# Patient Record
Sex: Female | Born: 1950 | Race: White | Hispanic: No | Smoking: Former smoker
Health system: Southern US, Community
[De-identification: ages and names within clinical notes are randomized; demographics above are authoritative.]

## PROBLEM LIST (undated history)

## (undated) DIAGNOSIS — Z973 Presence of spectacles and contact lenses: Secondary | ICD-10-CM

## (undated) DIAGNOSIS — M549 Dorsalgia, unspecified: Secondary | ICD-10-CM

## (undated) DIAGNOSIS — M2550 Pain in unspecified joint: Secondary | ICD-10-CM

## (undated) DIAGNOSIS — M5430 Sciatica, unspecified side: Secondary | ICD-10-CM

## (undated) DIAGNOSIS — Z86010 Personal history of colonic polyps: Secondary | ICD-10-CM

## (undated) DIAGNOSIS — E559 Vitamin D deficiency, unspecified: Secondary | ICD-10-CM

## (undated) DIAGNOSIS — Z923 Personal history of irradiation: Secondary | ICD-10-CM

## (undated) DIAGNOSIS — I471 Supraventricular tachycardia, unspecified: Secondary | ICD-10-CM

## (undated) DIAGNOSIS — Z860101 Personal history of adenomatous and serrated colon polyps: Secondary | ICD-10-CM

## (undated) DIAGNOSIS — H3530 Unspecified macular degeneration: Secondary | ICD-10-CM

## (undated) DIAGNOSIS — Z8619 Personal history of other infectious and parasitic diseases: Secondary | ICD-10-CM

## (undated) DIAGNOSIS — J189 Pneumonia, unspecified organism: Secondary | ICD-10-CM

## (undated) DIAGNOSIS — R002 Palpitations: Secondary | ICD-10-CM

## (undated) DIAGNOSIS — M1990 Unspecified osteoarthritis, unspecified site: Secondary | ICD-10-CM

## (undated) DIAGNOSIS — I499 Cardiac arrhythmia, unspecified: Secondary | ICD-10-CM

## (undated) HISTORY — DX: Palpitations: R00.2

## (undated) HISTORY — DX: Dorsalgia, unspecified: M54.9

## (undated) HISTORY — PX: OTHER SURGICAL HISTORY: SHX169

## (undated) HISTORY — DX: Pain in unspecified joint: M25.50

## (undated) HISTORY — DX: Sciatica, unspecified side: M54.30

## (undated) HISTORY — PX: APPENDECTOMY: SHX54

## (undated) HISTORY — DX: Personal history of other infectious and parasitic diseases: Z86.19

## (undated) HISTORY — PX: COLONOSCOPY: SHX174

## (undated) HISTORY — PX: AUGMENTATION MAMMAPLASTY: SUR837

## (undated) HISTORY — DX: Supraventricular tachycardia: I47.1

---

## 1969-01-13 HISTORY — PX: APPENDECTOMY: SHX54

## 1999-11-27 ENCOUNTER — Other Ambulatory Visit: Admission: RE | Admit: 1999-11-27 | Discharge: 1999-11-27 | Payer: Self-pay | Admitting: Obstetrics and Gynecology

## 1999-12-10 ENCOUNTER — Encounter: Payer: Self-pay | Admitting: Emergency Medicine

## 1999-12-10 ENCOUNTER — Emergency Department (HOSPITAL_COMMUNITY): Admission: EM | Admit: 1999-12-10 | Discharge: 1999-12-10 | Payer: Self-pay | Admitting: Emergency Medicine

## 2000-01-13 ENCOUNTER — Other Ambulatory Visit: Admission: RE | Admit: 2000-01-13 | Discharge: 2000-01-13 | Payer: Self-pay | Admitting: Plastic Surgery

## 2000-01-13 ENCOUNTER — Encounter: Admission: RE | Admit: 2000-01-13 | Discharge: 2000-01-13 | Payer: Self-pay | Admitting: Plastic Surgery

## 2000-01-13 ENCOUNTER — Encounter: Payer: Self-pay | Admitting: Plastic Surgery

## 2000-01-14 HISTORY — PX: BREAST ENHANCEMENT SURGERY: SHX7

## 2000-12-29 ENCOUNTER — Other Ambulatory Visit: Admission: RE | Admit: 2000-12-29 | Discharge: 2000-12-29 | Payer: Self-pay | Admitting: Obstetrics and Gynecology

## 2003-12-26 ENCOUNTER — Ambulatory Visit (HOSPITAL_COMMUNITY): Admission: RE | Admit: 2003-12-26 | Discharge: 2003-12-26 | Payer: Self-pay | Admitting: Surgery

## 2008-03-07 ENCOUNTER — Ambulatory Visit: Payer: Self-pay

## 2010-06-20 ENCOUNTER — Ambulatory Visit: Payer: Self-pay | Admitting: Orthopedic Surgery

## 2013-07-13 DIAGNOSIS — I471 Supraventricular tachycardia: Secondary | ICD-10-CM

## 2013-07-13 HISTORY — DX: Supraventricular tachycardia: I47.1

## 2013-07-18 ENCOUNTER — Emergency Department: Payer: Self-pay | Admitting: Emergency Medicine

## 2013-07-18 LAB — CBC
HCT: 41.3 % (ref 35.0–47.0)
HGB: 13.6 g/dL (ref 12.0–16.0)
MCH: 31 pg (ref 26.0–34.0)
MCHC: 32.8 g/dL (ref 32.0–36.0)
MCV: 94 fL (ref 80–100)
PLATELETS: 196 10*3/uL (ref 150–440)
RBC: 4.38 10*6/uL (ref 3.80–5.20)
RDW: 13.3 % (ref 11.5–14.5)
WBC: 7 10*3/uL (ref 3.6–11.0)

## 2013-07-18 LAB — COMPREHENSIVE METABOLIC PANEL
ALT: 34 U/L (ref 12–78)
ANION GAP: 6 — AB (ref 7–16)
AST: 35 U/L (ref 15–37)
Albumin: 3.7 g/dL (ref 3.4–5.0)
Alkaline Phosphatase: 79 U/L
BUN: 7 mg/dL (ref 7–18)
Bilirubin,Total: 0.7 mg/dL (ref 0.2–1.0)
CALCIUM: 9.3 mg/dL (ref 8.5–10.1)
CO2: 28 mmol/L (ref 21–32)
CREATININE: 0.94 mg/dL (ref 0.60–1.30)
Chloride: 101 mmol/L (ref 98–107)
EGFR (Non-African Amer.): >60
Glucose: 105 mg/dL — ABNORMAL HIGH (ref 65–99)
Osmolality: 268 (ref 275–301)
Potassium: 4.1 mmol/L (ref 3.5–5.1)
Sodium: 135 mmol/L — ABNORMAL LOW (ref 136–145)
Total Protein: 7.5 g/dL (ref 6.4–8.2)

## 2013-07-18 LAB — TROPONIN I: Troponin-I: <0.02 ng/mL

## 2013-07-18 LAB — D-DIMER(ARMC): D-Dimer: 312 ng/ml

## 2013-07-19 ENCOUNTER — Encounter: Payer: Self-pay | Admitting: Cardiovascular Disease

## 2013-07-19 ENCOUNTER — Ambulatory Visit (INDEPENDENT_AMBULATORY_CARE_PROVIDER_SITE_OTHER): Payer: 59 | Admitting: Cardiovascular Disease

## 2013-07-19 ENCOUNTER — Other Ambulatory Visit (INDEPENDENT_AMBULATORY_CARE_PROVIDER_SITE_OTHER): Payer: 59

## 2013-07-19 ENCOUNTER — Encounter: Payer: Self-pay | Admitting: *Deleted

## 2013-07-19 VITALS — BP 120/86 | HR 83 | Ht 62.0 in | Wt 161.8 lb

## 2013-07-19 DIAGNOSIS — R0602 Shortness of breath: Secondary | ICD-10-CM

## 2013-07-19 DIAGNOSIS — R079 Chest pain, unspecified: Secondary | ICD-10-CM

## 2013-07-19 DIAGNOSIS — I498 Other specified cardiac arrhythmias: Secondary | ICD-10-CM

## 2013-07-19 DIAGNOSIS — I471 Supraventricular tachycardia, unspecified: Secondary | ICD-10-CM

## 2013-07-19 DIAGNOSIS — R0609 Other forms of dyspnea: Secondary | ICD-10-CM

## 2013-07-19 DIAGNOSIS — R0989 Other specified symptoms and signs involving the circulatory and respiratory systems: Secondary | ICD-10-CM

## 2013-07-19 DIAGNOSIS — R0600 Dyspnea, unspecified: Secondary | ICD-10-CM

## 2013-07-19 NOTE — Progress Notes (Signed)
HPI  This is a pleasant 63 year old female who was referred from the emergency room at [facility] for evaluation of chest pain. 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 She reports remote history of paroxysmal supraventricular tachycardia previously treated with as needed metoprolol. She has no history of diabetes, hypertension or hyperlipidemia. She is a previous smoker and quit in 2000 and. She does have family history of coronary artery disease. Her father had myocardial infarction in his early 64s. Brother had CABG at the age of 55. She started having chest discomfort one week ago. It started as a substernal indigestion feeling and was associated with diaphoresis and shortness of breath as well as elevated heart rate. The chest pain was worse with breathing. Symptoms subsided without intervention. She had a similar episode on Sunday. She started taking Prilosec but the symptoms continued on Monday and thus she went to the emergency room at [facility] Cardiac enzymes were negative. ECG was unremarkable except for poor R-wave progression in the anterior leads. D-dimer was normal. The rest of her labs were unremarkable. Chest x-ray was normal  No Known Allergies   No current outpatient prescriptions on file prior to visit.   No current facility-administered medications on file prior to visit.     Past Medical History  Diagnosis Date  . SVT (supraventricular tachycardia)      Past Surgical History  Procedure Laterality Date  . Appendectomy       Family History  Problem Relation Age of Onset  . Hypertension Father   . Hyperlipidemia Father   . Heart attack Father   . Stroke Father      History   Social History  . Marital Status: Widowed    Spouse Name: N/A    Number of Children: N/A  . Years of Education: N/A   Occupational History  . Not on file.   Social History Main Topics  . Smoking status: Former Smoker -- 20 years    Types: Cigarettes  . Smokeless tobacco: Not on file  .  Alcohol Use: Yes     Comment: occasional  . Drug Use: No  . Sexual Activity: Not on file   Other Topics Concern  . Not on file   Social History Narrative  . No narrative on file     ROS A 10 point review of system was performed. It is negative other than that mentioned in the history of present illness.   PHYSICAL EXAM   BP 120/86  Pulse 83  Ht 5\' 2"  (1.575 m)  Wt 161 lb 12 oz (73.369 kg)  BMI 29.58 kg/m2 Constitutional: She is oriented to person, place, and time. She appears well-developed and well-nourished. No distress.  HENT: No nasal discharge.  Head: Normocephalic and atraumatic.  Eyes: Pupils are equal and round. No discharge.  Neck: Normal range of motion. Neck supple. No JVD present. No thyromegaly present.  Cardiovascular: Normal rate, regular rhythm, normal heart sounds. Exam reveals no gallop and no friction rub. No murmur heard.  Pulmonary/Chest: Effort normal and breath sounds normal. No stridor. No respiratory distress. She has no wheezes. She has no rales. She exhibits no tenderness.  Abdominal: Soft. Bowel sounds are normal. She exhibits no distension. There is no tenderness. There is no rebound and no guarding.  Musculoskeletal: Normal range of motion. She exhibits no edema and no tenderness.  Neurological: She is alert and oriented to person, place, and time. Coordination normal.  Skin: Skin is warm and dry. No rash noted.  She is not diaphoretic. No erythema. No pallor.  Psychiatric: She has a normal mood and affect. Her behavior is normal. Judgment and thought content normal.     EKG: Normal sinus rhythm with poor R-wave progression in the anterior leads   ASSESSMENT AND PLAN

## 2013-07-19 NOTE — Patient Instructions (Signed)
Your physician has requested that you have an exercise tolerance test. For further information please visit [email or web address]. Please also follow instruction sheet, as given.   

## 2013-07-19 NOTE — Patient Instructions (Addendum)
Your stress test is normal.   Your physician has requested that you have an echocardiogram. Echocardiography is a painless test that uses sound waves to create images of your heart. It provides your doctor with information about the size and shape of your heart and how well your heart's chambers and valves are working. This procedure takes approximately one hour. There are no restrictions for this procedure.   Follow up in 1 week.

## 2013-07-19 NOTE — Procedures (Signed)
   Treadmill Stress test  Indication: Chest pain  Baseline Data:  Resting EKG shows NSR with rate of 100 bpm, no significant ST or T wave changes. Poor R-wave progression in the anterior leads Resting blood pressure of 112/92 mm Hg Stand bruce protocal was used.  Exercise Data:  Patient exercised for 5 min 22 sec,  Peak heart rate of 150 bpm.  This was 96 % of the maximum predicted heart rate. No symptoms of chest pain or lightheadedness were reported at peak stress or in recovery.  Peak Blood pressure recorded was 166/81 Maximal work level: 7 METs.  Heart rate at 3 minutes in recovery was 105 bpm. BP response: Normal HR response: Normal  EKG with Exercise: Sinus tachycardia and 0.5. upsloping ST depression in the inferior leads which is not diagnostic for ischemia.  FINAL IMPRESSION: Normal exercise stress test. No significant EKG changes concerning for ischemia. Average exercise tolerance.  Recommendation: Proceed with an echocardiogram.

## 2013-07-19 NOTE — Assessment & Plan Note (Signed)
New onset of chest pain of unclear etiology at the present time. Some of the symptoms are worrisome for cardiac  cause. There is also a pleuritic component. D-dimer was normal and thus pulmonary embolism is unlikely. ECG without ST or T wave changes but she does have poor R-wave progression in the precordial leads.  I proceeded with a treadmill stress test which showed no evidence of ischemia. She did not have chest pain during the stress test except for discomfort with breathing.  Due to that, I recommend an echocardiogram. Continue treatment for possible GERD. I want her to followup closely with me in one week.

## 2013-07-25 ENCOUNTER — Encounter: Payer: Self-pay | Admitting: Cardiovascular Disease

## 2013-07-25 ENCOUNTER — Ambulatory Visit (INDEPENDENT_AMBULATORY_CARE_PROVIDER_SITE_OTHER): Payer: Medicare Other | Admitting: Cardiovascular Disease

## 2013-07-25 VITALS — BP 110/86 | HR 78 | Ht 62.0 in | Wt 158.5 lb

## 2013-07-25 DIAGNOSIS — K219 Gastro-esophageal reflux disease without esophagitis: Secondary | ICD-10-CM

## 2013-07-25 DIAGNOSIS — I471 Supraventricular tachycardia, unspecified: Secondary | ICD-10-CM | POA: Insufficient documentation

## 2013-07-25 NOTE — Progress Notes (Signed)
HPI  This is a pleasant 63 year old female who is here today for a follow up visit regarding chest pain. 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 She has a history of paroxysmal supraventricular tachycardia previously treated with as needed metoprolol. She has no history of diabetes, hypertension or hyperlipidemia. She is a previous smoker and quit in 2010. She does have family history of coronary artery disease. Her father had myocardial infarction in his early 87s. Brother had CABG at the age of 25. She was seen recently for substernal chest indigestion feeling with associated with diaphoresis and shortness of breath as well as elevated heart rate. The chest pain was worse with breathing. She underwent a treadmill stress test which was normal. Echocardiogram was unremarkable.  Symptoms resolved after she started taking omeprazole. She reports one episode of tachycardia recently addressed with a heart rate around 125 beats per minute. It resolved with vagal maneuver.    No Known Allergies   Current Outpatient Prescriptions on File Prior to Visit  Medication Sig Dispense Refill  . omeprazole (PRILOSEC) 20 MG capsule Take 20 mg by mouth daily.       No current facility-administered medications on file prior to visit.     Past Medical History  Diagnosis Date  . SVT (supraventricular tachycardia)      Past Surgical History  Procedure Laterality Date  . Appendectomy       Family History  Problem Relation Age of Onset  . Hypertension Father   . Hyperlipidemia Father   . Heart attack Father   . Stroke Father      History   Social History  . Marital Status: Widowed    Spouse Name: N/A    Number of Children: N/A  . Years of Education: N/A   Occupational History  . Not on file.   Social History Main Topics  . Smoking status: Former Smoker -- 20 years    Types: Cigarettes  . Smokeless tobacco: Not on file  . Alcohol Use: Yes     Comment: occasional  . Drug Use: No  .  Sexual Activity: Not on file   Other Topics Concern  . Not on file   Social History Narrative  . No narrative on file     ROS A 10 point review of system was performed. It is negative other than that mentioned in the history of present illness.   PHYSICAL EXAM   BP 110/86  Pulse 78  Ht 5\' 2"  (1.575 m)  Wt 158 lb 8 oz (71.895 kg)  BMI 28.98 kg/m2 Constitutional: She is oriented to person, place, and time. She appears well-developed and well-nourished. No distress.  HENT: No nasal discharge.  Head: Normocephalic and atraumatic.  Eyes: Pupils are equal and round. No discharge.  Neck: Normal range of motion. Neck supple. No JVD present. No thyromegaly present.  Cardiovascular: Normal rate, regular rhythm, normal heart sounds. Exam reveals no gallop and no friction rub. No murmur heard.  Pulmonary/Chest: Effort normal and breath sounds normal. No stridor. No respiratory distress. She has no wheezes. She has no rales. She exhibits no tenderness.  Abdominal: Soft. Bowel sounds are normal. She exhibits no distension. There is no tenderness. There is no rebound and no guarding.  Musculoskeletal: Normal range of motion. She exhibits no edema and no tenderness.  Neurological: She is alert and oriented to person, place, and time. Coordination normal.  Skin: Skin is warm and dry. No rash noted. She is not diaphoretic. No erythema.  No pallor.  Psychiatric: She has a normal mood and affect. Her behavior is normal. Judgment and thought content normal.       ASSESSMENT AND PLAN

## 2013-07-25 NOTE — Assessment & Plan Note (Signed)
Episodes are very brief and not frequent. They usually terminate with vagal maneuvers.  If the episodes become more frequent, I recommend a 30 day outpatient telemetry.

## 2013-07-25 NOTE — Patient Instructions (Signed)
Your physician wants you to follow-up in:  12 months.  You will receive a reminder letter in the mail two months in advance. If you don't receive a letter, please call our office to schedule the follow-up appointment.   

## 2013-07-25 NOTE — Assessment & Plan Note (Signed)
It seems that her chest pain was likely due to GERD. Symptoms resolved after she started taking omeprazole. Cardiac workup was unremarkable.

## 2013-11-29 ENCOUNTER — Ambulatory Visit (AMBULATORY_SURGERY_CENTER): Payer: Self-pay

## 2013-11-29 VITALS — Ht 62.0 in | Wt 157.6 lb

## 2013-11-29 DIAGNOSIS — Z1211 Encounter for screening for malignant neoplasm of colon: Secondary | ICD-10-CM

## 2013-11-29 MED ORDER — MOVIPREP 100 G PO SOLR: ORAL | Status: DC

## 2013-11-29 NOTE — Progress Notes (Signed)
Pt states she had a colonoscopy done over 15 years ago in [place] (normal) ,but does not remember the doctor's name.    Per pt, no allergies to soy or egg products.Pt not taking any weight loss meds or using  O2 at home.

## 2013-12-13 ENCOUNTER — Ambulatory Visit (AMBULATORY_SURGERY_CENTER): Payer: 59 | Admitting: Gastroenterology

## 2013-12-13 ENCOUNTER — Encounter: Payer: Self-pay | Admitting: Gastroenterology

## 2013-12-13 VITALS — BP 141/71 | HR 58 | Temp 97.7°F | Resp 18 | Ht 62.0 in | Wt 157.0 lb

## 2013-12-13 DIAGNOSIS — D122 Benign neoplasm of ascending colon: Secondary | ICD-10-CM

## 2013-12-13 DIAGNOSIS — Z1211 Encounter for screening for malignant neoplasm of colon: Secondary | ICD-10-CM

## 2013-12-13 MED ORDER — SODIUM CHLORIDE 0.9 % IV SOLN: 500.0000 mL | INTRAVENOUS | Status: DC

## 2013-12-13 NOTE — Op Note (Signed)
[place]  [address] [place], [address]   COLONOSCOPY PROCEDURE REPORT  PATIENT: Kimberly Klein, Kimberly Klein  MR#: [id number] BIRTHDATE: 05-31-1950 , 4  yrs. old GENDER: female ENDOSCOPIST: Milus Banister, MD REFERRED BY: Park Liter, MD PROCEDURE DATE:  12/13/2013 PROCEDURE:   Colonoscopy with snare polypectomy First Screening Colonoscopy - Avg.  risk and is 50 yrs.  old or older Yes.  Prior Negative Screening - Now for repeat screening. N/A  History of Adenoma - Now for follow-up colonoscopy & has been > or = to 3 yrs.  N/A  Polyps Removed Today? Yes. ASA CLASS:   Class II INDICATIONS:average risk for colon cancer. MEDICATIONS: Monitored anesthesia care and Propofol 370 mg IV  DESCRIPTION OF PROCEDURE:   After the risks benefits and alternatives of the procedure were thoroughly explained, informed consent was obtained.  The digital rectal exam revealed no abnormalities of the rectum.   The LB [id number]  endoscope was introduced through the anus and advanced to the cecum, which was identified by both the appendix and ileocecal valve. No adverse events experienced.   The quality of the prep was excellent.  The instrument was then slowly withdrawn as the colon was fully examined.   COLON FINDINGS: One polyp was found, removed and sent to pathology. This was sessile, 54mm across, located in ascending segment, removed with cold snare.  The colon was quite redundant, tortuous.  The examination was otherwise normal.  Retroflexed views revealed no abnormalities. The time to cecum=26 minutes 04 seconds.  Withdrawal time=6 minutes 22 seconds.  The scope was withdrawn and the procedure completed. COMPLICATIONS: There were no immediate complications.  ENDOSCOPIC IMPRESSION: One polyp was found, removed and sent to pathology.  This was sessile, 66mm across, located in ascending segment, removed with cold snare.  The colon was quite redundant, tortuous.   The examination was otherwise normal  RECOMMENDATIONS: If the polyp(s) removed today are proven to be adenomatous (pre-cancerous) polyps, you will need a repeat colonoscopy in 5 years.  Otherwise you should continue to follow colorectal cancer screening guidelines for "routine risk" patients with colonoscopy in 10 years.  You will receive a letter within 1-2 weeks with the results of your biopsy as well as final recommendations.  Please call my office if you have not received a letter after 3 weeks.  eSigned:  Milus Banister, MD 12/13/2013 10:46 AM

## 2013-12-13 NOTE — Progress Notes (Signed)
Report to PACU, RN, vss, BBS= Clear.  

## 2013-12-13 NOTE — Progress Notes (Signed)
Called to room to assist during endoscopic procedure.  Patient ID and intended procedure confirmed with present staff. Received instructions for my participation in the procedure from the performing physician.  

## 2013-12-13 NOTE — Patient Instructions (Addendum)
YOU HAD AN ENDOSCOPIC PROCEDURE TODAY AT THE [facility]: Refer to the procedure report that was given to you for any specific questions about what was found during the examination.  If the procedure report does not answer your questions, please call your gastroenterologist to clarify.  If you requested that your care partner not be given the details of your procedure findings, then the procedure report has been included in a sealed envelope for you to review at your convenience later.  YOU SHOULD EXPECT: Some feelings of bloating in the abdomen. Passage of more gas than usual.  Walking can help get rid of the air that was put into your GI tract during the procedure and reduce the bloating. If you had a lower endoscopy (such as a colonoscopy or flexible sigmoidoscopy) you may notice spotting of blood in your stool or on the toilet paper. If you underwent a bowel prep for your procedure, then you may not have a normal bowel movement for a few days.  DIET: Your first meal following the procedure should be a light meal and then it is ok to progress to your normal diet.  A half-sandwich or bowl of soup is an example of a good first meal.  Heavy or fried foods are harder to digest and may make you feel nauseous or bloated.  Likewise meals heavy in dairy and vegetables can cause extra gas to form and this can also increase the bloating.  Drink plenty of fluids but you should avoid alcoholic beverages for 24 hours.  ACTIVITY: Your care partner should take you home directly after the procedure.  You should plan to take it easy, moving slowly for the rest of the day.  You can resume normal activity the day after the procedure however you should NOT DRIVE or use heavy machinery for 24 hours (because of the sedation medicines used during the test).    SYMPTOMS TO REPORT IMMEDIATELY: A gastroenterologist can be reached at any hour.  During normal business hours, 8:30 AM to 5:00 PM Monday through Friday,  call [phone].  After hours and on weekends, please call the GI answering service at [phone] who will take a message and have the physician on call contact you.   Following lower endoscopy (colonoscopy or flexible sigmoidoscopy):  Excessive amounts of blood in the stool  Significant tenderness or worsening of abdominal pains  Swelling of the abdomen that is new, acute  Fever of 100F or higher  FOLLOW UP: If any biopsies were taken you will be contacted by phone or by letter within the next 1-3 weeks.  Call your gastroenterologist if you have not heard about the biopsies in 3 weeks.  Our staff will call the home number listed on your records the next business day following your procedure to check on you and address any questions or concerns that you may have at that time regarding the information given to you following your procedure. This is a courtesy call and so if there is no answer at the home number and we have not heard from you through the emergency physician on call, we will assume that you have returned to your regular daily activities without incident.  SIGNATURES/CONFIDENTIALITY: You and/or your care partner have signed paperwork which will be entered into your electronic medical record.  These signatures attest to the fact that that the information above on your After Visit Summary has been reviewed and is understood.  Full responsibility of the confidentiality of this   discharge information lies with you and/or your care-partner.    Resume medications. Information given on polyps with discharge instructions. 

## 2013-12-14 ENCOUNTER — Telehealth: Payer: Self-pay | Admitting: *Deleted

## 2013-12-14 NOTE — Telephone Encounter (Signed)
  Follow up Call-  Call back number 12/13/2013  Post procedure Call Back phone  # [phone]  Permission to leave phone message Yes     Patient questions:  Do you have a fever, pain , or abdominal swelling? No. Pain Score  0 *  Have you tolerated food without any problems? Yes.    Have you been able to return to your normal activities? Yes.    Do you have any questions about your discharge instructions: Diet   No. Medications  No. Follow up visit  No.  Do you have questions or concerns about your Care? No.  Actions: * If pain score is 4 or above: No action needed, pain <4.

## 2013-12-21 ENCOUNTER — Encounter: Payer: Self-pay | Admitting: Gastroenterology

## 2013-12-23 HISTORY — PX: COLONOSCOPY WITH PROPOFOL: SHX5780

## 2014-04-24 ENCOUNTER — Encounter: Payer: Self-pay | Admitting: *Deleted

## 2014-11-07 ENCOUNTER — Telehealth: Payer: Self-pay | Admitting: Cardiovascular Disease

## 2014-11-07 NOTE — Telephone Encounter (Signed)
3 attempts made to schedule OD fu from recall.  LMOV to call office for scheduling  ° ° °Deleting Recall.   °

## 2015-03-08 ENCOUNTER — Ambulatory Visit (INDEPENDENT_AMBULATORY_CARE_PROVIDER_SITE_OTHER): Payer: PPO | Admitting: Family Medicine

## 2015-03-08 ENCOUNTER — Encounter: Payer: Self-pay | Admitting: Family Medicine

## 2015-03-08 ENCOUNTER — Ambulatory Visit
Admission: RE | Admit: 2015-03-08 | Discharge: 2015-03-08 | Disposition: A | Discharge status: Home / Self Care | Payer: PPO | Source: Ambulatory Visit | Attending: Family Medicine | Admitting: Family Medicine

## 2015-03-08 VITALS — BP 162/91 | HR 72 | Temp 99.5°F | Ht 61.4 in | Wt 159.0 lb

## 2015-03-08 DIAGNOSIS — IMO0001 Reserved for inherently not codable concepts without codable children: Secondary | ICD-10-CM

## 2015-03-08 DIAGNOSIS — M546 Pain in thoracic spine: Secondary | ICD-10-CM

## 2015-03-08 DIAGNOSIS — M50122 Cervical disc disorder at C5-C6 level with radiculopathy: Secondary | ICD-10-CM | POA: Insufficient documentation

## 2015-03-08 DIAGNOSIS — M5134 Other intervertebral disc degeneration, thoracic region: Secondary | ICD-10-CM | POA: Diagnosis not present

## 2015-03-08 DIAGNOSIS — M50323 Other cervical disc degeneration at C6-C7 level: Secondary | ICD-10-CM | POA: Diagnosis not present

## 2015-03-08 DIAGNOSIS — R030 Elevated blood-pressure reading, without diagnosis of hypertension: Secondary | ICD-10-CM

## 2015-03-08 DIAGNOSIS — M47892 Other spondylosis, cervical region: Secondary | ICD-10-CM | POA: Diagnosis not present

## 2015-03-08 DIAGNOSIS — M50322 Other cervical disc degeneration at C5-C6 level: Secondary | ICD-10-CM | POA: Diagnosis not present

## 2015-03-08 MED ORDER — HYDROCODONE-ACETAMINOPHEN 5-325 MG PO TABS: 1.0000 | ORAL_TABLET | Freq: Four times a day (QID) | ORAL | Status: DC | PRN

## 2015-03-08 MED ORDER — CYCLOBENZAPRINE HCL 10 MG PO TABS: 10.0000 mg | ORAL_TABLET | Freq: Three times a day (TID) | ORAL | Status: DC | PRN

## 2015-03-08 NOTE — Progress Notes (Signed)
BP 162/91 mmHg  Pulse 72  Temp(Src) 99.5 F (37.5 C)  Ht 5' 1.4" (1.56 m)  Wt 159 lb (72.122 kg)  BMI 29.64 kg/m2  SpO2 99%   Subjective:    Patient ID: Manus Rudd, female    DOB: [birth year], 65 y.o.   MRN: [id number]  HPI: HSER BELANGER is a 65 y.o. female  Chief Complaint  Patient presents with  . Back Pain   ELEVATED BLOOD PRESSURE Duration of elevated BP: unknown BP monitoring frequency: rarely Previous BP meds: no Recent stressors: no Family history of hypertension: yes Recurrent headaches: no Visual changes: no Palpitations: no  Dyspnea: no Chest pain: no Lower extremity edema: no Dizzy/lightheaded: no Transient ischemic attacks: no  BACK PAIN- tripped over a gate on Sunday, fell on her knees, didn't hurt herself, played pickle ball the next day, Tuesday had a shooting pain down her R am with sever pain Duration: Tueday Mechanism of injury: trip and fall Location: R>L and upper back Onset: sudden Severity: 7/10 Quality: sharp, aching and burning Frequency: constant Radiation: R arm  Aggravating factors: lifting, movement and walking Alleviating factors: ice and NSAIDs Status: worse Treatments attempted: rest, ice and ibuprofen  Relief with NSAIDs?: mild Nighttime pain:  yes Paresthesias / decreased sensation:  yes Bowel / bladder incontinence:  no Fevers:  no Dysuria / urinary frequency:  no  Relevant past medical, surgical, family and social history reviewed and updated as indicated. Interim medical history since our last visit reviewed. Allergies and medications reviewed and updated.  Review of Systems  Constitutional: Negative.   Respiratory: Negative.   Cardiovascular: Negative.   Musculoskeletal: Positive for myalgias, neck pain and neck stiffness. Negative for back pain, joint swelling, arthralgias and gait problem.  Psychiatric/Behavioral: Negative.     Per HPI unless specifically indicated above     Objective:    BP 162/91  mmHg  Pulse 72  Temp(Src) 99.5 F (37.5 C)  Ht 5' 1.4" (1.56 m)  Wt 159 lb (72.122 kg)  BMI 29.64 kg/m2  SpO2 99%  Wt Readings from Last 3 Encounters:  03/08/15 159 lb (72.122 kg)  12/13/13 157 lb (71.215 kg)  11/29/13 157 lb 9.6 oz (71.487 kg)    Physical Exam  Constitutional: She is oriented to person, place, and time. She appears well-developed and well-nourished. No distress.  HENT:  Head: Normocephalic and atraumatic.  Right Ear: Hearing normal.  Left Ear: Hearing normal.  Nose: Nose normal.  Eyes: Conjunctivae and lids are normal. Right eye exhibits no discharge. Left eye exhibits no discharge. No scleral icterus.  Cardiovascular: Normal rate, regular rhythm, normal heart sounds and intact distal pulses.  Exam reveals no gallop and no friction rub.   No murmur heard. Pulmonary/Chest: Effort normal and breath sounds normal. No respiratory distress. She has no wheezes. She has no rales. She exhibits no tenderness.  Musculoskeletal:  Contracture of 2nd and 3rd fingers on the R, weak grip strength on the R, otherwise normal strength, normal ROM, hypertonicity of the periscapular muscles on the R  Neurological: She is alert and oriented to person, place, and time. She displays normal reflexes. She exhibits abnormal muscle tone.  Sensation intact throughout upper extremities, DTRs normal.   Skin: Skin is warm, dry and intact. No rash noted. She is not diaphoretic. No erythema. No pallor.  Psychiatric: She has a normal mood and affect. Her speech is normal and behavior is normal. Judgment and thought content normal. Cognition and memory are normal.  Nursing note and vitals reviewed.   Results for orders placed or performed in visit on 07/18/13  Comprehensive metabolic panel  Result Value Ref Range   Glucose 105 (H) 65-99 mg/dL   BUN 7 7-18 mg/dL   Creatinine 0.94 0.60-1.30 mg/dL   Sodium 135 (L) 136-145 mmol/L   Potassium 4.1 3.5-5.1 mmol/L   Chloride 101 98-107 mmol/L   Co2  28 21-32 mmol/L   Calcium, Total 9.3 8.5-10.1 mg/dL   SGOT(AST) 35 15-37 Unit/L   SGPT (ALT) 34 12-78 U/L   Alkaline Phosphatase 79 Unit/L   Albumin 3.7 3.4-5.0 g/dL   Total Protein 7.5 6.4-8.2 g/dL   Bilirubin,Total 0.7 0.2-1.0 mg/dL   Osmolality 268 275-301   Anion Gap 6 (L) 7-16   EGFR (African American) >60    EGFR (Non-African Amer.) >60   CBC  Result Value Ref Range   WBC 7.0 3.6-11.0 x10 3/mm 3   RBC 4.38 3.80-5.20 X10 6/mm 3   HGB 13.6 12.0-16.0 g/dL   HCT 41.3 35.0-47.0 %   MCV 94 80-100 fL   MCH 31.0 26.0-34.0 pg   MCHC 32.8 32.0-36.0 g/dL   RDW 13.3 11.5-14.5 %   Platelet 196 150-440 x10 3/mm 3  Troponin I  Result Value Ref Range   Troponin-I < 0.02 ng/mL  D-Dimer [facility])  Result Value Ref Range   D-Dimer 312 ng/ml      Assessment & Plan:   Problem List Items Addressed This Visit    None    Visit Diagnoses    Right-sided thoracic back pain    -  Primary    Significant concern for possible radiculopathy with the abnormal neurologic exam. Will start muscle relaxer and pain med. X-rays now. follow up pending results    Relevant Medications    cyclobenzaprine (FLEXERIL) 10 MG tablet    HYDROcodone-acetaminophen (NORCO/VICODIN) 5-325 MG tablet    Other Relevant Orders    DG Cervical Spine Complete    DG Thoracic Spine W/Swimmers    Elevated BP        Possibly due to NSAID use. Will hold on NSAIDs and work on [employer]. Monitor at home and call with concerns.         Follow up plan: Return Pending results.

## 2015-03-08 NOTE — Patient Instructions (Signed)
DASH Eating Plan  DASH stands for "Dietary Approaches to Stop Hypertension." The DASH eating plan is a healthy eating plan that has been shown to reduce high blood pressure (hypertension). Additional health benefits may include reducing the risk of type 2 diabetes mellitus, heart disease, and stroke. The DASH eating plan may also help with weight loss.  WHAT DO I NEED TO KNOW ABOUT THE DASH EATING PLAN?  For the DASH eating plan, you will follow these general guidelines:  · Choose foods with a percent daily value for sodium of less than 5% (as listed on the food label).  · Use salt-free seasonings or herbs instead of table salt or sea salt.  · Check with your health care provider or pharmacist before using salt substitutes.  · Eat lower-sodium products, often labeled as "lower sodium" or "no salt added."  · Eat fresh foods.  · Eat more vegetables, fruits, and low-fat dairy products.  · Choose whole grains. Look for the word "whole" as the first word in the ingredient list.  · Choose fish and skinless chicken or turkey more often than red meat. Limit fish, poultry, and meat to 6 oz (170 g) each day.  · Limit sweets, desserts, sugars, and sugary drinks.  · Choose heart-healthy fats.  · Limit cheese to 1 oz (28 g) per day.  · Eat more home-cooked food and less restaurant, buffet, and fast food.  · Limit fried foods.  · Cook foods using methods other than frying.  · Limit canned vegetables. If you do use them, rinse them well to decrease the sodium.  · When eating at a restaurant, ask that your food be prepared with less salt, or no salt if possible.  WHAT FOODS CAN I EAT?  Seek help from a dietitian for individual calorie needs.  Grains  Whole grain or whole wheat bread. Brown rice. Whole grain or whole wheat pasta. Quinoa, bulgur, and whole grain cereals. Low-sodium cereals. Corn or whole wheat flour tortillas. Whole grain cornbread. Whole grain crackers. Low-sodium crackers.  Vegetables  Fresh or frozen vegetables  (raw, steamed, roasted, or grilled). Low-sodium or reduced-sodium tomato and vegetable juices. Low-sodium or reduced-sodium tomato sauce and paste. Low-sodium or reduced-sodium canned vegetables.   Fruits  All fresh, canned (in natural juice), or frozen fruits.  Meat and Other Protein Products  Ground beef (85% or leaner), grass-fed beef, or beef trimmed of fat. Skinless chicken or turkey. Ground chicken or turkey. Pork trimmed of fat. All fish and seafood. Eggs. Dried beans, peas, or lentils. Unsalted nuts and seeds. Unsalted canned beans.  Dairy  Low-fat dairy products, such as skim or 1% milk, 2% or reduced-fat cheeses, low-fat ricotta or cottage cheese, or plain low-fat yogurt. Low-sodium or reduced-sodium cheeses.  Fats and Oils  Tub margarines without trans fats. Light or reduced-fat mayonnaise and salad dressings (reduced sodium). Avocado. Safflower, olive, or canola oils. Natural peanut or almond butter.  Other  Unsalted popcorn and pretzels.  The items listed above may not be a complete list of recommended foods or beverages. Contact your dietitian for more options.  WHAT FOODS ARE NOT RECOMMENDED?  Grains  White bread. White pasta. White rice. Refined cornbread. Bagels and croissants. Crackers that contain trans fat.  Vegetables  Creamed or fried vegetables. Vegetables in a cheese sauce. Regular canned vegetables. Regular canned tomato sauce and paste. Regular tomato and vegetable juices.  Fruits  Dried fruits. Canned fruit in light or heavy syrup. Fruit juice.  Meat and Other Protein   Products  Fatty cuts of meat. Ribs, chicken wings, bacon, sausage, bologna, salami, chitterlings, fatback, hot dogs, bratwurst, and packaged luncheon meats. Salted nuts and seeds. Canned beans with salt.  Dairy  Whole or 2% milk, cream, half-and-half, and cream cheese. Whole-fat or sweetened yogurt. Full-fat cheeses or blue cheese. Nondairy creamers and whipped toppings. Processed cheese, cheese spreads, or cheese  curds.  Condiments  Onion and garlic salt, seasoned salt, table salt, and sea salt. Canned and packaged gravies. Worcestershire sauce. Tartar sauce. Barbecue sauce. Teriyaki sauce. Soy sauce, including reduced sodium. Steak sauce. Fish sauce. Oyster sauce. Cocktail sauce. Horseradish. Ketchup and mustard. Meat flavorings and tenderizers. Bouillon cubes. Hot sauce. Tabasco sauce. Marinades. Taco seasonings. Relishes.  Fats and Oils  Butter, stick margarine, lard, shortening, ghee, and bacon fat. Coconut, palm kernel, or palm oils. Regular salad dressings.  Other  Pickles and olives. Salted popcorn and pretzels.  The items listed above may not be a complete list of foods and beverages to avoid. Contact your dietitian for more information.  WHERE CAN I FIND MORE INFORMATION?  National Heart, Lung, and [employer] Institute: www.nhlbi.nih.gov/health/health-topics/topics/dash/     This information is not intended to replace advice given to you by your health care provider. Make sure you discuss any questions you have with your health care provider.     Document Released: 12/19/2010 Document Revised: 01/20/2014 Document Reviewed: 11/03/2012  Elsevier Interactive Patient Education ©2016 [employer]vier Inc.

## 2015-03-09 ENCOUNTER — Telehealth: Payer: Self-pay | Admitting: Family Medicine

## 2015-03-09 DIAGNOSIS — M5412 Radiculopathy, cervical region: Secondary | ICD-10-CM

## 2015-03-09 MED ORDER — PREDNISONE 10 MG PO TABS: ORAL_TABLET | ORAL | Status: DC

## 2015-03-09 NOTE — Telephone Encounter (Signed)
Pt called stating the meds Dr Wynetta Emery gave her are not helping, she is still in a lot of pain.  Wondering if maybe she should get an x-ray or what else can be done.  Pt would like Dr Wynetta Emery to please call her.

## 2015-03-09 NOTE — Telephone Encounter (Signed)
Routed to MJ 

## 2015-03-09 NOTE — Telephone Encounter (Signed)
See other phone note with results of x-ray. Already spoke to patient.

## 2015-03-09 NOTE — Telephone Encounter (Signed)
Called and gave patient her results. Will start prednisone taper. Obtain MRI. She would like to see ortho through [facility] in [place], but will get back to [place] with who she'd like to see.

## 2015-03-10 ENCOUNTER — Encounter: Payer: Self-pay | Admitting: Family Medicine

## 2015-03-10 DIAGNOSIS — M5412 Radiculopathy, cervical region: Secondary | ICD-10-CM

## 2015-03-12 ENCOUNTER — Telehealth: Payer: Self-pay | Admitting: Family Medicine

## 2015-03-12 NOTE — Telephone Encounter (Signed)
Pt would like know the status of her MRI. There is an order there and would like to know if she can call and schedule it or if she needs to wait for scheduling to call her.

## 2015-03-13 ENCOUNTER — Encounter: Payer: Self-pay | Admitting: Family Medicine

## 2015-03-13 NOTE — Telephone Encounter (Signed)
MRI scheduled 03/15/2015 and authorization obtained.   Patient notified.

## 2015-03-14 ENCOUNTER — Telehealth: Payer: Self-pay | Admitting: Family Medicine

## 2015-03-14 ENCOUNTER — Encounter: Payer: Self-pay | Admitting: Family Medicine

## 2015-03-14 DIAGNOSIS — M5412 Radiculopathy, cervical region: Secondary | ICD-10-CM

## 2015-03-14 NOTE — Telephone Encounter (Signed)
Patient stated that [facility] cant see her until the end of March but patient called [facility] and they said they can see her next week. Patient wants to change referral place if possible, thanks.

## 2015-03-14 NOTE — Telephone Encounter (Signed)
I don't think I put in a specific neurologist. Can you change this or do you need me to?

## 2015-03-15 ENCOUNTER — Ambulatory Visit
Admission: RE | Admit: 2015-03-15 | Discharge: 2015-03-15 | Disposition: A | Discharge status: Home / Self Care | Payer: PPO | Source: Ambulatory Visit | Attending: Family Medicine | Admitting: Family Medicine

## 2015-03-15 DIAGNOSIS — M50223 Other cervical disc displacement at C6-C7 level: Secondary | ICD-10-CM | POA: Diagnosis not present

## 2015-03-15 DIAGNOSIS — Q7649 Other congenital malformations of spine, not associated with scoliosis: Secondary | ICD-10-CM | POA: Diagnosis not present

## 2015-03-15 DIAGNOSIS — M5412 Radiculopathy, cervical region: Secondary | ICD-10-CM

## 2015-03-15 DIAGNOSIS — G9589 Other specified diseases of spinal cord: Secondary | ICD-10-CM | POA: Insufficient documentation

## 2015-03-15 DIAGNOSIS — M4802 Spinal stenosis, cervical region: Secondary | ICD-10-CM | POA: Insufficient documentation

## 2015-03-15 DIAGNOSIS — M5023 Other cervical disc displacement, cervicothoracic region: Secondary | ICD-10-CM | POA: Insufficient documentation

## 2015-03-15 DIAGNOSIS — M50222 Other cervical disc displacement at C5-C6 level: Secondary | ICD-10-CM | POA: Diagnosis not present

## 2015-03-15 DIAGNOSIS — M50221 Other cervical disc displacement at C4-C5 level: Secondary | ICD-10-CM | POA: Diagnosis not present

## 2015-03-15 MED ORDER — HYDROCODONE-ACETAMINOPHEN 5-325 MG PO TABS: 1.0000 | ORAL_TABLET | Freq: Four times a day (QID) | ORAL | Status: DC | PRN

## 2015-03-15 NOTE — Telephone Encounter (Signed)
Referral in- can you please let patient know that she'll have to cancel the appointment at [facility]?

## 2015-03-15 NOTE — Telephone Encounter (Signed)
Can you put in a new referral? Patient will have to call [facility] and cancel the appointment. That referral has already been authorized and scheduled.  In the new referral I can put in [facility].

## 2015-03-15 NOTE — Telephone Encounter (Signed)
Patient notified.   Patient also said she is worse. She said with her pain medication, her pain level is the same as it was without the pain medication. Her CT is today.  Will mark high priority on patient's referral to [facility].

## 2015-03-15 NOTE — Telephone Encounter (Signed)
Rx written for her to pick up tomorrow.

## 2015-03-16 ENCOUNTER — Telehealth: Payer: Self-pay | Admitting: Family Medicine

## 2015-03-16 DIAGNOSIS — M5412 Radiculopathy, cervical region: Secondary | ICD-10-CM

## 2015-03-16 MED ORDER — PREDNISONE 10 MG PO TABS: ORAL_TABLET | ORAL | Status: DC

## 2015-03-16 NOTE — Telephone Encounter (Signed)
Called and discussed results of MRI with Kimberly Klein. She is not sure if prednisone helped, but she is feeling horrible. Will restart prednisone. Seeing neurology on Monday.

## 2015-03-19 ENCOUNTER — Encounter: Payer: Self-pay | Admitting: Neurology

## 2015-03-19 ENCOUNTER — Ambulatory Visit (INDEPENDENT_AMBULATORY_CARE_PROVIDER_SITE_OTHER): Payer: PPO | Admitting: Neurology

## 2015-03-19 VITALS — BP 140/96 | HR 76 | Ht 61.4 in | Wt 160.0 lb

## 2015-03-19 DIAGNOSIS — M5020 Other cervical disc displacement, unspecified cervical region: Secondary | ICD-10-CM | POA: Insufficient documentation

## 2015-03-19 DIAGNOSIS — M4802 Spinal stenosis, cervical region: Secondary | ICD-10-CM | POA: Diagnosis not present

## 2015-03-19 DIAGNOSIS — M5412 Radiculopathy, cervical region: Secondary | ICD-10-CM | POA: Insufficient documentation

## 2015-03-19 MED ORDER — HYDROCODONE-ACETAMINOPHEN 5-325 MG PO TABS: 1.0000 | ORAL_TABLET | Freq: Four times a day (QID) | ORAL | Status: DC | PRN

## 2015-03-19 MED ORDER — ETODOLAC ER 400 MG PO TB24: 400.0000 mg | ORAL_TABLET | Freq: Every day | ORAL | Status: DC

## 2015-03-19 NOTE — Patient Instructions (Signed)
Continue the medications that you are taking.  Physical therapy will be set up. If this is not arranged in the next few days give [place] a call  If you are worsening or if you are not better after a week or 2 of therapy, give [place] a call and we will set up an epidural steroid injection

## 2015-03-19 NOTE — Progress Notes (Signed)
[facility]  PATIENT: Kimberly Klein DOB: 10-11-1950  REFERRING DOCTOR OR PCP:  [facility] SOURCE: patient, records in EMR, MRI report and MRI images on PACS.    _________________________________   HISTORICAL  CHIEF COMPLAINT:  Chief Complaint  Patient presents with  . Right Arm Pain    Sts. has had right shoulder/arm pain onset 2 weeks ago after bending over to tie tennis shoes.  X-rays of c-spine and MRI of c-spine done and in EPIC./fim    HISTORY OF PRESENT ILLNESS:  I had the pleasure of seeing your patient, Kimberly Klein, at [facility] for neurologic consultation regarding her right cervical radiculopathy.  She is a 65 year old woman who had the onset of symptoms 2 weeks ago after she bent over to tie her shoes. When she got back up she had pain that radiated from her neck into the ulnar side of her right arm and hand. Additionally she had a pain in the shoulder blade region of the right. She continues to experience discomfort. Initially she was placed on a steroid pack and prescribed cyclobenzaprine and Norco. With those medications she had some relief but pain persisted. A second steroid pack was more recently prescribed. In general, pain increases when she is sitting for long period of time. Pian is reduced when she lays on her back with her right arm behind her head. Besides pain, she is experiencing numbness that goes on the underside of the right arm to the fourth and fifth digits of her hand. She is also noticing weakness and decreased coordination of the right hand.  She is not experiencing any change in her bladder or bowel function. She has not experienced any change with strength or weakness in the legs.  Note from Dr. Wynetta Emery and MRI and x-ray reports were reviewed. I personally reviewed the MRI of the cervical spine performed recently. 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 There are small  disc bulges at those levels. She has moderate spinal stenosis at C5-C6 and mild to moderate spinal stenosis at C6-C7. She has mild to moderate bilateral foraminal narrowing at those levels. Additionally, at C7-T1 she has a right disc extrusion. It does not appear to cause C8 nerve root compression.  REVIEW OF SYSTEMS: Constitutional: No fevers, chills, sweats, or change in appetite Eyes: No visual changes, double vision, eye pain Ear, nose and throat: No hearing loss, ear pain, nasal congestion, sore throat Cardiovascular: No chest pain, palpitations Respiratory: No shortness of breath at rest or with exertion.   No wheezes GastrointestinaI: No nausea, vomiting, diarrhea, abdominal pain, fecal incontinence Genitourinary: No dysuria, urinary retention or frequency.  No nocturia. Musculoskeletal: as above Integumentary: No rash, pruritus, skin lesions Neurological: as above Psychiatric: No depression at this time.  No anxiety Endocrine: No palpitations, diaphoresis, change in appetite, change in weigh or increased thirst Hematologic/Lymphatic: No anemia, purpura, petechiae. Allergic/Immunologic: No itchy/runny eyes, nasal congestion, recent allergic reactions, rashes  ALLERGIES: No Known Allergies  HOME MEDICATIONS:  Current outpatient prescriptions:  .  cyclobenzaprine (FLEXERIL) 10 MG tablet, Take 1 tablet (10 mg total) by mouth 3 (three) times daily as needed for muscle spasms., Disp: 90 tablet, Rfl: 0 .  HYDROcodone-acetaminophen (NORCO/VICODIN) 5-325 MG tablet, Take 1 tablet by mouth every 6 (six) hours as needed for moderate pain., Disp: 30 tablet, Rfl: 0 .  predniSONE (DELTASONE) 10 MG tablet, 6 tabs today and tomorrow, 5 tabs days 3 and 4, decrease by  1 every other day until gone., Disp: 42 tablet, Rfl: 0  PAST MEDICAL HISTORY: Past Medical History  Diagnosis Date  . SVT (supraventricular tachycardia) ([place]) 07/2013    strees test- normal    PAST SURGICAL HISTORY: Past  Surgical History  Procedure Laterality Date  . Appendectomy      FAMILY HISTORY: Family History  Problem Relation Age of Onset  . Hypertension Father   . Hyperlipidemia Father   . Heart attack Father   . Stroke Father   . Bladder Cancer Brother   . Liver cancer Sister   . Multiple myeloma Brother     SOCIAL HISTORY:  Social History   Social History  . Marital Status: Widowed    Spouse Name: N/A  . Number of Children: N/A  . Years of Education: N/A   Occupational History  . Not on file.   Social History Main Topics  . Smoking status: Former Smoker -- 20 years    Types: Cigarettes    Quit date: 10/03/2007  . Smokeless tobacco: Never Used  . Alcohol Use: 2.4 - 3.0 oz/week    4-5 Glasses of wine per week     Comment: occasional  . Drug Use: No  . Sexual Activity: Yes    Birth Control/ Protection: Post-menopausal   Other Topics Concern  . Not on file   Social History Narrative     PHYSICAL EXAM  Filed Vitals:   03/19/15 0926  BP: 140/96  Pulse: 76  Height: 5' 1.4" (1.56 m)  Weight: 160 lb (72.576 kg)    Body mass index is 29.82 kg/(m^2).   General: The patient is well-developed and well-nourished and in no acute distress  Neck: The neck is supple, no carotid bruits are noted.  The neck is nontender.   Range of motion is normal.  Cardiovascular: The heart has a regular rate and rhythm with a normal S1 and S2. There were no murmurs, gallops or rubs.   Skin: Extremities are without significant edema.  Musculoskeletal:  Back is nontender  Neurologic Exam  Mental status: The patient is alert and oriented x 3 at the time of the examination. The patient has apparent normal recent and remote memory, with an apparently normal attention span and concentration ability.   Speech is normal.  Cranial nerves: Extraocular movements are full. There is good facial sensation to soft touch bilaterally.Facial strength is normal.  Trapezius and sternocleidomastoid  strength is normal. No dysarthria is noted.  The tongue is midline, and the patient has symmetric elevation of the soft palate. No obvious hearing deficits are noted.  Motor:  Muscle bulk is normal.   Tone is normal.    Strength is 4+/5 right triceps,  4+/5 finger extensors.  and 4/5 in the intrinsic hand muscles.   Elsewhere, strength is  5 / 5.   Sensory: She has reduced touch sensation involving the fourth and fifth digits of the right hand and adjacent dorsum. She has reduced vibration sensation in the fifth digit on the right. Sensation was normal in the left arm and both legs..  Coordination: Cerebellar testing reveals good finger-nose-finger and heel-to-shin bilaterally.  Gait and station: Station is normal.   Gait is normal. Tandem gait is normal. Romberg is negative.   Reflexes: Deep tendon reflexes are symmetric and normal bilaterally.   Plantar responses are flexor.    DIAGNOSTIC DATA (LABS, IMAGING, TESTING) - I reviewed patient records, labs, notes, testing and imaging myself where available.  Lab Results  Component Value Date   WBC 7.0 07/18/2013   HGB 13.6 07/18/2013   HCT 41.3 07/18/2013   MCV 94 07/18/2013   PLT 196 07/18/2013      Component Value Date/Time   NA 135* 07/18/2013 1335   K 4.1 07/18/2013 1335   CL 101 07/18/2013 1335   CO2 28 07/18/2013 1335   GLUCOSE 105* 07/18/2013 1335   BUN 7 07/18/2013 1335   CREATININE 0.94 07/18/2013 1335   CALCIUM 9.3 07/18/2013 1335   PROT 7.5 07/18/2013 1335   ALBUMIN 3.7 07/18/2013 1335   AST 35 07/18/2013 1335   ALT 34 07/18/2013 1335   ALKPHOS 79 07/18/2013 1335   BILITOT 0.7 07/18/2013 1335   GFRNONAA >60 07/18/2013 1335   GFRAA >60 07/18/2013 1335       ASSESSMENT AND PLAN  Cervical radiculopathy at C8 - Plan: Ambulatory referral to Physical Therapy  Herniated cervical disc without myelopathy - Plan: Ambulatory referral to Physical Therapy  Spinal stenosis in cervical region - Plan: Ambulatory  referral to Physical Therapy   In summary, Kimberly Klein is a 65 year old woman with a right C8 radiculopathy. Based on the MRI, this is most likely from the herniated disc at C7-T1 currently, she has pain, mildly reduced sensation in the C8 dermatome and myotome.   She feels that she has received a mild benefit from medications to date. I will have her complete the steroid Dosepak and start an NSAID.  Additionally, I am going to send her for physical therapy which will hopefully help her pain and give her more chance to recover. If she worsens over the next couple weeks or if she does not improve, I will send her to have an epidural steroid injection at C7-T1. If strength becomes worse or if she does not get a benefit from the epidural, we will consider sending her to neurosurgery.  She will return to see me in 6 weeks but call sooner if she has new or worsening neurologic symptoms.  Thank you very much for asking me to see Mrs. Boutin for a neurologic consultation. Please let me know if I can be of further assistance with her or other patients in the future.   Richard A. [name] Shelling, MD, PhD 0/0/9381, 8:29 AM Certified in Neurology, Clinical Neurophysiology, Sleep Medicine, Pain Medicine and Neuroimaging  [facility] [address], [place], [place] [address] [phone]

## 2015-03-23 ENCOUNTER — Ambulatory Visit: Payer: Self-pay | Admitting: Family Medicine

## 2015-03-28 ENCOUNTER — Ambulatory Visit: Payer: PPO | Attending: Neurology | Admitting: Physical Therapy

## 2015-03-28 ENCOUNTER — Encounter: Payer: Self-pay | Admitting: Physical Therapy

## 2015-03-28 DIAGNOSIS — R52 Pain, unspecified: Secondary | ICD-10-CM

## 2015-03-28 DIAGNOSIS — M6281 Muscle weakness (generalized): Secondary | ICD-10-CM | POA: Insufficient documentation

## 2015-03-28 DIAGNOSIS — R29898 Other symptoms and signs involving the musculoskeletal system: Secondary | ICD-10-CM

## 2015-03-28 DIAGNOSIS — M25521 Pain in right elbow: Secondary | ICD-10-CM | POA: Diagnosis not present

## 2015-03-28 NOTE — Therapy (Signed)
[facility] [address], [place], [address] Phone: [phone]   Fax:  [phone]  Physical Therapy Evaluation  Patient Details  Name: Kimberly Klein MRN: [id number] Date of Birth: 27-Oct-1950 Referring Provider: Britt Bottom MD  Encounter Date: 03/28/2015      PT End of Session - 03/28/15 1831    Visit Number 1   Number of Visits 10   Date for PT Re-Evaluation 05/09/15   Authorization Type 1   Authorization Time Period 10 (G code)   PT Start Time [id number]   PT Stop Time 1140   PT Time Calculation (min) 57 min   Activity Tolerance Patient tolerated treatment well;No increased pain   Behavior During Therapy [facility] for tasks assessed/performed      Past Medical History  Diagnosis Date  . SVT (supraventricular tachycardia) ([place]) 07/2013    strees test- normal    Past Surgical History  Procedure Laterality Date  . Appendectomy      There were no vitals filed for this visit.  Visit Diagnosis:  Elbow pain, right - Plan: PT plan of care cert/re-cert  Radiating pain - Plan: PT plan of care cert/re-cert  Decreased grip strength of right hand - Plan: PT plan of care cert/re-cert      Subjective Assessment - 03/28/15 1801    Subjective Patient reports increased R elbow pain, decreased ability to perform fine motor skills with R arm, and  N/T into the medial forearm and the 4th and 5th digit. Patient reports difficulty with sleeping, knitting, opening jars, performing recreational activties (such as pickle ball and fly fishing), performing occupational duties (such as typing and raising her hand to eat), lifting her 4th/5th finger on the right, washing, and preparing food. States she as increased pain when sitting for long periods of time and typing at her computer for work. Patient mentions pain worse in the morning versus throuhhout the day.  Lying supine and heat assist with decreasing pain. Current pain  is 3/10 (which is the lowest ) and at worse (8/10).   Pertinent History Onset of symptoms with bending over to pick up an object from the ground on 03/13/15. States pain is improving since the incident but N/T symptoms have remained the same.    Limitations Lifting;Sitting;Writing;House hold activities   Diagnostic tests X-Ray: Disc herniation at C7-8   Patient Stated Goals Be able to use R UE without difficulty and return to painfree knitting and typing activities.    Currently in Pain? Yes   Pain Score 3    Pain Location Elbow   Pain Descriptors / Indicators Aching   Pain Type Acute pain   Pain Radiating Towards medial forearm into 4-5th digits.   Pain Onset 1 to 4 weeks ago   Pain Frequency Constant   Aggravating Factors  Sitting for increased periods of time and typing.    Pain Relieving Factors lying supine   Effect of Pain on Daily Activities Unable to perform recreational activities with fine motor skills, occupational duties, and ADLs such as food preparation and eating.            [facility] PT Assessment - 03/28/15 1054    Assessment   Medical Diagnosis M54.12 (ICD-10-CM) - Cervical radiculopathy at C8, M50.20 (ICD-10-CM) - Herniated cervical disc without myelopathy; M48.02 (ICD-10-CM) - Spinal stenosis in cervical region   Referring Provider Britt Bottom MD   Onset Date/Surgical Date 03/13/15   Hand Dominance  Right   Next MD Visit 04/09/15   Prior Therapy no   Precautions   Precautions None   Restrictions   Weight Bearing Restrictions No   Balance Screen   Has the patient fallen in the past 6 months No   Has the patient had a decrease in activity level because of a fear of falling?  No   Is the patient reluctant to leave their home because of a fear of falling?  No   Home Environment   Living Environment Private residence   Living Arrangements Alone   Type of [place] Access Stairs to enter  2   Entrance Stairs-Number of Steps 2   [place] Two level    Alternate Level Stairs-Rails Can reach both   Prior Function   Level of Independence Independent   Vocation Part time employment   Administrator, writing   Leisure Pickle ball, walking, taking care of puppy, fly fishing   Cognition   Overall Cognitive Status Within Functional Limits for tasks assessed     Objective: Observation: Increased forward head posture and rounded shoulders Palpation: Increased spasms along scapular retractors and upper trapezius right side  Measurement: AROM/MMT: Neck: Flexion 55, 5/5; Extension: 55, 5/5 -- minor pulling feeling along scapular retractors; Right lateral flexion: 40, 5/5; Left lateral flexion: 35, 5/5 -- minor pulling sensation along scapular retractors; Right rotation: 55, Left rotation: 55 Shoulder: AROM/MMT: WNL bilaterally for all motions Hand: unable to extend digits 4-5 against gravity  Neuro Exam: Decreased sensation along C7 nerve root on R; Decreased strength along triceps(4/5), finger adductors (3+/5), finger abductors (3+/5), and thumb opposition (3+/5)  Special Tests: Repeated motions: Decreased elbow pain with: cervical retraction performed 2 x 10; scapular retractions performed x 10 Hand held Dynamometry: R: 35, 27; L: 45, 47  (-): Spurling's, cervical distraction, cervical compression  Outcome measures: NDI 29%, QuickDash 59%, work and sports modules 75%  Therapeutic Exercise: Patient performed exercises with guidance, verbal and tactile cues and demonstration of therapist: Supine cervical retraction isometrics -- x 10 Scapular retraction in sitting -- x 10 Ulnar nerve glides -- x 10 Thoracic extension in sitting -- x 3 Cervical retraction in sitting -- x 10  Patient response to treatment:  Decreased pain after performing cervical retraction and scapular retraction in sitting indicative of disc involvement. Weakness in hand and decrease in sensation indicates involvement in C7-T1 nerve roots. Patient performed  exercises with good technique following demonstration and with verbal cuing         PT Education - 03/28/15 1829    Education provided Yes   Education Details HEP: scapular retractions, cervical retractions (In sitting/supine against a pillow), thoracic extension in sitting. Educated on sleeping and sitting position to decrease pain (use of cervical roll and lumbar supports) and use of cervical retraction device.   Person(s) Educated Patient   Methods Explanation;Demonstration;Tactile cues;Verbal cues   Comprehension Verbalized understanding;Returned demonstration;Verbal cues required             PT Long Term Goals - 03/28/15 1842    PT LONG TERM GOAL #1   Title Pt will report a score of 15%  on the neck disability index by 05/09/15 to demonstrate significant improvement in neck function and ability to sit for long periods of time without pain.   Baseline NDI score 29% (Moderate impairment)   Status New   PT LONG TERM GOAL #2   Title Pt will report a score of 30%  on the QuickDASH by 05/09/15 to demonstrate significant improvement in UE function and ability to engage in occupation duties such as typing without increase of pain.    Baseline QuickDASH score 59% (Severe impairment)   Status New   PT LONG TERM GOAL #3   Title Patient will be independent with progression and performance of HEP focusing on increasing muscular endurance, strength, and coordination of the neck, scapula and UE  by 05/09/15 to allow for further improvement of condition after discharge of therapy.    Baseline limited knowledge of appropriate pain control strategies and exercise progression for HEP currently requiring frequent cueing.   Status New               Plan - 2015/04/01 1915    Clinical Impression Statement Pt is a 65 yo right hand dominant female experiencing increased elbow pain, decreased ability to use right hand, and decreased sensation in the R UE since 03/13/2015. NDI and QuickDASH outcome  measures indicate moderate neck and severe UE dysfunction. Decrease in pain after performing repeated scapular/cervical retractions indicate cervical disc involvement of C7-T1. Patient will benefit from further skilled therapy aimed at increasing muscular endurance, coordination, and strength to return to prior level of function.    Pt will benefit from skilled therapeutic intervention in order to improve on the following deficits Decreased coordination;Decreased endurance;Decreased range of motion;Decreased mobility;Decreased strength;Impaired UE functional use;Pain;Impaired sensation;Postural dysfunction;Increased muscle spasms   Rehab Potential Good   PT Frequency 2x / week   PT Duration 6 weeks   PT Treatment/Interventions ADLs/Self Care Home Management;Biofeedback;Cryotherapy;Electrical Stimulation;Iontophoresis 4mg /ml Dexamethasone;Moist Heat;Ultrasound;Patient/family education;Neuromuscular re-education;Therapeutic exercise;Therapeutic activities;Passive range of motion;Manual techniques   PT Next Visit Plan Cervical, scapular retraction, hand griping and reverse chin up   PT Home Exercise Plan Cervical retraction isometrics, scapular retraction isometrics, thoracic ext, and cervical retraction in sitting   Consulted and Agree with Plan of Care Patient          G-Codes - 04-01-2015 1200    Functional Assessment Tool Used NDI, QuickDash, strength deficits, clinical judgment, pain   Functional Limitation Carrying, moving and handling objects   Carrying, Moving and Handling Objects Current Status [id number]) At least 40 percent but less than 60 percent impaired, limited or restricted   Carrying, Moving and Handling Objects Goal Status [id number]) At least 1 percent but less than 20 percent impaired, limited or restricted       Problem List Patient Active Problem List   Diagnosis Date Noted  . Cervical radiculopathy at C8 03/19/2015  . Herniated cervical disc without myelopathy 03/19/2015  .  Spinal stenosis in cervical region 03/19/2015  . GERD (gastroesophageal reflux disease) 07/25/2013  . Paroxysmal supraventricular tachycardia ([place]) 07/25/2013  . Pain in the chest 07/19/2013    Blythe Stanford, SPT 03/29/2015, 4:59 PM  [facility] [address], [place], [address] Phone: [phone]   Fax:  [phone]  Name: Kimberly Klein MRN: [id number] Date of Birth: Dec 23, 1950

## 2015-03-30 ENCOUNTER — Ambulatory Visit: Payer: PPO | Admitting: Physical Therapy

## 2015-03-30 ENCOUNTER — Encounter: Payer: Self-pay | Admitting: Physical Therapy

## 2015-03-30 DIAGNOSIS — M25521 Pain in right elbow: Secondary | ICD-10-CM | POA: Diagnosis not present

## 2015-03-30 DIAGNOSIS — R52 Pain, unspecified: Secondary | ICD-10-CM

## 2015-03-30 DIAGNOSIS — R29898 Other symptoms and signs involving the musculoskeletal system: Secondary | ICD-10-CM

## 2015-03-30 NOTE — Therapy (Signed)
[facility] [address], [place], [address] Phone: [phone]   Fax:  [phone]  Physical Therapy Treatment  Patient Details  Name: Kimberly Klein MRN: [id number] Date of Birth: 1950-08-31 Referring Provider: Britt Bottom MD  Encounter Date: 03/30/2015      PT End of Session - 03/30/15 0903    Visit Number 2   Number of Visits 10   Date for PT Re-Evaluation 05/09/15   Authorization Type 2   Authorization Time Period 10 (G code)   PT Start Time 0758   PT Stop Time [id number]   PT Time Calculation (min) 39 min   Activity Tolerance Patient tolerated treatment well;No increased pain   Behavior During Therapy [facility] for tasks assessed/performed      Past Medical History  Diagnosis Date  . SVT (supraventricular tachycardia) ([place]) 07/2013    strees test- normal    Past Surgical History  Procedure Laterality Date  . Appendectomy      There were no vitals filed for this visit.  Visit Diagnosis:  Decreased grip strength of right hand  Radiating pain  Elbow pain, right      Subjective Assessment - 03/30/15 0758    Subjective Patient states pain is simliar to previous session. States her right 5th digit now has a jammed feeling. She is still having difficulty with extending 4th and 5th digits right hand.    Limitations Lifting;Sitting;Writing;House hold activities   Patient Stated Goals Be able to use R UE without difficulty and return to painfree knitting and typing activities.    Currently in Pain? Yes   Pain Score 5    Pain Location Elbow   Pain Orientation Right   Pain Descriptors / Indicators Aching   Pain Type Acute pain      Objective: Observation: Increased forward head posture, bilateral shoulder hiking and rounded shoulders AROM: right hand decreased ability to extend 4th and 5th digits to neutral  Palpation: Increased spasms along scapular retractors and upper trapezius on right  side, [id number] tenderness elicited to right side   Treatment:   Therapeutic Exercise: Patient performed exercises with guidance, verbal and tactile cues and demonstration of therapist: Supine cervical retraction with holding end range position  -- x 10 ~15 sec hold Serratus punches  -- x10 Scapular retraction in supine-- x 10 Thoracic extension in sitting -- x 10 Cervical retraction in sitting -- x 10 Scapular depression in sitting -- 5 x 15 seconds  Manual therapy: STM using deep and superficial techniques to the right scapular retractors and upper traps with the patient in the seated position,    Modalities: [place]: patient in sitting with arm supported on pillow. Parameters: 3.3MHz, 1.4 W/cm2, 50% duty cycle x 8 min. Performed to right side paraspinal muscles/upper trapezius region lower cervical spine/ upper T spine: goals: pain, decrease inflammation:  No adverse response noted before or after [place].  Patient response to treatment:  Decreased pain following postural exercises and [place] indicating decreased inflammation and improved head on neck posture. Good demonstration of exercise requiring minimal cueing when performing scapular retraction in sitting and supine, improved ability to extend right hand digits 4 and 5 with right UE supported on pillow and decreased tenderness in cervical spine following [place]        PT Long Term Goals - 03/28/15 1842    PT LONG TERM GOAL #1   Title Pt will report a score of 15%  on the  neck disability index by 05/09/15 to demonstrate significant improvement in neck function and ability to sit for long periods of time without pain.   Baseline NDI score 29% (Moderate impairment)   Status New   PT LONG TERM GOAL #2   Title Pt will report a score of 30% on the QuickDASH by 05/09/15 to demonstrate significant improvement in UE function and ability to engage in occupation duties such as typing without increase of pain.    Baseline QuickDASH score 59% (Severe  impairment)   Status New   PT LONG TERM GOAL #3   Title Patient will be independent with progression and performance of HEP focusing on increasing muscular endurance, strength, and coordination of the neck, scapula and UE  by 05/09/15 to allow for further improvement of condition after discharge of therapy.    Baseline limited knowledge of appropriate pain control strategies and exercise progression for HEP currently requiring frequent cueing.   Status New               Plan - 03/30/15 0915    Clinical Impression Statement Patient is making progress towards long term goals with improvement in performance of scapular/cervical retraction exercise. Improved finger extension after performing postural based exercise and supporting right UE on pillow with [place] treatment indicating decreased inflammation. Patient will benefit from further skilled therapy to return to prior level of function with improved functional use right UE/hand.    Pt will benefit from skilled therapeutic intervention in order to improve on the following deficits Decreased coordination;Decreased endurance;Decreased range of motion;Decreased mobility;Decreased strength;Impaired UE functional use;Pain;Impaired sensation;Postural dysfunction;Increased muscle spasms   Rehab Potential Good   PT Frequency 2x / week   PT Duration 6 weeks   PT Treatment/Interventions ADLs/Self Care Home Management;Biofeedback;Cryotherapy;Electrical Stimulation;Iontophoresis 4mg /ml Dexamethasone;Moist Heat;Ultrasound;Patient/family education;Neuromuscular re-education;Therapeutic exercise;Therapeutic activities;Passive range of motion;Manual techniques   PT Next Visit Plan Cervical, scapular retraction, hand griping and reverse chin up   PT Home Exercise Plan Cervical retraction isometrics, scapular retraction isometrics, thoracic ext, and cervical retraction in sitting   Consulted and Agree with Plan of Care Patient        Problem List Patient  Active Problem List   Diagnosis Date Noted  . Cervical radiculopathy at C8 03/19/2015  . Herniated cervical disc without myelopathy 03/19/2015  . Spinal stenosis in cervical region 03/19/2015  . GERD (gastroesophageal reflux disease) 07/25/2013  . Paroxysmal supraventricular tachycardia ([place]) 07/25/2013  . Pain in the chest 07/19/2013    Blythe Stanford, SPT 03/30/2015, 5:08 PM  [facility] [address], [place], [address] Phone: [phone]   Fax:  [phone]  Name: Kimberly Klein MRN: [id number] Date of Birth: 1950/10/30

## 2015-04-04 ENCOUNTER — Encounter: Payer: Self-pay | Admitting: Physical Therapy

## 2015-04-04 ENCOUNTER — Ambulatory Visit: Payer: PPO | Admitting: Physical Therapy

## 2015-04-04 DIAGNOSIS — M25521 Pain in right elbow: Secondary | ICD-10-CM | POA: Diagnosis not present

## 2015-04-04 DIAGNOSIS — R29898 Other symptoms and signs involving the musculoskeletal system: Secondary | ICD-10-CM

## 2015-04-04 DIAGNOSIS — R52 Pain, unspecified: Secondary | ICD-10-CM

## 2015-04-04 NOTE — Therapy (Signed)
[facility] [address], [place], [address] Phone: [phone]   Fax:  [phone]  Physical Therapy Treatment  Patient Details  Name: Kimberly Klein MRN: [id number] Date of Birth: 05/05/1950 Referring Provider: Britt Bottom MD  Encounter Date: 04/04/2015      PT End of Session - 04/04/15 1119    Visit Number 3   Number of Visits 10   Date for PT Re-Evaluation 05/09/15   Authorization Type 3   Authorization Time Period 10 (G code)   PT Start Time 0916   PT Stop Time 0959   PT Time Calculation (min) 43 min   Activity Tolerance Patient tolerated treatment well;No increased pain   Behavior During Therapy [facility] for tasks assessed/performed      Past Medical History  Diagnosis Date  . SVT (supraventricular tachycardia) ([place]) 07/2013    strees test- normal    Past Surgical History  Procedure Laterality Date  . Appendectomy      There were no vitals filed for this visit.  Visit Diagnosis:  Decreased grip strength of right hand  Radiating pain  Elbow pain, right      Subjective Assessment - 04/04/15 0920    Subjective Patient states elbow pain is improving and the fingers cant extend with more ease. Patient states driving continues to be difficult.   Limitations Lifting;Sitting;Writing;House hold activities   Patient Stated Goals Be able to use R UE without difficulty and return to painfree knitting and typing activities.    Currently in Pain? No/denies      Objective: Observation: Increased forward head posture, minor bilateral shoulder hiking and rounded shoulders -- improved today versus previous visit  AROM: right hand decreased ability to extend 4th and 5th digits to neutral  Palpation: Increased spasms along scapular retractors, upper trap, and infraspinatus  Treatment:  Therapeutic Exercise: Patient performed exercises with guidance, verbal and tactile cues and demonstration of  therapist: DNF (deep neck flexion) in sitting applying pressure onto incisors -- x15 Reverse pull up sitting at [place] -- x 10 #15 Scapular Retraction sitting at [place] -- x 10 #10 Wall angels with back against wall -- x 12 External rotation standing with back against wall with red band -- x 15 Thoracic extension in sitting over the chair -- x 10 Cervical retraction in sitting with scapular retraction-- 2x 10 -- performed with arm supported on pillow  Manual therapy: STM using deep and superficial techniques to the right scapular retractors, infraspinatus, upper traps teres minor with the patient in the seated position.  Patient response to treatment:  Increased 4th and 5th digits movement after performing cervical/scapular retraction and scapular retraction in sitting with arm supported on pillow. Pt with improved sensation along c7 nerve root after performing cervical/scapular retraction exercise.        PT Education - 04/04/15 1112    Education provided Yes   Education Details HEP: DNF, external rotation with band, cervical retraction/scapular retraction   Person(s) Educated Patient   Methods Explanation;Demonstration   Comprehension Verbalized understanding;Returned demonstration             PT Long Term Goals - 03/28/15 1842    PT LONG TERM GOAL #1   Title Pt will report a score of 15%  on the neck disability index by 05/09/15 to demonstrate significant improvement in neck function and ability to sit for long periods of time without pain.   Baseline NDI score 29% (Moderate impairment)  Status New   PT LONG TERM GOAL #2   Title Pt will report a score of 30% on the QuickDASH by 05/09/15 to demonstrate significant improvement in UE function and ability to engage in occupation duties such as typing without increase of pain.    Baseline QuickDASH score 59% (Severe impairment)   Status New   PT LONG TERM GOAL #3   Title Patient will be independent with progression and  performance of HEP focusing on increasing muscular endurance, strength, and coordination of the neck, scapula and UE  by 05/09/15 to allow for further improvement of condition after discharge of therapy.    Baseline limited knowledge of appropriate pain control strategies and exercise progression for HEP currently requiring frequent cueing.   Status New               Plan - 04/04/15 1128    Clinical Impression Statement Patient making progress towards long term goals with improvement in scapular/cervical motor control and 4th/5th digit extension in sitting. Discussed returning to therapy 1xweek with increased emphasis on maintenance of symptoms with HEP. Pt continues with  limitations in scapular/cervical muscle coordination and endurance and will benefit from futher skilled therapy to return to prior level of function.    Pt will benefit from skilled therapeutic intervention in order to improve on the following deficits Decreased coordination;Decreased endurance;Decreased range of motion;Decreased mobility;Decreased strength;Impaired UE functional use;Pain;Impaired sensation;Postural dysfunction;Increased muscle spasms   Rehab Potential Good   PT Frequency 2x / week   PT Duration 6 weeks   PT Treatment/Interventions ADLs/Self Care Home Management;Biofeedback;Cryotherapy;Electrical Stimulation;Iontophoresis 4mg /ml Dexamethasone;Moist Heat;Ultrasound;Patient/family education;Neuromuscular re-education;Therapeutic exercise;Therapeutic activities;Passive range of motion;Manual techniques   PT Next Visit Plan Cervical, scapular retraction, hand griping and reverse chin up   PT Home Exercise Plan Cervical retraction isometrics, scapular retraction isometrics, thoracic ext, and cervical retraction in sitting   Consulted and Agree with Plan of Care Patient        Problem List Patient Active Problem List   Diagnosis Date Noted  . Cervical radiculopathy at C8 03/19/2015  . Herniated cervical  disc without myelopathy 03/19/2015  . Spinal stenosis in cervical region 03/19/2015  . GERD (gastroesophageal reflux disease) 07/25/2013  . Paroxysmal supraventricular tachycardia ([place]) 07/25/2013  . Pain in the chest 07/19/2013    Blythe Stanford, SPT 04/04/2015, 11:32 AM  [facility] [address] [place], [address] Phone: [phone]   Fax:  [phone]  Name: Kimberly Klein MRN: [id number] Date of Birth: 1950-05-16

## 2015-04-06 ENCOUNTER — Ambulatory Visit: Payer: PPO | Admitting: Physical Therapy

## 2015-04-09 ENCOUNTER — Ambulatory Visit: Payer: Self-pay | Admitting: Neurology

## 2015-04-11 ENCOUNTER — Encounter: Payer: Self-pay | Admitting: Physical Therapy

## 2015-04-11 ENCOUNTER — Ambulatory Visit: Payer: PPO | Admitting: Physical Therapy

## 2015-04-11 DIAGNOSIS — M25521 Pain in right elbow: Secondary | ICD-10-CM

## 2015-04-11 DIAGNOSIS — R29898 Other symptoms and signs involving the musculoskeletal system: Secondary | ICD-10-CM

## 2015-04-11 DIAGNOSIS — R52 Pain, unspecified: Secondary | ICD-10-CM

## 2015-04-11 NOTE — Therapy (Addendum)
[facility] [address] [place], [address] Phone: [phone]   Fax:  [phone]  Physical Therapy Treatment/ Discharge Summary     Patient Details  Name: Kimberly Klein MRN: [id number] Date of Birth: 04/10/1950 Referring Provider: Britt Bottom MD  Encounter Date: 04/11/2015   Patient began physical therapy 03/28/15 and attended 4 sessions throughout 04/11/15 with goals achieved and patient independent with home program for self management of symptoms and exercises. Plan discharge from physical therapy.       PT End of Session - 04/11/15 1023    Visit Number 4   Number of Visits 10   Date for PT Re-Evaluation 05/09/15   Authorization Type 4   Authorization Time Period 10 (G code)   PT Start Time 0919   PT Stop Time 1005   PT Time Calculation (min) 46 min   Activity Tolerance Patient tolerated treatment well;No increased pain   Behavior During Therapy [facility] for tasks assessed/performed      Past Medical History  Diagnosis Date  . SVT (supraventricular tachycardia) ([place]) 07/2013    strees test- normal    Past Surgical History  Procedure Laterality Date  . Appendectomy      There were no vitals filed for this visit.  Visit Diagnosis:  Radiating pain  Decreased grip strength of right hand  Elbow pain, right      Subjective Assessment - 04/11/15 0921    Subjective Patient states shes able to sleep on the affected side, drive, and play pickle ball. States she was able to clean her household without onset of pain. She agrees to discharge from physical therapy at this time.   Limitations Lifting;Sitting;Writing;House hold activities   Patient Stated Goals Be able to use R UE without difficulty and return to painfree knitting and typing activities.    Currently in Pain? No/denies        Measurement: AROM/MMT: Neck: Flexion 60, 5/5; Extension: 60, 5/5; Right lateral flexion: 40, 5/5; Left  lateral flexion: 40, 5/5; Right rotaion: 65, Left rotation: 65 Shoulder: AROM/MMT: WNL bilaterally for all motions Hand: unable to extend digits 4-5 against gravity  Neuro Exam: Decreased sensation along C7 nerve root on R: Decreased strength along triceps(4/5), finger adductors (4/5), finger abductors (3+/5), and thumb opposition (4/5)  Special Tests: Repeated motions: Decreased elbow pain with: cervical retraction performed 2 x 10; scapular retractions performed x 10 Hand held Dynamometry: R: 40; L:  50    Outcome measures: NDI 10%, QuickDash 11%, work: 0% and sports modules: 25%  Therapeutic Exercise: Patient performed exercises with guidance, verbal and tactile cues and demonstration of therapist: Dark Green Putty(firm intensity)  -- finger extension, flexion, finger abduction/adduction, opposition, gripping all performed following demonstration DNF (deep neck flexion) in sitting applying pressure onto incisors -- x15 Shoulder flexion on the ball bilateral -- yellow physioball x 15 Wall push up PLUS -- x 15 Wall angels with back against wall -- x 12 Scapular Retraction sitting at [place] -- x 20 #10 Reverse pull up sitting at [place] -- x 10 #15 Straight Arm pull downs at [facility] -- x15 #10 Thoracic extension in sitting over the chair with a towel-- x 10   Patient response to treatment:  Good demonstration of appropriate muscle activation during scapular retraction at [facility] requiring minimal tactile cueing to perform. Good demonstration of new exercises in addition to other HEP exercises  PT Education - 2015/05/10 1022    Education provided Yes   Education Details HEP: shoulder flexion on ball against wall, wall angels, push up plus   Person(s) Educated Patient   Methods Explanation;Demonstration;Handout   Comprehension Verbalized understanding;Returned demonstration             PT Long Term Goals - May 10, 2015 1016    PT LONG TERM GOAL #1   Title Pt will  report a score of 15%  on the neck disability index by 05/09/15 to demonstrate significant improvement in neck function and ability to sit for long periods of time without pain.   Baseline NDI score 29% (Moderate impairment) (05/10/2015 NDI: 10% minimal impairment)   Status Achieved   PT LONG TERM GOAL #2   Title Pt will report a score of 30% on the QuickDASH by 05/09/15 to demonstrate significant improvement in UE function and ability to engage in occupation duties such as typing without increase of pain.    Baseline QuickDASH score 59% (Severe impairment) (2015/05/10 QuickDASH: 11% minimal impairment)   Status Achieved   PT LONG TERM GOAL #3   Title Patient will be independent with progression and performance of HEP focusing on increasing muscular endurance, strength, and coordination of the neck, scapula and UE  by 05/09/15 to allow for further improvement of condition after discharge of therapy.    Baseline limited knowledge of appropriate pain control strategies and exercise progression for HEP currently requiring frequent cueing. (05-10-15: Independent with exercise performance, progression, and pain control)   Status Achieved               Plan - 2015-05-10 1043    Clinical Impression Statement Patient has met all long term goals and demonstrates significant improvement with UE and neck function indicated by improved NDI and QuickDASH scores. Improvement in finger ext strength and mobility today but continues to experience N/T into right UE to ring and little finger (C8) . Patient is independent with HEP to address limitations of muscular endurance and coordination and is ready to be discharged from physical therapy.    Pt will benefit from skilled therapeutic intervention in order to improve on the following deficits Decreased coordination;Decreased endurance;Decreased range of motion;Decreased mobility;Decreased strength;Impaired UE functional use;Pain;Impaired sensation;Postural  dysfunction;Increased muscle spasms   Rehab Potential Good   PT Frequency 2x / week   PT Duration 6 weeks   PT Treatment/Interventions ADLs/Self Care Home Management;Biofeedback;Cryotherapy;Electrical Stimulation;Iontophoresis 64m/ml Dexamethasone;Moist Heat;Ultrasound;Patient/family education;Neuromuscular re-education;Therapeutic exercise;Therapeutic activities;Passive range of motion;Manual techniques   PT Next Visit Plan Discharge from PT   PT Home Exercise Plan Cervical retraction isometrics, scapular retraction isometrics, thoracic ext, and cervical retraction in sitting   Consulted and Agree with Plan of Care Patient          G-Codes - 0April 27, [id number]    Functional Assessment Tool Used NDI, QuickDash, strength deficits, clinical judgment, pain   Functional Limitation Carrying, moving and handling objects   Carrying, Moving and Handling Objects Goal Status (([id number] At least 1 percent but less than 20 percent impaired, limited or restricted   Carrying, Moving and Handling Objects Discharge Status [phone] At least 1 percent but less than 20 percent impaired, limited or restricted      Problem List Patient Active Problem List   Diagnosis Date Noted  . Cervical radiculopathy at C8 03/19/2015  . Herniated cervical disc without myelopathy 03/19/2015  . Spinal stenosis in cervical region 03/19/2015  . GERD (gastroesophageal reflux disease) 07/25/2013  . Paroxysmal supraventricular tachycardia (  [place]) 07/25/2013  . Pain in the chest 07/19/2013    Blythe Stanford, SPT 04/11/2015, 3:57 PM  [facility] [address], [place], [address] Phone: [phone]   Fax:  [phone]  Name: INAYA GILLHAM MRN: [id number] Date of Birth: Dec 15, 1950

## 2015-04-13 ENCOUNTER — Encounter: Payer: PPO | Admitting: Physical Therapy

## 2015-05-01 ENCOUNTER — Ambulatory Visit (INDEPENDENT_AMBULATORY_CARE_PROVIDER_SITE_OTHER): Payer: PPO | Admitting: Neurology

## 2015-05-01 ENCOUNTER — Encounter: Payer: Self-pay | Admitting: Neurology

## 2015-05-01 VITALS — BP 138/94 | HR 70 | Resp 14 | Ht 61.4 in | Wt 155.0 lb

## 2015-05-01 DIAGNOSIS — M4802 Spinal stenosis, cervical region: Secondary | ICD-10-CM

## 2015-05-01 DIAGNOSIS — M5412 Radiculopathy, cervical region: Secondary | ICD-10-CM

## 2015-05-01 MED ORDER — METHYLPREDNISOLONE 4 MG PO TABS
ORAL_TABLET | ORAL | Status: DC
Start: 2015-05-01 — End: 2016-05-30

## 2015-05-01 NOTE — Progress Notes (Signed)
[facility]  PATIENT: Kimberly Klein DOB: [birth year]  REFERRING DOCTOR OR PCP:  Park Liter SOURCE: patient, records in EMR, MRI report and MRI images on PACS.    _________________________________   HISTORICAL  CHIEF COMPLAINT:  Chief Complaint  Patient presents with  . Cervical Radiculopathy    Sts. pain/numbness right shoulder/arm have completely resolved. She feels she has some residual weakness in right  hand. She "graduated" from physical therapy.  She has a cervical collar that PT recommended that she wears for about 30 min. each morning./fim  . Right Shoulder/Arm Pain    HISTORY OF PRESENT ILLNESS:  Kimberly Klein is a 65 year old woman with neck and right shoulder/arm pain that began in February 2017.     She did PT after her last visit and felt pain improved.   She did not beed to do the [facility].    She stopped taking muscle relaxants and anti-inflammatories a few weeks ago.  Currently, she has no neck discomfort or arm/ pain.    She still has some weakness in her right arm (twisting and gardening chores).   She has mild forearm numbness but hand numbness is better.       She is not experiencing any change in her bladder or bowel function. She has not experienced any change with strength or weakness in the legs.  I have personally reviewed the MRI of the cervical spine performed 2017. 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 There are small disc bulges at those levels. She has moderate spinal stenosis at C5-C6 and mild to moderate spinal stenosis at C6-C7. She has mild to moderate bilateral foraminal narrowing at those levels. Additionally, at C7-T1 she has a right disc extrusion. It does not appear to cause C8 nerve root compression.  REVIEW OF SYSTEMS: Constitutional: No fevers, chills, sweats, or change in appetite Eyes: No visual changes, double vision, eye pain Ear, nose and throat: No hearing loss, ear pain,  nasal congestion, sore throat Cardiovascular: No chest pain, palpitations Respiratory: No shortness of breath at rest or with exertion.   No wheezes GastrointestinaI: No nausea, vomiting, diarrhea, abdominal pain, fecal incontinence Genitourinary: No dysuria, urinary retention or frequency.  No nocturia. Musculoskeletal: as above Integumentary: No rash, pruritus, skin lesions Neurological: as above Psychiatric: No depression at this time.  No anxiety Endocrine: No palpitations, diaphoresis, change in appetite, change in weigh or increased thirst Hematologic/Lymphatic: No anemia, purpura, petechiae. Allergic/Immunologic: No itchy/runny eyes, nasal congestion, recent allergic reactions, rashes  ALLERGIES: No Known Allergies  HOME MEDICATIONS: No current outpatient prescriptions on file.  PAST MEDICAL HISTORY: Past Medical History  Diagnosis Date  . SVT (supraventricular tachycardia) ([place]) 07/2013    strees test- normal    PAST SURGICAL HISTORY: Past Surgical History  Procedure Laterality Date  . Appendectomy      FAMILY HISTORY: Family History  Problem Relation Age of Onset  . Hypertension Father   . Hyperlipidemia Father   . Heart attack Father   . Stroke Father   . Bladder Cancer Brother   . Liver cancer Sister   . Multiple myeloma Brother     SOCIAL HISTORY:  Social History   Social History  . Marital Status: Widowed    Spouse Name: N/A  . Number of Children: N/A  . Years of Education: N/A   Occupational History  . Not on file.   Social History Main Topics  . Smoking status: Former Smoker -- 59  years    Types: Cigarettes    Quit date: 10/03/2007  . Smokeless tobacco: Never Used  . Alcohol Use: 2.4 - 3.0 oz/week    4-5 Glasses of wine per week     Comment: occasional  . Drug Use: No  . Sexual Activity: Yes    Birth Control/ Protection: Post-menopausal   Other Topics Concern  . Not on file   Social History Narrative     PHYSICAL  EXAM  Filed Vitals:   05/01/15 0835  BP: 138/94  Pulse: 70  Resp: 14  Height: 5' 1.4" (1.56 m)  Weight: 155 lb (70.308 kg)    Body mass index is 28.89 kg/(m^2).   General: The patient is well-developed and well-nourished and in no acute distress  Neck: The neck is supple.  The neck is nontender.   Range of motion is normal.  Neurologic Exam  Mental status: The patient is alert and oriented x 3 at the time of the examination. The patient has apparent normal recent and remote memory, with an apparently normal attention span and concentration ability.   Speech is normal.  Cranial nerves: Extraocular movements are full.  No dysarthria is noted.  No obvious hearing deficits are noted.  Motor:  Muscle bulk is normal.   Tone is normal.    Strength is 4+/5 right triceps,  4+/5 finger extensors.  and 4+/5 in the intrinsic hand muscles.   Elsewhere, strength is  5 / 5.   Sensory: She has normal sensation to touch/vib in hands.     Coordination: Cerebellar testing reveals good finger-nose-finger bilaterally.  Gait and station: Station is normal.   Gait is normal.  Romberg is negative.   Reflexes: Deep tendon reflexes are symmetric and normal bilaterally.      DIAGNOSTIC DATA (LABS, IMAGING, TESTING) - I reviewed patient records, labs, notes, testing and imaging myself where available.  Lab Results  Component Value Date   WBC 7.0 07/18/2013   HGB 13.6 07/18/2013   HCT 41.3 07/18/2013   MCV 94 07/18/2013   PLT 196 07/18/2013      Component Value Date/Time   NA 135* 07/18/2013 1335   K 4.1 07/18/2013 1335   CL 101 07/18/2013 1335   CO2 28 07/18/2013 1335   GLUCOSE 105* 07/18/2013 1335   BUN 7 07/18/2013 1335   CREATININE 0.94 07/18/2013 1335   CALCIUM 9.3 07/18/2013 1335   PROT 7.5 07/18/2013 1335   ALBUMIN 3.7 07/18/2013 1335   AST 35 07/18/2013 1335   ALT 34 07/18/2013 1335   ALKPHOS 79 07/18/2013 1335   BILITOT 0.7 07/18/2013 1335   GFRNONAA >60 07/18/2013 1335    GFRAA >60 07/18/2013 1335       ASSESSMENT AND PLAN  Cervical radiculopathy at C8  Spinal stenosis in cervical region   1.  She is doing much better and is encouraged to continue to do the exercises that she learned in physical therapy. 2,   she is off all medications. I did give her a prescription for a 6 day steroid pack that she can have one available if pain flares up or if she travels. 3.   She will return to see me as needed if she has an increase in symptoms.     Andriel Omalley A. [name] Shelling, MD, PhD 5/39/7673, 4:19 AM Certified in Neurology, Clinical Neurophysiology, Sleep Medicine, Pain Medicine and Neuroimaging  [facility] [address], [place], [place] [address] [phone]

## 2016-05-30 ENCOUNTER — Ambulatory Visit (INDEPENDENT_AMBULATORY_CARE_PROVIDER_SITE_OTHER): Payer: PPO | Admitting: Family Medicine

## 2016-05-30 ENCOUNTER — Encounter: Payer: Self-pay | Admitting: Family Medicine

## 2016-05-30 VITALS — BP 155/84 | HR 82 | Temp 97.7°F | Ht 61.6 in | Wt 161.0 lb

## 2016-05-30 DIAGNOSIS — R030 Elevated blood-pressure reading, without diagnosis of hypertension: Secondary | ICD-10-CM

## 2016-05-30 DIAGNOSIS — Z13220 Encounter for screening for lipoid disorders: Secondary | ICD-10-CM

## 2016-05-30 DIAGNOSIS — R739 Hyperglycemia, unspecified: Secondary | ICD-10-CM | POA: Diagnosis not present

## 2016-05-30 DIAGNOSIS — Z87891 Personal history of nicotine dependence: Secondary | ICD-10-CM | POA: Insufficient documentation

## 2016-05-30 DIAGNOSIS — I471 Supraventricular tachycardia, unspecified: Secondary | ICD-10-CM

## 2016-05-30 DIAGNOSIS — Z0000 Encounter for general adult medical examination without abnormal findings: Secondary | ICD-10-CM

## 2016-05-30 DIAGNOSIS — Z1239 Encounter for other screening for malignant neoplasm of breast: Secondary | ICD-10-CM

## 2016-05-30 LAB — BAYER DCA HB A1C WAIVED: HB A1C (BAYER DCA - WAIVED): 4.9 % (ref <7.0)

## 2016-05-30 LAB — UA/M W/RFLX CULTURE, ROUTINE
BILIRUBIN UA: NEGATIVE
Glucose, UA: NEGATIVE
Leukocytes, UA: NEGATIVE
Nitrite, UA: NEGATIVE
PROTEIN UA: NEGATIVE
RBC UA: NEGATIVE
UUROB: 0.2 mg/dL (ref 0.2–1.0)
pH, UA: 5 (ref 5.0–7.5)

## 2016-05-30 LAB — MICROSCOPIC EXAMINATION: Bacteria, UA: NONE SEEN

## 2016-05-30 LAB — MICROALBUMIN, URINE WAIVED
Creatinine, Urine Waived: 50 mg/dL (ref 10–300)
Microalb, Ur Waived: 10 mg/L (ref 0–19)

## 2016-05-30 NOTE — Assessment & Plan Note (Signed)
Has had no problems. Continue to monitor. Call with any concerns.

## 2016-05-30 NOTE — Assessment & Plan Note (Signed)
Will check labs. Await results. Call with any concerns.  

## 2016-05-30 NOTE — Progress Notes (Signed)
BP (!) 155/84   Pulse 82   Temp 97.7 F (36.5 C)   Ht 5' 1.6" (1.565 m)   Wt 161 lb (73 kg)   SpO2 98%   BMI 29.83 kg/m    Subjective:    Patient ID: Kimberly Klein, female    DOB: 29-Jul-1950, 66 y.o.   MRN: [id number]  HPI: Kimberly Klein is a 66 y.o. female presenting on 05/30/2016 for comprehensive medical examination. Current medical complaints include:  Saw neurology after her last appointment- Disk bulge at C7-T1 on MRI, sent for PT by neurology who was considering an epidural steroid injection if not better. She did much better with the PT. Feeling back to herself now.   ELEVATED BLOOD PRESSURE Duration of elevated BP: chronic BP monitoring frequency: a few times a week BP range: 140s-150s/80s-90s Previous BP meds: no Recent stressors: yes Family history of hypertension: yes Recurrent headaches: no Visual changes: no Palpitations: no  Dyspnea: no Chest pain: no Lower extremity edema: no Dizzy/lightheaded: no Transient ischemic attacks: no   Menopausal Symptoms: no  Functional Status Survey: Is the patient deaf or have difficulty hearing?: No Does the patient have difficulty seeing, even when wearing glasses/contacts?: No Does the patient have difficulty concentrating, remembering, or making decisions?: No Does the patient have difficulty walking or climbing stairs?: No Does the patient have difficulty dressing or bathing?: No Does the patient have difficulty doing errands alone such as visiting a doctor's office or shopping?: No  Fall Risk  05/30/2016  Falls in the past year? No    Depression Screen Depression screen [facility] 2/9 05/30/2016  Decreased Interest 0  Down, Depressed, Hopeless 0  PHQ - 2 Score 0   Advanced Directives Does patient have a HCPOA?    no Does patient have a living will or MOST form?  no  Past Medical History:  Past Medical History:  Diagnosis Date  . SVT (supraventricular tachycardia) ([place]) 07/2013   strees test- normal     Surgical History:  Past Surgical History:  Procedure Laterality Date  . appendectomy      Medications:  No current outpatient prescriptions on file prior to visit.   No current facility-administered medications on file prior to visit.     Allergies:  No Known Allergies  Social History:  Social History   Social History  . Marital status: Widowed    Spouse name: N/A  . Number of children: N/A  . Years of education: N/A   Occupational History  . Not on file.   Social History Main Topics  . Smoking status: Former Smoker    Years: 20.00    Types: Cigarettes    Quit date: 10/03/2007  . Smokeless tobacco: Never Used  . Alcohol use 2.4 - 3.0 oz/week    4 - 5 Glasses of wine per week     Comment: occasional  . Drug use: No  . Sexual activity: Not Currently    Birth control/ protection: Post-menopausal   Other Topics Concern  . Not on file   Social History Narrative  . No narrative on file   History  Smoking Status  . Former Smoker  . Years: 20.00  . Types: Cigarettes  . Quit date: 10/03/2007  Smokeless Tobacco  . Never Used   History  Alcohol Use  . 2.4 - 3.0 oz/week  . 4 - 5 Glasses of wine per week    Comment: occasional    Family History:  Family History  Problem  Relation Age of Onset  . Hypertension Father   . Hyperlipidemia Father   . Heart attack Father   . Stroke Father   . Bladder Cancer Brother   . Liver cancer Sister   . Multiple myeloma Brother     Past medical history, surgical history, medications, allergies, family history and social history reviewed with patient today and changes made to appropriate areas of the chart.   Review of Systems  Constitutional: Negative.   HENT: Negative.   Eyes: Negative.   Respiratory: Negative.   Cardiovascular: Positive for palpitations. Negative for chest pain, orthopnea, claudication, leg swelling and PND.  Gastrointestinal: Negative.   Genitourinary: Negative.   Musculoskeletal: Negative.    Skin: Negative.   Neurological: Positive for tingling. Negative for dizziness, tremors, sensory change, speech change, focal weakness, seizures, loss of consciousness and headaches.  Endo/Heme/Allergies: Negative.   Psychiatric/Behavioral: Negative.     All other ROS negative except what is listed above and in the HPI.      Objective:    BP (!) 155/84   Pulse 82   Temp 97.7 F (36.5 C)   Ht 5' 1.6" (1.565 m)   Wt 161 lb (73 kg)   SpO2 98%   BMI 29.83 kg/m   Wt Readings from Last 3 Encounters:  05/30/16 161 lb (73 kg)  05/01/15 155 lb (70.3 kg)  03/19/15 160 lb (72.6 kg)     Hearing Screening   '125Hz'  '250Hz'  '500Hz'  '1000Hz'  '2000Hz'  '3000Hz'  '4000Hz'  '6000Hz'  '8000Hz'   Right ear:   '20 20 20  20    ' Left ear:   '20 20 20  20      ' Visual Acuity Screening   Right eye Left eye Both eyes  Without correction:     With correction: '20/50 20/40 20/40 '    Physical Exam  Constitutional: She is oriented to person, place, and time. She appears well-developed and well-nourished. No distress.  HENT:  Head: Normocephalic and atraumatic.  Right Ear: Hearing, tympanic membrane, external ear and ear canal normal.  Left Ear: Hearing, tympanic membrane, external ear and ear canal normal.  Nose: Nose normal.  Mouth/Throat: Uvula is midline, oropharynx is clear and moist and mucous membranes are normal. No oropharyngeal exudate.  Eyes: Conjunctivae, EOM and lids are normal. Pupils are equal, round, and reactive to light. Right eye exhibits no discharge. Left eye exhibits no discharge. No scleral icterus.  Neck: Normal range of motion. Neck supple. No JVD present. No tracheal deviation present. No thyromegaly present.  Cardiovascular: Normal rate, regular rhythm, normal heart sounds and intact distal pulses.  Exam reveals no gallop and no friction rub.   No murmur heard. Pulmonary/Chest: Effort normal and breath sounds normal. No stridor. No respiratory distress. She has no wheezes. She has no rales. She  exhibits no tenderness.  Abdominal: Soft. Bowel sounds are normal. She exhibits no distension and no mass. There is no tenderness. There is no rebound and no guarding.  Genitourinary:  Genitourinary Comments: Breast and pelvic exams deferred with shared decision making  Musculoskeletal: Normal range of motion. She exhibits no edema, tenderness or deformity.  Lymphadenopathy:    She has no cervical adenopathy.  Neurological: She is alert and oriented to person, place, and time. She has normal reflexes. She displays normal reflexes. No cranial nerve deficit. She exhibits normal muscle tone. Coordination normal.  Skin: Skin is warm, dry and intact. No rash noted. She is not diaphoretic. No erythema. No pallor.  Psychiatric: She has a normal  mood and affect. Her speech is normal and behavior is normal. Judgment and thought content normal. Cognition and memory are normal.  Nursing note and vitals reviewed.   6CIT Screen 05/30/2016  What Year? 0 points  What month? 0 points  What time? 0 points  Count back from 20 0 points  Months in reverse 0 points  Repeat phrase 0 points  Total Score 0     Results for orders placed or performed in visit on 07/18/13  Comprehensive metabolic panel  Result Value Ref Range   Glucose 105 (H) 65 - 99 mg/dL   BUN 7 7 - 18 mg/dL   Creatinine 0.94 0.60 - 1.30 mg/dL   Sodium 135 (L) 136 - 145 mmol/L   Potassium 4.1 3.5 - 5.1 mmol/L   Chloride 101 98 - 107 mmol/L   Co2 28 21 - 32 mmol/L   Calcium, Total 9.3 8.5 - 10.1 mg/dL   SGOT(AST) 35 15 - 37 Unit/L   SGPT (ALT) 34 12 - 78 U/L   Alkaline Phosphatase 79 Unit/L   Albumin 3.7 3.4 - 5.0 g/dL   Total Protein 7.5 6.4 - 8.2 g/dL   Bilirubin,Total 0.7 0.2 - 1.0 mg/dL   Osmolality 268 275 - 301   Anion Gap 6 (L) 7 - 16   EGFR (African American) >60    EGFR (Non-African Amer.) >60   CBC  Result Value Ref Range   WBC 7.0 3.6 - 11.0 x10 3/mm 3   RBC 4.38 3.80 - 5.20 X10 6/mm 3   HGB 13.6 12.0 - 16.0 g/dL    HCT 41.3 35.0 - 47.0 %   MCV 94 80 - 100 fL   MCH 31.0 26.0 - 34.0 pg   MCHC 32.8 32.0 - 36.0 g/dL   RDW 13.3 11.5 - 14.5 %   Platelet 196 150 - 440 x10 3/mm 3  Troponin I  Result Value Ref Range   Troponin-I < 0.02 ng/mL  D-Dimer [facility])  Result Value Ref Range   D-Dimer 312 ng/ml      Assessment & Plan:   Problem List Items Addressed This Visit      Cardiovascular and Mediastinum   Paroxysmal supraventricular tachycardia (HCC)    Has had no problems. Continue to monitor. Call with any concerns.         Other   History of tobacco abuse    Will check labs. Await results. Call with any concerns.       Relevant Orders   UA/M w/rflx Culture, Routine   Elevated blood pressure reading in office with white coat syndrome, without diagnosis of hypertension    Will work on [employer]. Continue diet and exercise. Recheck 3 months. Call with any concerns.       Relevant Orders   CBC with Differential/Platelet   Comprehensive metabolic panel   Microalbumin, Urine Waived   TSH   UA/M w/rflx Culture, Routine    Other Visit Diagnoses    Medicare annual wellness visit, subsequent    -  Primary   Prevenative care discussed as below. Call with any concerns.    Screening for breast cancer       Mammogram ordered today.   Relevant Orders   MM Digital Screening   Screening for cholesterol level       Labs drawn today, await results.    Relevant Orders   Lipid Panel w/o Chol/HDL Ratio   Hyperglycemia       Labs drawn today, await  results.    Relevant Orders   Bayer DCA Hb A1c Waived   CBC with Differential/Platelet   Comprehensive metabolic panel       Preventative Services:  Health Risk Assessment and Personalized Prevention Plan: Done today Bone Mass Measurements: Up to date Breast Cancer Screening: Ordered today CVD Screening: Done today Cervical Cancer Screening: N/A Colon Cancer Screening: up to date Depression Screening: done today Diabetes Screening: done  today Glaucoma Screening: See your eye doctor Hepatitis B vaccine: N/A Hepatitis C screening: Declined HIV Screening: Declined Flu Vaccine: Get in October Lung cancer Screening: N/A Obesity Screening: Done today Pneumonia Vaccines (2): Prevnar up to date- due for pneumovax- consider next visit STI Screening: N/A  Follow up plan: Return in about 3 months (around 08/30/2016) for Follow up BP.   LABORATORY TESTING:  - Pap smear: not applicable  IMMUNIZATIONS:   - Tdap: Tetanus vaccination status reviewed: last tetanus booster within 10 years. - Influenza: Postponed to flu season - Pneumovax: Refused - Prevnar: Administered today - Zostavax vaccine: Refused  SCREENING: -Mammogram: Ordered today  - Colonoscopy: Up to date  - Bone Density: Up to date  -Hearing Test: Ordered today  -Spirometry: Not applicable   PATIENT COUNSELING:   Advised to take 1 mg of folate supplement per day if capable of pregnancy.   Sexuality: Discussed sexually transmitted diseases, partner selection, use of condoms, avoidance of unintended pregnancy  and contraceptive alternatives.   Advised to avoid cigarette smoking.  I discussed with the patient that most people either abstain from alcohol or drink within safe limits (<=14/week and <=4 drinks/occasion for males, <=7/weeks and <= 3 drinks/occasion for females) and that the risk for alcohol disorders and other health effects rises proportionally with the number of drinks per week and how often a drinker exceeds daily limits.  Discussed cessation/primary prevention of drug use and availability of treatment for abuse.   Diet: Encouraged to adjust caloric intake to maintain  or achieve ideal body weight, to reduce intake of dietary saturated fat and total fat, to limit sodium intake by avoiding high sodium foods and not adding table salt, and to maintain adequate dietary potassium and calcium preferably from fresh fruits, vegetables, and low-fat dairy  products.    stressed the importance of regular exercise  Injury prevention: Discussed safety belts, safety helmets, smoke detector, smoking near bedding or upholstery.   Dental health: Discussed importance of regular tooth brushing, flossing, and dental visits.    NEXT PREVENTATIVE PHYSICAL DUE IN 1 YEAR. Return in about 3 months (around 08/30/2016) for Follow up BP.

## 2016-05-30 NOTE — Assessment & Plan Note (Signed)
Will work on [employer]. Continue diet and exercise. Recheck 3 months. Call with any concerns.

## 2016-05-30 NOTE — Patient Instructions (Addendum)
Preventative Services:  Health Risk Assessment and Personalized Prevention Plan: Done today Bone Mass Measurements: Up to date Breast Cancer Screening: Ordered today CVD Screening: Done today Cervical Cancer Screening: N/A Colon Cancer Screening: up to date Depression Screening: done today Diabetes Screening: done today Glaucoma Screening: See your eye doctor Hepatitis B vaccine: N/A Hepatitis C screening: Declined HIV Screening: Declined Flu Vaccine: Get in October Lung cancer Screening: N/A Obesity Screening: Done today Pneumonia Vaccines (2): Prevnar up to date- due for pneumovax- consider next visit STI Screening: N/A Health Maintenance, Female Adopting a healthy lifestyle and getting preventive care can go a long way to promote health and wellness. Talk with your health care provider about what schedule of regular examinations is right for you. This is a good chance for you to check in with your provider about disease prevention and staying healthy. In between checkups, there are plenty of things you can do on your own. Experts have done a lot of research about which lifestyle changes and preventive measures are most likely to keep you healthy. Ask your health care provider for more information. Weight and diet Eat a healthy diet  Be sure to include plenty of vegetables, fruits, low-fat dairy products, and lean protein.  Do not eat a lot of foods high in solid fats, added sugars, or salt.  Get regular exercise. This is one of the most important things you can do for your health.  Most adults should exercise for at least 150 minutes each week. The exercise should increase your heart rate and make you sweat (moderate-intensity exercise).  Most adults should also do strengthening exercises at least twice a week. This is in addition to the moderate-intensity exercise. Maintain a healthy weight  Body mass index (BMI) is a measurement that can be used to identify possible weight  problems. It estimates body fat based on height and weight. Your health care provider can help determine your BMI and help you achieve or maintain a healthy weight.  For females 72 years of age and older:  A BMI below 18.5 is considered underweight.  A BMI of 18.5 to 24.9 is normal.  A BMI of 25 to 29.9 is considered overweight.  A BMI of 30 and above is considered obese. Watch levels of cholesterol and blood lipids  You should start having your blood tested for lipids and cholesterol at 68 years of age, then have this test every 5 years.  You may need to have your cholesterol levels checked more often if:  Your lipid or cholesterol levels are high.  You are older than 68 years of age.  You are at high risk for heart disease. Cancer screening Lung Cancer  Lung cancer screening is recommended for adults 41-39 years old who are at high risk for lung cancer because of a history of smoking.  A yearly low-dose CT scan of the lungs is recommended for people who:  Currently smoke.  Have quit within the past 15 years.  Have at least a 30-pack-year history of smoking. A pack year is smoking an average of one pack of cigarettes a day for 1 year.  Yearly screening should continue until it has been 15 years since you quit.  Yearly screening should stop if you develop a health problem that would prevent you from having lung cancer treatment. Breast Cancer  Practice breast self-awareness. This means understanding how your breasts normally appear and feel.  It also means doing regular breast self-exams. Let your health care provider  know about any changes, no matter how small.  If you are in your 20s or 30s, you should have a clinical breast exam (CBE) by a health care provider every 1-3 years as part of a regular health exam.  If you are 26 or older, have a CBE every year. Also consider having a breast X-ray (mammogram) every year.  If you have a family history of breast cancer,  talk to your health care provider about genetic screening.  If you are at high risk for breast cancer, talk to your health care provider about having an MRI and a mammogram every year.  Breast cancer gene (BRCA) assessment is recommended for women who have family members with BRCA-related cancers. BRCA-related cancers include:  Breast.  Ovarian.  Tubal.  Peritoneal cancers.  Results of the assessment will determine the need for genetic counseling and BRCA1 and BRCA2 testing. Cervical Cancer  Your health care provider may recommend that you be screened regularly for cancer of the pelvic organs (ovaries, uterus, and vagina). This screening involves a pelvic examination, including checking for microscopic changes to the surface of your cervix (Pap test). You may be encouraged to have this screening done every 3 years, beginning at age 25.  For women ages 85-65, health care providers may recommend pelvic exams and Pap testing every 3 years, or they may recommend the Pap and pelvic exam, combined with testing for human papilloma virus (HPV), every 5 years. Some types of HPV increase your risk of cervical cancer. Testing for HPV may also be done on women of any age with unclear Pap test results.  Other health care providers may not recommend any screening for nonpregnant women who are considered low risk for pelvic cancer and who do not have symptoms. Ask your health care provider if a screening pelvic exam is right for you.  If you have had past treatment for cervical cancer or a condition that could lead to cancer, you need Pap tests and screening for cancer for at least 20 years after your treatment. If Pap tests have been discontinued, your risk factors (such as having a new sexual partner) need to be reassessed to determine if screening should resume. Some women have medical problems that increase the chance of getting cervical cancer. In these cases, your health care provider may recommend more  frequent screening and Pap tests. Colorectal Cancer  This type of cancer can be detected and often prevented.  Routine colorectal cancer screening usually begins at 68 years of age and continues through 68 years of age.  Your health care provider may recommend screening at an earlier age if you have risk factors for colon cancer.  Your health care provider may also recommend using home test kits to check for hidden blood in the stool.  A small camera at the end of a tube can be used to examine your colon directly (sigmoidoscopy or colonoscopy). This is done to check for the earliest forms of colorectal cancer.  Routine screening usually begins at age 67.  Direct examination of the colon should be repeated every 5-10 years through 68 years of age. However, you may need to be screened more often if early forms of precancerous polyps or small growths are found. Skin Cancer  Check your skin from head to toe regularly.  Tell your health care provider about any new moles or changes in moles, especially if there is a change in a mole's shape or color.  Also tell your health care provider  if you have a mole that is larger than the size of a pencil eraser.  Always use sunscreen. Apply sunscreen liberally and repeatedly throughout the day.  Protect yourself by wearing long sleeves, pants, a wide-brimmed hat, and sunglasses whenever you are outside. Heart disease, diabetes, and high blood pressure  High blood pressure causes heart disease and increases the risk of stroke. High blood pressure is more likely to develop in:  People who have blood pressure in the high end of the normal range (130-139/85-89 mm Hg).  People who are overweight or obese.  People who are African American.  If you are 16-28 years of age, have your blood pressure checked every 3-5 years. If you are 50 years of age or older, have your blood pressure checked every year. You should have your blood pressure measured  twice-once when you are at a hospital or clinic, and once when you are not at a hospital or clinic. Record the average of the two measurements. To check your blood pressure when you are not at a hospital or clinic, you can use:  An automated blood pressure machine at a pharmacy.  A home blood pressure monitor.  If you are between 15 years and 15 years old, ask your health care provider if you should take aspirin to prevent strokes.  Have regular diabetes screenings. This involves taking a blood sample to check your fasting blood sugar level.  If you are at a normal weight and have a low risk for diabetes, have this test once every three years after 68 years of age.  If you are overweight and have a high risk for diabetes, consider being tested at a younger age or more often. Preventing infection Hepatitis B  If you have a higher risk for hepatitis B, you should be screened for this virus. You are considered at high risk for hepatitis B if:  You were born in a country where hepatitis B is common. Ask your health care provider which countries are considered high risk.  Your parents were born in a high-risk country, and you have not been immunized against hepatitis B (hepatitis B vaccine).  You have HIV or AIDS.  You use needles to inject street drugs.  You live with someone who has hepatitis B.  You have had sex with someone who has hepatitis B.  You get hemodialysis treatment.  You take certain medicines for conditions, including cancer, organ transplantation, and autoimmune conditions. Hepatitis C  Blood testing is recommended for:  Everyone born from [birth year] through 1967.  Anyone with known risk factors for hepatitis C. Sexually transmitted infections (STIs)  You should be screened for sexually transmitted infections (STIs) including gonorrhea and chlamydia if:  You are sexually active and are younger than 68 years of age.  You are older than 68 years of age and your  health care provider tells you that you are at risk for this type of infection.  Your sexual activity has changed since you were last screened and you are at an increased risk for chlamydia or gonorrhea. Ask your health care provider if you are at risk.  If you do not have HIV, but are at risk, it may be recommended that you take a prescription medicine daily to prevent HIV infection. This is called pre-exposure prophylaxis (PrEP). You are considered at risk if:  You are sexually active and do not regularly use condoms or know the HIV status of your partner(s).  You take drugs by injection.  You are sexually active with a partner who has HIV. Talk with your health care provider about whether you are at high risk of being infected with HIV. If you choose to begin PrEP, you should first be tested for HIV. You should then be tested every 3 months for as long as you are taking PrEP. Pregnancy  If you are premenopausal and you may become pregnant, ask your health care provider about preconception counseling.  If you may become pregnant, take 400 to 800 micrograms (mcg) of folic acid every day.  If you want to prevent pregnancy, talk to your health care provider about birth control (contraception). Osteoporosis and menopause  Osteoporosis is a disease in which the bones lose minerals and strength with aging. This can result in serious bone fractures. Your risk for osteoporosis can be identified using a bone density scan.  If you are 56 years of age or older, or if you are at risk for osteoporosis and fractures, ask your health care provider if you should be screened.  Ask your health care provider whether you should take a calcium or vitamin D supplement to lower your risk for osteoporosis.  Menopause may have certain physical symptoms and risks.  Hormone replacement therapy may reduce some of these symptoms and risks. Talk to your health care provider about whether hormone replacement  therapy is right for you. Follow these instructions at home:  Schedule regular health, dental, and eye exams.  Stay current with your immunizations.  Do not use any tobacco products including cigarettes, chewing tobacco, or electronic cigarettes.  If you are pregnant, do not drink alcohol.  If you are breastfeeding, limit how much and how often you drink alcohol.  Limit alcohol intake to no more than 1 drink per day for nonpregnant women. One drink equals 12 ounces of beer, 5 ounces of wine, or 1 ounces of hard liquor.  Do not use street drugs.  Do not share needles.  Ask your health care provider for help if you need support or information about quitting drugs.  Tell your health care provider if you often feel depressed.  Tell your health care provider if you have ever been abused or do not feel safe at home. This information is not intended to replace advice given to you by your health care provider. Make sure you discuss any questions you have with your health care provider. Document Released: 07/15/2010 Document Revised: 06/07/2015 Document Reviewed: 10/03/2014 Elsevier Interactive Patient Education  2017 [place] Maintenance for Postmenopausal Women Menopause is a normal process in which your reproductive ability comes to an end. This process happens gradually over a span of months to years, usually between the ages of 70 and 48. Menopause is complete when you have missed 12 consecutive menstrual periods. It is important to talk with your health care provider about some of the most common conditions that affect postmenopausal women, such as heart disease, cancer, and bone loss (osteoporosis). Adopting a healthy lifestyle and getting preventive care can help to promote your health and wellness. Those actions can also lower your chances of developing some of these common conditions. What should I know about menopause? During menopause, you may experience a number of  symptoms, such as:  Moderate-to-severe hot flashes.  Night sweats.  Decrease in sex drive.  Mood swings.  Headaches.  Tiredness.  Irritability.  Memory problems.  Insomnia. Choosing to treat or not to treat menopausal changes is an individual decision that you make with your  health care provider. What should I know about hormone replacement therapy and supplements? Hormone therapy products are effective for treating symptoms that are associated with menopause, such as hot flashes and night sweats. Hormone replacement carries certain risks, especially as you become older. If you are thinking about using estrogen or estrogen with progestin treatments, discuss the benefits and risks with your health care provider. What should I know about heart disease and stroke? Heart disease, heart attack, and stroke become more likely as you age. This may be due, in part, to the hormonal changes that your body experiences during menopause. These can affect how your body processes dietary fats, triglycerides, and cholesterol. Heart attack and stroke are both medical emergencies. There are many things that you can do to help prevent heart disease and stroke:  Have your blood pressure checked at least every 1-2 years. High blood pressure causes heart disease and increases the risk of stroke.  If you are 7-36 years old, ask your health care provider if you should take aspirin to prevent a heart attack or a stroke.  Do not use any tobacco products, including cigarettes, chewing tobacco, or electronic cigarettes. If you need help quitting, ask your health care provider.  It is important to eat a healthy diet and maintain a healthy weight.  Be sure to include plenty of vegetables, fruits, low-fat dairy products, and lean protein.  Avoid eating foods that are high in solid fats, added sugars, or salt (sodium).  Get regular exercise. This is one of the most important things that you can do for your  health.  Try to exercise for at least 150 minutes each week. The type of exercise that you do should increase your heart rate and make you sweat. This is known as moderate-intensity exercise.  Try to do strengthening exercises at least twice each week. Do these in addition to the moderate-intensity exercise.  Know your numbers.Ask your health care provider to check your cholesterol and your blood glucose. Continue to have your blood tested as directed by your health care provider. What should I know about cancer screening? There are several types of cancer. Take the following steps to reduce your risk and to catch any cancer development as early as possible. Breast Cancer  Practice breast self-awareness.  This means understanding how your breasts normally appear and feel.  It also means doing regular breast self-exams. Let your health care provider know about any changes, no matter how small.  If you are 19 or older, have a clinician do a breast exam (clinical breast exam or CBE) every year. Depending on your age, family history, and medical history, it may be recommended that you also have a yearly breast X-ray (mammogram).  If you have a family history of breast cancer, talk with your health care provider about genetic screening.  If you are at high risk for breast cancer, talk with your health care provider about having an MRI and a mammogram every year.  Breast cancer (BRCA) gene test is recommended for women who have family members with BRCA-related cancers. Results of the assessment will determine the need for genetic counseling and BRCA1 and for BRCA2 testing. BRCA-related cancers include these types:  Breast. This occurs in males or females.  Ovarian.  Tubal. This may also be called fallopian tube cancer.  Cancer of the abdominal or pelvic lining (peritoneal cancer).  Prostate.  Pancreatic. Cervical, Uterine, and Ovarian Cancer  Your health care provider may recommend  that you be screened  regularly for cancer of the pelvic organs. These include your ovaries, uterus, and vagina. This screening involves a pelvic exam, which includes checking for microscopic changes to the surface of your cervix (Pap test).  For women ages 21-65, health care providers may recommend a pelvic exam and a Pap test every three years. For women ages 54-65, they may recommend the Pap test and pelvic exam, combined with testing for human papilloma virus (HPV), every five years. Some types of HPV increase your risk of cervical cancer. Testing for HPV may also be done on women of any age who have unclear Pap test results.  Other health care providers may not recommend any screening for nonpregnant women who are considered low risk for pelvic cancer and have no symptoms. Ask your health care provider if a screening pelvic exam is right for you.  If you have had past treatment for cervical cancer or a condition that could lead to cancer, you need Pap tests and screening for cancer for at least 20 years after your treatment. If Pap tests have been discontinued for you, your risk factors (such as having a new sexual partner) need to be reassessed to determine if you should start having screenings again. Some women have medical problems that increase the chance of getting cervical cancer. In these cases, your health care provider may recommend that you have screening and Pap tests more often.  If you have a family history of uterine cancer or ovarian cancer, talk with your health care provider about genetic screening.  If you have vaginal bleeding after reaching menopause, tell your health care provider.  There are currently no reliable tests available to screen for ovarian cancer. Lung Cancer  Lung cancer screening is recommended for adults 38-77 years old who are at high risk for lung cancer because of a history of smoking. A yearly low-dose CT scan of the lungs is recommended if you:  Currently  smoke.  Have a history of at least 30 pack-years of smoking and you currently smoke or have quit within the past 15 years. A pack-year is smoking an average of one pack of cigarettes per day for one year. Yearly screening should:  Continue until it has been 15 years since you quit.  Stop if you develop a health problem that would prevent you from having lung cancer treatment. Colorectal Cancer  This type of cancer can be detected and can often be prevented.  Routine colorectal cancer screening usually begins at age 8 and continues through age 2.  If you have risk factors for colon cancer, your health care provider may recommend that you be screened at an earlier age.  If you have a family history of colorectal cancer, talk with your health care provider about genetic screening.  Your health care provider may also recommend using home test kits to check for hidden blood in your stool.  A small camera at the end of a tube can be used to examine your colon directly (sigmoidoscopy or colonoscopy). This is done to check for the earliest forms of colorectal cancer.  Direct examination of the colon should be repeated every 5-10 years until age 69. However, if early forms of precancerous polyps or small growths are found or if you have a family history or genetic risk for colorectal cancer, you may need to be screened more often. Skin Cancer  Check your skin from head to toe regularly.  Monitor any moles. Be sure to tell your health care provider:  About any new moles or changes in moles, especially if there is a change in a mole's shape or color.  If you have a mole that is larger than the size of a pencil eraser.  If any of your family members has a history of skin cancer, especially at a young age, talk with your health care provider about genetic screening.  Always use sunscreen. Apply sunscreen liberally and repeatedly throughout the day.  Whenever you are outside, protect  yourself by wearing long sleeves, pants, a wide-brimmed hat, and sunglasses. What should I know about osteoporosis? Osteoporosis is a condition in which bone destruction happens more quickly than new bone creation. After menopause, you may be at an increased risk for osteoporosis. To help prevent osteoporosis or the bone fractures that can happen because of osteoporosis, the following is recommended:  If you are 30-49 years old, get at least 1,000 mg of calcium and at least 600 mg of vitamin D per day.  If you are older than age 68 but younger than age 44, get at least 1,200 mg of calcium and at least 600 mg of vitamin D per day.  If you are older than age 87, get at least 1,200 mg of calcium and at least 800 mg of vitamin D per day. Smoking and excessive alcohol intake increase the risk of osteoporosis. Eat foods that are rich in calcium and vitamin D, and do weight-bearing exercises several times each week as directed by your health care provider. What should I know about how menopause affects my mental health? Depression may occur at any age, but it is more common as you become older. Common symptoms of depression include:  Low or sad mood.  Changes in sleep patterns.  Changes in appetite or eating patterns.  Feeling an overall lack of motivation or enjoyment of activities that you previously enjoyed.  Frequent crying spells. Talk with your health care provider if you think that you are experiencing depression. What should I know about immunizations? It is important that you get and maintain your immunizations. These include:  Tetanus, diphtheria, and pertussis (Tdap) booster vaccine.  Influenza every year before the flu season begins.  Pneumonia vaccine.  Shingles vaccine. Your health care provider may also recommend other immunizations. This information is not intended to replace advice given to you by your health care provider. Make sure you discuss any questions you have with  your health care provider. Document Released: 02/21/2005 Document Revised: 07/20/2015 Document Reviewed: 10/03/2014 Elsevier Interactive Patient Education  2017 [place] DASH stands for "Dietary Approaches to Stop Hypertension." The DASH eating plan is a healthy eating plan that has been shown to reduce high blood pressure (hypertension). It may also reduce your risk for type 2 diabetes, heart disease, and stroke. The DASH eating plan may also help with weight loss. What are tips for following this plan? General guidelines   Avoid eating more than 2,300 mg (milligrams) of salt (sodium) a day. If you have hypertension, you may need to reduce your sodium intake to 1,500 mg a day.  Limit alcohol intake to no more than 1 drink a day for nonpregnant women and 2 drinks a day for men. One drink equals 12 oz of beer, 5 oz of wine, or 1 oz of hard liquor.  Work with your health care provider to maintain a healthy body weight or to lose weight. Ask what an ideal weight is for you.  Get at least 30 minutes  of exercise that causes your heart to beat faster (aerobic exercise) most days of the week. Activities may include walking, swimming, or biking.  Work with your health care provider or diet and nutrition specialist (dietitian) to adjust your eating plan to your individual calorie needs. Reading food labels   Check food labels for the amount of sodium per serving. Choose foods with less than 5 percent of the Daily Value of sodium. Generally, foods with less than 300 mg of sodium per serving fit into this eating plan.  To find whole grains, look for the word "whole" as the first word in the ingredient list. Shopping   Buy products labeled as "low-sodium" or "no salt added."  Buy fresh foods. Avoid canned foods and premade or frozen meals. Cooking   Avoid adding salt when cooking. Use salt-free seasonings or herbs instead of table salt or sea salt. Check with your health  care provider or pharmacist before using salt substitutes.  Do not fry foods. Cook foods using healthy methods such as baking, boiling, grilling, and broiling instead.  Cook with heart-healthy oils, such as olive, canola, soybean, or sunflower oil. Meal planning    Eat a balanced diet that includes:  5 or more servings of fruits and vegetables each day. At each meal, try to fill half of your plate with fruits and vegetables.  Up to 6-8 servings of whole grains each day.  Less than 6 oz of lean meat, poultry, or fish each day. A 3-oz serving of meat is about the same size as a deck of cards. One egg equals 1 oz.  2 servings of low-fat dairy each day.  A serving of nuts, seeds, or beans 5 times each week.  Heart-healthy fats. Healthy fats called Omega-3 fatty acids are found in foods such as flaxseeds and coldwater fish, like sardines, salmon, and mackerel.  Limit how much you eat of the following:  Canned or prepackaged foods.  Food that is high in trans fat, such as fried foods.  Food that is high in saturated fat, such as fatty meat.  Sweets, desserts, sugary drinks, and other foods with added sugar.  Full-fat dairy products.  Do not salt foods before eating.  Try to eat at least 2 vegetarian meals each week.  Eat more home-cooked food and less restaurant, buffet, and fast food.  When eating at a restaurant, ask that your food be prepared with less salt or no salt, if possible. What foods are recommended? The items listed may not be a complete list. Talk with your dietitian about what dietary choices are best for you. Grains  Whole-grain or whole-wheat bread. Whole-grain or whole-wheat pasta. Brown rice. [place]. Bulgur. Whole-grain and low-sodium cereals. Pita bread. Low-fat, low-sodium crackers. Whole-wheat flour tortillas. Vegetables  Fresh or frozen vegetables (raw, steamed, roasted, or grilled). Low-sodium or reduced-sodium tomato and vegetable juice.  Low-sodium or reduced-sodium tomato sauce and tomato paste. Low-sodium or reduced-sodium canned vegetables. Fruits  All fresh, dried, or frozen fruit. Canned fruit in natural juice (without added sugar). Meat and other protein foods  Skinless chicken or [place]. Ground chicken or [place]. Pork with fat trimmed off. Fish and seafood. Egg whites. Dried beans, peas, or lentils. Unsalted nuts, nut butters, and seeds. Unsalted canned beans. Lean cuts of beef with fat trimmed off. Low-sodium, lean deli meat. Dairy  Low-fat (1%) or fat-free (skim) milk. Fat-free, low-fat, or reduced-fat cheeses. Nonfat, low-sodium ricotta or cottage cheese. Low-fat or nonfat yogurt. Low-fat, low-sodium cheese. Fats and  oils  Soft margarine without trans fats. Vegetable oil. Low-fat, reduced-fat, or light mayonnaise and salad dressings (reduced-sodium). Canola, safflower, olive, soybean, and sunflower oils. Avocado. Seasoning and other foods  Herbs. Spices. Seasoning mixes without salt. Unsalted popcorn and pretzels. Fat-free sweets. What foods are not recommended? The items listed may not be a complete list. Talk with your dietitian about what dietary choices are best for you. Grains  Baked goods made with fat, such as croissants, muffins, or some breads. Dry pasta or rice meal packs. Vegetables  Creamed or fried vegetables. Vegetables in a cheese sauce. Regular canned vegetables (not low-sodium or reduced-sodium). Regular canned tomato sauce and paste (not low-sodium or reduced-sodium). Regular tomato and vegetable juice (not low-sodium or reduced-sodium). Angie Fava. Olives. Fruits  Canned fruit in a light or heavy syrup. Fried fruit. Fruit in cream or butter sauce. Meat and other protein foods  Fatty cuts of meat. Ribs. Fried meat. Berniece Salines. Sausage. Bologna and other processed lunch meats. Salami. Fatback. Hotdogs. Bratwurst. Salted nuts and seeds. Canned beans with added salt. Canned or smoked fish. Whole eggs or egg  yolks. Chicken or [place] with skin. Dairy  Whole or 2% milk, cream, and half-and-half. Whole or full-fat cream cheese. Whole-fat or sweetened yogurt. Full-fat cheese. Nondairy creamers. Whipped toppings. Processed cheese and cheese spreads. Fats and oils  Butter. Stick margarine. Lard. Shortening. Ghee. Bacon fat. Tropical oils, such as coconut, palm kernel, or palm oil. Seasoning and other foods  Salted popcorn and pretzels. Onion salt, garlic salt, seasoned salt, table salt, and sea salt. Worcestershire sauce. Tartar sauce. Barbecue sauce. Teriyaki sauce. Soy sauce, including reduced-sodium. Steak sauce. Canned and packaged gravies. Fish sauce. Oyster sauce. Cocktail sauce. Horseradish that you find on the shelf. Ketchup. Mustard. Meat flavorings and tenderizers. Bouillon cubes. Hot sauce and Tabasco sauce. Premade or packaged marinades. Premade or packaged taco seasonings. Relishes. Regular salad dressings. Where to find more information:  National Heart, Lung, and [place]: https://wilson-eaton.com/  American Heart Association: www.heart.org Summary  The DASH eating plan is a healthy eating plan that has been shown to reduce high blood pressure (hypertension). It may also reduce your risk for type 2 diabetes, heart disease, and stroke.  With the DASH eating plan, you should limit salt (sodium) intake to 2,300 mg a day. If you have hypertension, you may need to reduce your sodium intake to 1,500 mg a day.  When on the DASH eating plan, aim to eat more fresh fruits and vegetables, whole grains, lean proteins, low-fat dairy, and heart-healthy fats.  Work with your health care provider or diet and nutrition specialist (dietitian) to adjust your eating plan to your individual calorie needs. This information is not intended to replace advice given to you by your health care provider. Make sure you discuss any questions you have with your health care provider. Document Released: 12/19/2010 Document  Revised: 12/24/2015 Document Reviewed: 12/24/2015 Elsevier Interactive Patient Education  2017 [employer].

## 2016-05-31 LAB — COMPREHENSIVE METABOLIC PANEL
A/G RATIO: 2.3 — AB (ref 1.2–2.2)
ALK PHOS: 77 IU/L (ref 39–117)
ALT: 78 IU/L — ABNORMAL HIGH (ref 0–32)
AST: 65 IU/L — AB (ref 0–40)
Albumin: 4.5 g/dL (ref 3.6–4.8)
BILIRUBIN TOTAL: 0.7 mg/dL (ref 0.0–1.2)
BUN / CREAT RATIO: 11 — AB (ref 12–28)
BUN: 9 mg/dL (ref 8–27)
CO2: 20 mmol/L (ref 18–29)
Calcium: 9.4 mg/dL (ref 8.7–10.3)
Chloride: 93 mmol/L — ABNORMAL LOW (ref 96–106)
Creatinine, Ser: 0.81 mg/dL (ref 0.57–1.00)
GFR, EST AFRICAN AMERICAN: 87 mL/min/{1.73_m2} (ref >59)
GFR, EST NON AFRICAN AMERICAN: 75 mL/min/{1.73_m2} (ref >59)
Globulin, Total: 2 g/dL (ref 1.5–4.5)
Glucose: 74 mg/dL (ref 65–99)
Potassium: 4.4 mmol/L (ref 3.5–5.2)
Sodium: 135 mmol/L (ref 134–144)
Total Protein: 6.5 g/dL (ref 6.0–8.5)

## 2016-05-31 LAB — CBC WITH DIFFERENTIAL/PLATELET
Basophils Absolute: 0 10*3/uL (ref 0.0–0.2)
Basos: 0 %
EOS (ABSOLUTE): 0.1 10*3/uL (ref 0.0–0.4)
Eos: 1 %
Hematocrit: 38.5 % (ref 34.0–46.6)
Hemoglobin: 12.7 g/dL (ref 11.1–15.9)
IMMATURE GRANULOCYTES: 1 %
Immature Grans (Abs): 0 10*3/uL (ref 0.0–0.1)
Lymphocytes Absolute: 2.2 10*3/uL (ref 0.7–3.1)
Lymphs: 41 %
MCH: 30.7 pg (ref 26.6–33.0)
MCHC: 33 g/dL (ref 31.5–35.7)
MCV: 93 fL (ref 79–97)
MONOS ABS: 0.6 10*3/uL (ref 0.1–0.9)
Monocytes: 11 %
NEUTROS ABS: 2.5 10*3/uL (ref 1.4–7.0)
NEUTROS PCT: 46 %
PLATELETS: 224 10*3/uL (ref 150–379)
RBC: 4.14 x10E6/uL (ref 3.77–5.28)
RDW: 13.5 % (ref 12.3–15.4)
WBC: 5.4 10*3/uL (ref 3.4–10.8)

## 2016-05-31 LAB — LIPID PANEL W/O CHOL/HDL RATIO
Cholesterol, Total: 272 mg/dL — ABNORMAL HIGH (ref 100–199)
HDL: 99 mg/dL (ref >39)
LDL CALC: 158 mg/dL — AB (ref 0–99)
Triglycerides: 77 mg/dL (ref 0–149)
VLDL CHOLESTEROL CAL: 15 mg/dL (ref 5–40)

## 2016-05-31 LAB — TSH: TSH: 1.37 u[IU]/mL (ref 0.450–4.500)

## 2016-09-02 ENCOUNTER — Encounter: Payer: Self-pay | Admitting: Family Medicine

## 2016-09-02 ENCOUNTER — Ambulatory Visit (INDEPENDENT_AMBULATORY_CARE_PROVIDER_SITE_OTHER): Payer: PPO | Admitting: Family Medicine

## 2016-09-02 VITALS — BP 108/70 | HR 87 | Temp 98.4°F | Wt 146.1 lb

## 2016-09-02 DIAGNOSIS — R030 Elevated blood-pressure reading, without diagnosis of hypertension: Secondary | ICD-10-CM | POA: Diagnosis not present

## 2016-09-02 NOTE — Progress Notes (Signed)
BP 108/70   Pulse 87   Temp 98.4 F (36.9 C)   Wt 146 lb 1 oz (66.3 kg)   SpO2 97%   BMI 27.06 kg/m    Subjective:    Patient ID: Kimberly Klein, female    DOB: 24-Sep-1950, 66 y.o.   MRN: [id number]  HPI: Kimberly Klein is a 66 y.o. female  Chief Complaint  Patient presents with  . Hypertension   HYPERTENSION Hypertension status: better  Satisfied with current treatment? yes Duration of hypertension: chronic BP monitoring frequency:  3x a week BP range: 130s/80 up to 140s/91 Previous BP meds: none Aspirin: no Recurrent headaches: no Visual changes: no Palpitations: no Dyspnea: no Chest pain: no Lower extremity edema: no Dizzy/lightheaded: no  Relevant past medical, surgical, family and social history reviewed and updated as indicated. Interim medical history since our last visit reviewed. Allergies and medications reviewed and updated.  Review of Systems  Constitutional: Negative.   Respiratory: Negative.   Cardiovascular: Negative.   Psychiatric/Behavioral: Negative.     Per HPI unless specifically indicated above     Objective:    BP 108/70   Pulse 87   Temp 98.4 F (36.9 C)   Wt 146 lb 1 oz (66.3 kg)   SpO2 97%   BMI 27.06 kg/m   Wt Readings from Last 3 Encounters:  09/02/16 146 lb 1 oz (66.3 kg)  05/30/16 161 lb (73 kg)  05/01/15 155 lb (70.3 kg)    Physical Exam  Constitutional: She is oriented to person, place, and time. She appears well-developed and well-nourished. No distress.  HENT:  Head: Normocephalic and atraumatic.  Right Ear: Hearing normal.  Left Ear: Hearing normal.  Nose: Nose normal.  Eyes: Conjunctivae and lids are normal. Right eye exhibits no discharge. Left eye exhibits no discharge. No scleral icterus.  Cardiovascular: Normal rate, regular rhythm, normal heart sounds and intact distal pulses.  Exam reveals no gallop and no friction rub.   No murmur heard. Pulmonary/Chest: Effort normal and breath sounds normal. No  respiratory distress. She has no wheezes. She has no rales. She exhibits no tenderness.  Musculoskeletal: Normal range of motion.  Neurological: She is alert and oriented to person, place, and time.  Skin: Skin is warm, dry and intact. No rash noted. She is not diaphoretic. No erythema.  Psychiatric: She has a normal mood and affect. Her speech is normal and behavior is normal. Judgment and thought content normal. Cognition and memory are normal.  Nursing note and vitals reviewed.   Results for orders placed or performed in visit on 05/30/16  Microscopic Examination  Result Value Ref Range   WBC, UA 0-5 0 - 5 /hpf   RBC, UA 0-2 0 - 2 /hpf   Epithelial Cells (non renal) 0-10 0 - 10 /hpf   Bacteria, UA None seen None seen/Few  Bayer DCA Hb A1c Waived  Result Value Ref Range   Bayer DCA Hb A1c Waived 4.9 <7.0 %  CBC with Differential/Platelet  Result Value Ref Range   WBC 5.4 3.4 - 10.8 x10E3/uL   RBC 4.14 3.77 - 5.28 x10E6/uL   Hemoglobin 12.7 11.1 - 15.9 g/dL   Hematocrit 38.5 34.0 - 46.6 %   MCV 93 79 - 97 fL   MCH 30.7 26.6 - 33.0 pg   MCHC 33.0 31.5 - 35.7 g/dL   RDW 13.5 12.3 - 15.4 %   Platelets 224 150 - 379 x10E3/uL   Neutrophils 46 Not Estab. %  Lymphs 41 Not Estab. %   Monocytes 11 Not Estab. %   Eos 1 Not Estab. %   Basos 0 Not Estab. %   Neutrophils Absolute 2.5 1.4 - 7.0 x10E3/uL   Lymphocytes Absolute 2.2 0.7 - 3.1 x10E3/uL   Monocytes Absolute 0.6 0.1 - 0.9 x10E3/uL   EOS (ABSOLUTE) 0.1 0.0 - 0.4 x10E3/uL   Basophils Absolute 0.0 0.0 - 0.2 x10E3/uL   Immature Granulocytes 1 Not Estab. %   Immature Grans (Abs) 0.0 0.0 - 0.1 x10E3/uL  Comprehensive metabolic panel  Result Value Ref Range   Glucose 74 65 - 99 mg/dL   BUN 9 8 - 27 mg/dL   Creatinine, Ser 0.81 0.57 - 1.00 mg/dL   GFR calc non Af Amer 75 >59 mL/min/1.73   GFR calc Af Amer 87 >59 mL/min/1.73   BUN/Creatinine Ratio 11 (L) 12 - 28   Sodium 135 134 - 144 mmol/L   Potassium 4.4 3.5 - 5.2 mmol/L    Chloride 93 (L) 96 - 106 mmol/L   CO2 20 18 - 29 mmol/L   Calcium 9.4 8.7 - 10.3 mg/dL   Total Protein 6.5 6.0 - 8.5 g/dL   Albumin 4.5 3.6 - 4.8 g/dL   Globulin, Total 2.0 1.5 - 4.5 g/dL   Albumin/Globulin Ratio 2.3 (H) 1.2 - 2.2   Bilirubin Total 0.7 0.0 - 1.2 mg/dL   Alkaline Phosphatase 77 39 - 117 IU/L   AST 65 (H) 0 - 40 IU/L   ALT 78 (H) 0 - 32 IU/L  Lipid Panel w/o Chol/HDL Ratio  Result Value Ref Range   Cholesterol, Total 272 (H) 100 - 199 mg/dL   Triglycerides 77 0 - 149 mg/dL   HDL 99 >39 mg/dL   VLDL Cholesterol Cal 15 5 - 40 mg/dL   LDL Calculated 158 (H) 0 - 99 mg/dL  Microalbumin, Urine Waived  Result Value Ref Range   Microalb, Ur Waived 10 0 - 19 mg/L   Creatinine, Urine Waived 50 10 - 300 mg/dL   Microalb/Creat Ratio <30 <30 mg/g  TSH  Result Value Ref Range   TSH 1.370 0.450 - 4.500 uIU/mL  UA/M w/rflx Culture, Routine  Result Value Ref Range   Specific Gravity, UA <1.005 (L) 1.005 - 1.030   pH, UA 5.0 5.0 - 7.5   Color, UA Yellow Yellow   Appearance Ur Clear Clear   Leukocytes, UA Negative Negative   Protein, UA Negative Negative/Trace   Glucose, UA Negative Negative   Ketones, UA 3+ (A) Negative   RBC, UA Negative Negative   Bilirubin, UA Negative Negative   Urobilinogen, Ur 0.2 0.2 - 1.0 mg/dL   Nitrite, UA Negative Negative   Microscopic Examination See below:       Assessment & Plan:   Problem List Items Addressed This Visit      Other   Elevated blood pressure reading in office with white coat syndrome, without diagnosis of hypertension - Primary    Much better on recheck. Congrats on 15lb weight loss! Continue DASH diet. Recheck May at physical.          Follow up plan: Return After May 18, for Wellness.

## 2016-09-02 NOTE — Assessment & Plan Note (Signed)
Much better on recheck. Congrats on 15lb weight loss! Continue DASH diet. Recheck May at physical.

## 2017-01-13 DIAGNOSIS — I10 Essential (primary) hypertension: Secondary | ICD-10-CM

## 2017-01-13 HISTORY — DX: Essential (primary) hypertension: I10

## 2017-02-09 DIAGNOSIS — H353132 Nonexudative age-related macular degeneration, bilateral, intermediate dry stage: Secondary | ICD-10-CM | POA: Diagnosis not present

## 2017-02-09 DIAGNOSIS — H25813 Combined forms of age-related cataract, bilateral: Secondary | ICD-10-CM | POA: Diagnosis not present

## 2017-02-10 ENCOUNTER — Encounter (INDEPENDENT_AMBULATORY_CARE_PROVIDER_SITE_OTHER): Payer: PPO | Admitting: Ophthalmology

## 2017-02-10 DIAGNOSIS — H2513 Age-related nuclear cataract, bilateral: Secondary | ICD-10-CM

## 2017-02-10 DIAGNOSIS — H43813 Vitreous degeneration, bilateral: Secondary | ICD-10-CM

## 2017-02-10 DIAGNOSIS — H353132 Nonexudative age-related macular degeneration, bilateral, intermediate dry stage: Secondary | ICD-10-CM | POA: Diagnosis not present

## 2017-05-01 ENCOUNTER — Telehealth: Payer: Self-pay | Admitting: Family Medicine

## 2017-05-01 NOTE — Telephone Encounter (Signed)
Copied from [place] [id number]. Topic: Medicare AWV >> May 01, 2017 10:04 AM Leo Rod wrote: Called to schedule Medicare Annual Wellness Visit with Nurse Health Advisor. If patient returns call please note: their last AWV was on 05/30/16 please schedule AWV with NHA any date after May 30 2017  Thank you! For any questions please contact: Jill Alexanders [phone]  Skype [email or web address]

## 2017-05-19 NOTE — Telephone Encounter (Signed)
Copied from [place] [id number]. Topic: Medicare AWV >> May 19, 2017  2:19 PM Leo Rod wrote: Called to schedule Medicare Annual Wellness Visit with Nurse Health Advisor. If patient returns call please note: their last AWV was on 5/ 18/18 please schedule AWV with NHA any date after May 30 2017  Thank you! For any questions please contact: Jill Alexanders [phone]  Skype [email or web address]

## 2017-06-09 ENCOUNTER — Encounter (INDEPENDENT_AMBULATORY_CARE_PROVIDER_SITE_OTHER): Payer: Self-pay

## 2018-02-10 ENCOUNTER — Encounter (INDEPENDENT_AMBULATORY_CARE_PROVIDER_SITE_OTHER): Payer: PPO | Admitting: Ophthalmology

## 2018-03-09 ENCOUNTER — Telehealth: Payer: Self-pay | Admitting: Family Medicine

## 2018-03-09 NOTE — Telephone Encounter (Unsigned)
Copied from [place] [id number]. Topic: Medicare AWV >> Mar 09, 2018  2:42 PM Sherren Kerns wrote: Called to see if she is still seeing [facility] as her Primary Care Provider.  If she is, please schedule Medicare Annual Wellness Visit with the Nurse Health Advisor. No answer, left message to call Lattie Haw at [phone].  If patient returns call, please note: their last AWV was on 05/30/2016,please schedule AWV-s with NHA any date AFTER 05/30/2017.  Thank you! For any questions please contact: Janace Hoard at [phone] or Skype [email or web address]

## 2018-03-11 ENCOUNTER — Encounter (INDEPENDENT_AMBULATORY_CARE_PROVIDER_SITE_OTHER): Payer: PPO | Admitting: Ophthalmology

## 2018-03-11 DIAGNOSIS — H353132 Nonexudative age-related macular degeneration, bilateral, intermediate dry stage: Secondary | ICD-10-CM | POA: Diagnosis not present

## 2018-03-11 DIAGNOSIS — H43813 Vitreous degeneration, bilateral: Secondary | ICD-10-CM | POA: Diagnosis not present

## 2018-08-11 ENCOUNTER — Other Ambulatory Visit: Payer: Self-pay

## 2018-09-27 ENCOUNTER — Encounter: Payer: Self-pay | Admitting: Family Medicine

## 2018-09-27 ENCOUNTER — Ambulatory Visit (INDEPENDENT_AMBULATORY_CARE_PROVIDER_SITE_OTHER): Payer: PPO | Admitting: Family Medicine

## 2018-09-27 ENCOUNTER — Other Ambulatory Visit: Payer: Self-pay

## 2018-09-27 ENCOUNTER — Ambulatory Visit: Payer: Self-pay | Admitting: *Deleted

## 2018-09-27 VITALS — BP 158/90 | HR 85 | Temp 98.4°F | Ht 62.0 in | Wt 172.0 lb

## 2018-09-27 DIAGNOSIS — R519 Headache, unspecified: Secondary | ICD-10-CM

## 2018-09-27 DIAGNOSIS — Z87891 Personal history of nicotine dependence: Secondary | ICD-10-CM

## 2018-09-27 DIAGNOSIS — R51 Headache: Secondary | ICD-10-CM

## 2018-09-27 DIAGNOSIS — Z23 Encounter for immunization: Secondary | ICD-10-CM | POA: Diagnosis not present

## 2018-09-27 DIAGNOSIS — I471 Supraventricular tachycardia, unspecified: Secondary | ICD-10-CM

## 2018-09-27 DIAGNOSIS — E669 Obesity, unspecified: Secondary | ICD-10-CM | POA: Diagnosis not present

## 2018-09-27 DIAGNOSIS — Z13220 Encounter for screening for lipoid disorders: Secondary | ICD-10-CM

## 2018-09-27 DIAGNOSIS — R030 Elevated blood-pressure reading, without diagnosis of hypertension: Secondary | ICD-10-CM

## 2018-09-27 DIAGNOSIS — K219 Gastro-esophageal reflux disease without esophagitis: Secondary | ICD-10-CM | POA: Diagnosis not present

## 2018-09-27 LAB — UA/M W/RFLX CULTURE, ROUTINE
Bilirubin, UA: NEGATIVE
Glucose, UA: NEGATIVE
Ketones, UA: NEGATIVE
Leukocytes,UA: NEGATIVE
Nitrite, UA: NEGATIVE
Protein,UA: NEGATIVE
RBC, UA: NEGATIVE
Specific Gravity, UA: 1.015 (ref 1.005–1.030)
Urobilinogen, Ur: 0.2 mg/dL (ref 0.2–1.0)
pH, UA: 5 (ref 5.0–7.5)

## 2018-09-27 LAB — MICROALBUMIN, URINE WAIVED
Creatinine, Urine Waived: 50 mg/dL (ref 10–300)
Microalb, Ur Waived: 10 mg/L (ref 0–19)
Microalb/Creat Ratio: <30 mg/g (ref <30)

## 2018-09-27 LAB — BAYER DCA HB A1C WAIVED: HB A1C (BAYER DCA - WAIVED): 5.3 % (ref <7.0)

## 2018-09-27 MED ORDER — LISINOPRIL 5 MG PO TABS: 5.0000 mg | ORAL_TABLET | Freq: Every day | ORAL | 3 refills | Status: DC

## 2018-09-27 NOTE — Telephone Encounter (Signed)
Pt called stating that she had a headache rated 6 out of 10 that started 09/23/2018; her BP was 172/102; her BP was 147/97 0900 on 09/27/2018; her headache has resolved after taking Excedrin PM on 09/25/2018; she took Excedrin PM again of  09/26/2018; the pt says that she she has taken other reading which were elevated' however she can not find the sheet that she wrote them on; she last saw Dr Wynetta Emery 2 years ago; recommendations made per nurse triage; the pt verbalized understanding, and would like to schedule an appointment for a physical and labs; the pt sees Dr Park Liter, [facility]; pt transferred to [facility] for scheduling; will route to office for notification.  Reason for Disposition . Systolic BP  >= 0000000 OR Diastolic >= [id number]  Answer Assessment - Initial Assessment Questions 1. BLOOD PRESSURE: "What is the blood pressure?" "Did you take at least two measurements 5 minutes apart?"     172/102 09/23/2018 or right brachial and 147/97 09/27/2018 right brachial  2. ONSET: "When did you take your blood pressure?" 09/23/2018 and 09/27/2018 3. HOW: "How did you obtain the blood pressure?" (e.g., visiting nurse, automatic home BP monitor)     Home cuff 4. HISTORY: "Do you have a history of high blood pressure?"  no 5. MEDICATIONS: "Are you taking any medications for blood pressure?" "Have you missed any doses recently?"  no 6. OTHER SYMPTOMS: "Do you have any symptoms?" (e.g., headache, chest pain, blurred vision, difficulty breathing, weakness) headache 7. PREGNANCY: "Is there any chance you are pregnant?" "When was your last menstrual period?"  no  Protocols used: HIGH BLOOD PRESSURE-A-AH

## 2018-09-28 ENCOUNTER — Encounter: Payer: Self-pay | Admitting: Family Medicine

## 2018-09-29 LAB — LYME AB/WESTERN BLOT REFLEX
LYME DISEASE AB, QUANT, IGM: <0.8 index (ref 0.00–0.79)
Lyme IgG/IgM Ab: <0.91 {ISR} (ref 0.00–0.90)

## 2018-09-29 LAB — LIPID PANEL W/O CHOL/HDL RATIO
Cholesterol, Total: 220 mg/dL — ABNORMAL HIGH (ref 100–199)
HDL: 70 mg/dL (ref >39)
LDL Chol Calc (NIH): 119 mg/dL — ABNORMAL HIGH (ref 0–99)
Triglycerides: 178 mg/dL — ABNORMAL HIGH (ref 0–149)
VLDL Cholesterol Cal: 31 mg/dL (ref 5–40)

## 2018-09-29 LAB — EHRLICHIA ANTIBODY PANEL
E. Chaffeensis (HME) IgM Titer: NEGATIVE
E.Chaffeensis (HME) IgG: NEGATIVE
HGE IgG Titer: NEGATIVE
HGE IgM Titer: NEGATIVE

## 2018-09-29 LAB — COMPREHENSIVE METABOLIC PANEL
ALT: 11 IU/L (ref 0–32)
AST: 18 IU/L (ref 0–40)
Albumin/Globulin Ratio: 1.7 (ref 1.2–2.2)
Albumin: 4.2 g/dL (ref 3.8–4.8)
Alkaline Phosphatase: 88 IU/L (ref 39–117)
BUN/Creatinine Ratio: 17 (ref 12–28)
BUN: 15 mg/dL (ref 8–27)
Bilirubin Total: 0.3 mg/dL (ref 0.0–1.2)
CO2: 23 mmol/L (ref 20–29)
Calcium: 9.4 mg/dL (ref 8.7–10.3)
Chloride: 100 mmol/L (ref 96–106)
Creatinine, Ser: 0.9 mg/dL (ref 0.57–1.00)
GFR calc Af Amer: 75 mL/min/{1.73_m2} (ref >59)
GFR calc non Af Amer: 65 mL/min/{1.73_m2} (ref >59)
Globulin, Total: 2.5 g/dL (ref 1.5–4.5)
Glucose: 128 mg/dL — ABNORMAL HIGH (ref 65–99)
Potassium: 4.2 mmol/L (ref 3.5–5.2)
Sodium: 139 mmol/L (ref 134–144)
Total Protein: 6.7 g/dL (ref 6.0–8.5)

## 2018-09-29 LAB — CBC WITH DIFFERENTIAL/PLATELET
Basophils Absolute: 0 10*3/uL (ref 0.0–0.2)
Basos: 1 %
EOS (ABSOLUTE): 0.1 10*3/uL (ref 0.0–0.4)
Eos: 1 %
Hematocrit: 42 % (ref 34.0–46.6)
Hemoglobin: 14 g/dL (ref 11.1–15.9)
Immature Grans (Abs): 0 10*3/uL (ref 0.0–0.1)
Immature Granulocytes: 1 %
Lymphocytes Absolute: 2 10*3/uL (ref 0.7–3.1)
Lymphs: 33 %
MCH: 31.5 pg (ref 26.6–33.0)
MCHC: 33.3 g/dL (ref 31.5–35.7)
MCV: 95 fL (ref 79–97)
Monocytes Absolute: 0.6 10*3/uL (ref 0.1–0.9)
Monocytes: 10 %
Neutrophils Absolute: 3.4 10*3/uL (ref 1.4–7.0)
Neutrophils: 54 %
Platelets: 233 10*3/uL (ref 150–450)
RBC: 4.44 x10E6/uL (ref 3.77–5.28)
RDW: 11.6 % — ABNORMAL LOW (ref 11.7–15.4)
WBC: 6.2 10*3/uL (ref 3.4–10.8)

## 2018-09-29 LAB — ROCKY MTN SPOTTED FVR ABS PNL(IGG+IGM)
RMSF IgG: NEGATIVE
RMSF IgM: 0.7 index (ref 0.00–0.89)

## 2018-09-29 LAB — TSH: TSH: 1.21 u[IU]/mL (ref 0.450–4.500)

## 2018-09-29 LAB — BABESIA MICROTI ANTIBODY PANEL
Babesia microti IgG: <1:10 {titer}
Babesia microti IgM: <1:10 {titer}

## 2018-10-01 ENCOUNTER — Encounter: Payer: Self-pay | Admitting: Family Medicine

## 2018-10-03 NOTE — Progress Notes (Signed)
BP (!) 158/90 (BP Location: Left Arm, Cuff Size: Normal)   Pulse 85   Temp 98.4 F (36.9 C) (Oral)   Ht 5\' 2"  (1.575 m)   Wt 172 lb (78 kg)   SpO2 99%   BMI 31.46 kg/m    Subjective:    Patient ID: Kimberly Klein, female    DOB: 07-Feb-1950, 68 y.o.   MRN: [id number]  HPI: Kimberly Klein is a 68 y.o. female  Chief Complaint  Patient presents with  . Hypertension  . Headache    since last Thursday   Started with a headache 5 days ago. Had a severe headache. Started with some advil. Pain woke her up at night. BP was running high when she got to work the next day- running in the 200s/100s, headache continued all day long. Saturday morning she took some excedrine and it went away, but started to have the headaches come back. No changes in her vision. Pain over the R side of her forehead and over the R side of her face. Has been better since the excedrin on Saturday.  ELEVATED BLOOD PRESSURE Duration of elevated BP: unknown BP monitoring frequency: not checking BP range: 170s/100s Previous BP meds: no Recent stressors: no Family history of hypertension: yes Recurrent headaches: yes Visual changes: no Palpitations: no  Dyspnea: no Chest pain: no Lower extremity edema: no Dizzy/lightheaded: no Transient ischemic attacks: no  Relevant past medical, surgical, family and social history reviewed and updated as indicated. Interim medical history since our last visit reviewed. Allergies and medications reviewed and updated.  Review of Systems  Constitutional: Negative.   HENT: Negative.   Respiratory: Negative.   Cardiovascular: Negative.   Musculoskeletal: Negative.   Neurological: Positive for headaches. Negative for dizziness, tremors, seizures, syncope, facial asymmetry, speech difficulty, weakness, light-headedness and numbness.  Psychiatric/Behavioral: Negative.     Per HPI unless specifically indicated above     Objective:    BP (!) 158/90 (BP Location: Left  Arm, Cuff Size: Normal)   Pulse 85   Temp 98.4 F (36.9 C) (Oral)   Ht 5\' 2"  (1.575 m)   Wt 172 lb (78 kg)   SpO2 99%   BMI 31.46 kg/m   Wt Readings from Last 3 Encounters:  09/27/18 172 lb (78 kg)  09/02/16 146 lb 1 oz (66.3 kg)  05/30/16 161 lb (73 kg)    Physical Exam Vitals signs and nursing note reviewed.  Constitutional:      General: She is not in acute distress.    Appearance: Normal appearance. She is not ill-appearing, toxic-appearing or diaphoretic.  HENT:     Head: Normocephalic and atraumatic.     Right Ear: External ear normal.     Left Ear: External ear normal.     Nose: Nose normal.     Mouth/Throat:     Mouth: Mucous membranes are moist.     Pharynx: Oropharynx is clear.  Eyes:     General: No scleral icterus.       Right eye: No discharge.        Left eye: No discharge.     Extraocular Movements: Extraocular movements intact.     Conjunctiva/sclera: Conjunctivae normal.     Pupils: Pupils are equal, round, and reactive to light.  Neck:     Musculoskeletal: Normal range of motion and neck supple.  Cardiovascular:     Rate and Rhythm: Normal rate and regular rhythm.     Pulses: Normal pulses.  Heart sounds: Normal heart sounds. No murmur. No friction rub. No gallop.   Pulmonary:     Effort: Pulmonary effort is normal. No respiratory distress.     Breath sounds: Normal breath sounds. No stridor. No wheezing, rhonchi or rales.  Chest:     Chest wall: No tenderness.  Musculoskeletal: Normal range of motion.  Skin:    General: Skin is warm and dry.     Capillary Refill: Capillary refill takes less than 2 seconds.     Coloration: Skin is not jaundiced or pale.     Findings: No bruising, erythema, lesion or rash.  Neurological:     General: No focal deficit present.     Mental Status: She is alert and oriented to person, place, and time. Mental status is at baseline.  Psychiatric:        Mood and Affect: Mood normal.        Behavior: Behavior  normal.        Thought Content: Thought content normal.        Judgment: Judgment normal.     Results for orders placed or performed in visit on 09/27/18  Bayer DCA Hb A1c Waived  Result Value Ref Range   HB A1C (BAYER DCA - WAIVED) 5.3 <7.0 %  CBC with Differential/Platelet  Result Value Ref Range   WBC 6.2 3.4 - 10.8 x10E3/uL   RBC 4.44 3.77 - 5.28 x10E6/uL   Hemoglobin 14.0 11.1 - 15.9 g/dL   Hematocrit 42.0 34.0 - 46.6 %   MCV 95 79 - 97 fL   MCH 31.5 26.6 - 33.0 pg   MCHC 33.3 31.5 - 35.7 g/dL   RDW 11.6 (L) 11.7 - 15.4 %   Platelets 233 150 - 450 x10E3/uL   Neutrophils 54 Not Estab. %   Lymphs 33 Not Estab. %   Monocytes 10 Not Estab. %   Eos 1 Not Estab. %   Basos 1 Not Estab. %   Neutrophils Absolute 3.4 1.4 - 7.0 x10E3/uL   Lymphocytes Absolute 2.0 0.7 - 3.1 x10E3/uL   Monocytes Absolute 0.6 0.1 - 0.9 x10E3/uL   EOS (ABSOLUTE) 0.1 0.0 - 0.4 x10E3/uL   Basophils Absolute 0.0 0.0 - 0.2 x10E3/uL   Immature Granulocytes 1 Not Estab. %   Immature Grans (Abs) 0.0 0.0 - 0.1 x10E3/uL  Comprehensive metabolic panel  Result Value Ref Range   Glucose 128 (H) 65 - 99 mg/dL   BUN 15 8 - 27 mg/dL   Creatinine, Ser 0.90 0.57 - 1.00 mg/dL   GFR calc non Af Amer 65 >59 mL/min/1.73   GFR calc Af Amer 75 >59 mL/min/1.73   BUN/Creatinine Ratio 17 12 - 28   Sodium 139 134 - 144 mmol/L   Potassium 4.2 3.5 - 5.2 mmol/L   Chloride 100 96 - 106 mmol/L   CO2 23 20 - 29 mmol/L   Calcium 9.4 8.7 - 10.3 mg/dL   Total Protein 6.7 6.0 - 8.5 g/dL   Albumin 4.2 3.8 - 4.8 g/dL   Globulin, Total 2.5 1.5 - 4.5 g/dL   Albumin/Globulin Ratio 1.7 1.2 - 2.2   Bilirubin Total 0.3 0.0 - 1.2 mg/dL   Alkaline Phosphatase 88 39 - 117 IU/L   AST 18 0 - 40 IU/L   ALT 11 0 - 32 IU/L  Lipid Panel w/o Chol/HDL Ratio  Result Value Ref Range   Cholesterol, Total 220 (H) 100 - 199 mg/dL   Triglycerides 178 (H) 0 - 149 mg/dL  HDL 70 >39 mg/dL   VLDL Cholesterol Cal 31 5 - 40 mg/dL   LDL Chol Calc  (NIH) 119 (H) 0 - 99 mg/dL  Microalbumin, Urine Waived  Result Value Ref Range   Microalb, Ur Waived 10 0 - 19 mg/L   Creatinine, Urine Waived 50 10 - 300 mg/dL   Microalb/Creat Ratio <30 <30 mg/g  TSH  Result Value Ref Range   TSH 1.210 0.450 - 4.500 uIU/mL  UA/M w/rflx Culture, Routine   Specimen: Blood   BLD  Result Value Ref Range   Specific Gravity, UA 1.015 1.005 - 1.030   pH, UA 5.0 5.0 - 7.5   Color, UA Yellow Yellow   Appearance Ur Clear Clear   Leukocytes,UA Negative Negative   Protein,UA Negative Negative/Trace   Glucose, UA Negative Negative   Ketones, UA Negative Negative   RBC, UA Negative Negative   Bilirubin, UA Negative Negative   Urobilinogen, Ur 0.2 0.2 - 1.0 mg/dL   Nitrite, UA Negative Negative  Lyme Ab/Western Blot Reflex  Result Value Ref Range   Lyme IgG/IgM Ab <0.91 0.00 - 0.90 ISR   LYME DISEASE AB, QUANT, IGM <0.80 0.00 - 0.79 index  Rocky mtn spotted fvr abs pnl(IgG+IgM)  Result Value Ref Range   RMSF IgG Negative Negative   RMSF IgM 0.70 0.00 - 0.89 index  Babesia microti Antibody Panel  Result Value Ref Range   Babesia microti IgM <1:10 Neg:<1:10   Babesia microti IgG [id number]  Ehrlichia Antibody Panel  Result Value Ref Range   E.Chaffeensis (HME) IgG Negative Neg:<1:64   E. Chaffeensis (HME) IgM Titer Negative Neg:<1:20   HGE IgG Titer Negative Neg:<1:64   HGE IgM Titer Negative Neg:<1:20      Assessment & Plan:   Problem List Items Addressed This Visit      Cardiovascular and Mediastinum   Paroxysmal supraventricular tachycardia (HCC)    Under good control with HR of 85 today. Continue current regimen. Continue to monitor. Call with any concerns.       Relevant Medications   lisinopril (ZESTRIL) 5 MG tablet   Other Relevant Orders   TSH (Completed)     Digestive   GERD (gastroesophageal reflux disease)    Stable. Checking labs today. Await results.       Relevant Orders   CBC with Differential/Platelet (Completed)    TSH (Completed)     Other   History of tobacco abuse    Checking labs today. Await results. Call with any concerns.       Relevant Orders   CBC with Differential/Platelet (Completed)   UA/M w/rflx Culture, Routine (Completed)   Elevated blood pressure reading in office with white coat syndrome, without diagnosis of hypertension    Will start low dose lisinopril and recheck 1 month. Call with any concerns.       Relevant Orders   CBC with Differential/Platelet (Completed)   Comprehensive metabolic panel (Completed)   Microalbumin, Urine Waived (Completed)   TSH (Completed)    Other Visit Diagnoses    Acute nonintractable headache, unspecified headache type    -  Primary   Will check labs to see if any tick diseases. Will start low dose lisinopril. Recheck 1 month. Call with any concerns.    Relevant Orders   Lyme Ab/Western Blot Reflex (Completed)   Rocky mtn spotted fvr abs pnl(IgG+IgM) (Completed)   Babesia microti Antibody Panel (Completed)   Ehrlichia Antibody Panel (Completed)   Screening for cholesterol level  Labs drawn today. Await results.    Relevant Orders   Lipid Panel w/o Chol/HDL Ratio (Completed)   Obesity (BMI 30-39.9)       Encourage patient to lose 1-2lbs a week. Work on diet and exercise.    Relevant Orders   Bayer DCA Hb A1c Waived (Completed)   Need for pneumococcal vaccine       Pneumonia shot given today.   Relevant Orders   Pneumococcal polysaccharide vaccine 23-valent greater than or equal to 2yo subcutaneous/IM (Completed)       Follow up plan: Return in about 4 weeks (around 10/25/2018) for follow up BP.

## 2018-10-03 NOTE — Assessment & Plan Note (Signed)
Stable. Checking labs today. Await results.  

## 2018-10-03 NOTE — Assessment & Plan Note (Signed)
Will start low dose lisinopril and recheck 1 month. Call with any concerns.

## 2018-10-03 NOTE — Assessment & Plan Note (Signed)
Under good control with HR of 85 today. Continue current regimen. Continue to monitor. Call with any concerns.

## 2018-10-03 NOTE — Assessment & Plan Note (Signed)
Checking labs today. Await results. Call with any concerns.  

## 2018-10-04 ENCOUNTER — Other Ambulatory Visit: Payer: Self-pay | Admitting: Family Medicine

## 2018-10-04 ENCOUNTER — Encounter: Payer: Self-pay | Admitting: Family Medicine

## 2018-10-04 MED ORDER — LISINOPRIL 10 MG PO TABS: 10.0000 mg | ORAL_TABLET | Freq: Every day | ORAL | 3 refills | Status: DC

## 2018-10-06 ENCOUNTER — Encounter: Payer: Self-pay | Admitting: Family Medicine

## 2018-10-22 ENCOUNTER — Encounter: Payer: PPO | Admitting: Family Medicine

## 2018-12-08 ENCOUNTER — Other Ambulatory Visit: Payer: Self-pay

## 2019-03-15 ENCOUNTER — Encounter (INDEPENDENT_AMBULATORY_CARE_PROVIDER_SITE_OTHER): Payer: PPO | Admitting: Ophthalmology

## 2019-03-21 ENCOUNTER — Other Ambulatory Visit: Payer: Self-pay | Admitting: Family Medicine

## 2019-03-21 NOTE — Telephone Encounter (Signed)
Requested Prescriptions  Pending Prescriptions Disp Refills  . lisinopril (ZESTRIL) 10 MG tablet [Pharmacy Med Name: LISINOPRIL 10MG  TABLETS] 30 tablet 3    Sig: TAKE 1 TABLET(10 MG) BY MOUTH DAILY     Cardiovascular:  ACE Inhibitors Failed - 03/21/2019  3:46 AM      Failed - Last BP in normal range    BP Readings from Last 1 Encounters:  09/27/18 (!) 158/90         Passed - Cr in normal range and within 180 days    Creatinine  Date Value Ref Range Status  07/18/2013 0.94 0.60 - 1.30 mg/dL Final   Creatinine, Ser  Date Value Ref Range Status  09/27/2018 0.90 0.57 - 1.00 mg/dL Final         Passed - K in normal range and within 180 days    Potassium  Date Value Ref Range Status  09/27/2018 4.2 3.5 - 5.2 mmol/L Final  07/18/2013 4.1 3.5 - 5.1 mmol/L Final         Passed - Patient is not pregnant      Passed - Valid encounter within last 6 months    Recent Outpatient Visits          5 months ago Acute nonintractable headache, unspecified headache type   [facility], Megan P, DO   2 years ago Elevated blood pressure reading in office with white coat syndrome, without diagnosis of hypertension   [place], Gerald, DO   2 years ago Medicare annual wellness visit, subsequent   [employer], [place], DO   4 years ago Right-sided thoracic back pain   [employer], [place], DO

## 2019-04-04 ENCOUNTER — Ambulatory Visit (INDEPENDENT_AMBULATORY_CARE_PROVIDER_SITE_OTHER): Payer: PPO | Admitting: Family Medicine

## 2019-04-04 ENCOUNTER — Other Ambulatory Visit: Payer: Self-pay

## 2019-04-04 ENCOUNTER — Encounter: Payer: Self-pay | Admitting: Family Medicine

## 2019-04-04 VITALS — BP 166/101 | HR 82 | Temp 97.7°F | Wt 174.0 lb

## 2019-04-04 DIAGNOSIS — F419 Anxiety disorder, unspecified: Secondary | ICD-10-CM | POA: Diagnosis not present

## 2019-04-04 DIAGNOSIS — I10 Essential (primary) hypertension: Secondary | ICD-10-CM | POA: Diagnosis not present

## 2019-04-04 DIAGNOSIS — Z1159 Encounter for screening for other viral diseases: Secondary | ICD-10-CM

## 2019-04-04 DIAGNOSIS — Z13220 Encounter for screening for lipoid disorders: Secondary | ICD-10-CM | POA: Diagnosis not present

## 2019-04-04 LAB — MICROALBUMIN, URINE WAIVED
Creatinine, Urine Waived: 10 mg/dL (ref 10–300)
Microalb, Ur Waived: 10 mg/L (ref 0–19)
Microalb/Creat Ratio: <30 mg/g (ref <30)

## 2019-04-04 MED ORDER — AMLODIPINE BESYLATE 5 MG PO TABS: 5.0000 mg | ORAL_TABLET | Freq: Every day | ORAL | 1 refills | Status: DC

## 2019-04-04 MED ORDER — LISINOPRIL 20 MG PO TABS: 20.0000 mg | ORAL_TABLET | Freq: Every day | ORAL | 1 refills | Status: DC

## 2019-04-04 MED ORDER — ALPRAZOLAM 0.5 MG PO TABS: 0.5000 mg | ORAL_TABLET | Freq: Three times a day (TID) | ORAL | 0 refills | Status: DC | PRN

## 2019-04-04 NOTE — Assessment & Plan Note (Signed)
Not under good control. Will increase lisinopril to 20mg  and add amlodipine. Recheck 1 month. Call with any concerns. Labs drawn today.

## 2019-04-04 NOTE — Progress Notes (Signed)
BP (!) 166/101   Pulse 82   Temp 97.7 F (36.5 C) (Oral)   Wt 174 lb (78.9 kg)   SpO2 95%   BMI 31.83 kg/m    Subjective:    Patient ID: Kimberly Klein, female    DOB: 11/19/1950, 69 y.o.   MRN: [id number]  HPI: Kimberly Klein is a 69 y.o. female who presents today after being lost to follow up for 6 months.   Chief Complaint  Patient presents with  . Hypertension    pt states she had been taking 20 mg lisinopril since last Saturday   HYPERTENSION- has been taking 20mg  of lisinopril for the past 2 days Hypertension status: uncontrolled  Satisfied with current treatment? no Duration of hypertension: chronic BP monitoring frequency:  a few times a week BP range: 170s/90s-110s BP medication side effects:  no Medication compliance: excellent compliance Previous BP meds: lisinopril Aspirin: no Recurrent headaches: no Visual changes: no Palpitations: no Dyspnea: no Chest pain: no Lower extremity edema: no Dizzy/lightheaded: no  To have dental surgery shortly. Very anxious. Would like something to help her get through it.   Relevant past medical, surgical, family and social history reviewed and updated as indicated. Interim medical history since our last visit reviewed. Allergies and medications reviewed and updated.  Review of Systems  Constitutional: Negative.   Respiratory: Negative.   Cardiovascular: Negative.   Gastrointestinal: Negative.   Musculoskeletal: Negative.   Neurological: Negative.   Psychiatric/Behavioral: Negative.     Per HPI unless specifically indicated above     Objective:    BP (!) 166/101   Pulse 82   Temp 97.7 F (36.5 C) (Oral)   Wt 174 lb (78.9 kg)   SpO2 95%   BMI 31.83 kg/m   Wt Readings from Last 3 Encounters:  04/04/19 174 lb (78.9 kg)  09/27/18 172 lb (78 kg)  09/02/16 146 lb 1 oz (66.3 kg)    Physical Exam Vitals and nursing note reviewed.  Constitutional:      General: She is not in acute distress.  Appearance: Normal appearance. She is not ill-appearing, toxic-appearing or diaphoretic.  HENT:     Head: Normocephalic and atraumatic.     Right Ear: External ear normal.     Left Ear: External ear normal.     Nose: Nose normal.     Mouth/Throat:     Mouth: Mucous membranes are moist.     Pharynx: Oropharynx is clear.  Eyes:     General: No scleral icterus.       Right eye: No discharge.        Left eye: No discharge.     Extraocular Movements: Extraocular movements intact.     Conjunctiva/sclera: Conjunctivae normal.     Pupils: Pupils are equal, round, and reactive to light.  Cardiovascular:     Rate and Rhythm: Normal rate and regular rhythm.     Pulses: Normal pulses.     Heart sounds: Normal heart sounds. No murmur. No friction rub. No gallop.   Pulmonary:     Effort: Pulmonary effort is normal. No respiratory distress.     Breath sounds: Normal breath sounds. No stridor. No wheezing, rhonchi or rales.  Chest:     Chest wall: No tenderness.  Musculoskeletal:        General: Normal range of motion.     Cervical back: Normal range of motion and neck supple.  Skin:    General: Skin is warm and dry.  Capillary Refill: Capillary refill takes less than 2 seconds.     Coloration: Skin is not jaundiced or pale.     Findings: No bruising, erythema, lesion or rash.  Neurological:     General: No focal deficit present.     Mental Status: She is alert and oriented to person, place, and time. Mental status is at baseline.  Psychiatric:        Mood and Affect: Mood normal.        Behavior: Behavior normal.        Thought Content: Thought content normal.        Judgment: Judgment normal.     Results for orders placed or performed in visit on 09/27/18  Bayer DCA Hb A1c Waived  Result Value Ref Range   HB A1C (BAYER DCA - WAIVED) 5.3 <7.0 %  CBC with Differential/Platelet  Result Value Ref Range   WBC 6.2 3.4 - 10.8 x10E3/uL   RBC 4.44 3.77 - 5.28 x10E6/uL   Hemoglobin  14.0 11.1 - 15.9 g/dL   Hematocrit 42.0 34.0 - 46.6 %   MCV 95 79 - 97 fL   MCH 31.5 26.6 - 33.0 pg   MCHC 33.3 31.5 - 35.7 g/dL   RDW 11.6 (L) 11.7 - 15.4 %   Platelets 233 150 - 450 x10E3/uL   Neutrophils 54 Not Estab. %   Lymphs 33 Not Estab. %   Monocytes 10 Not Estab. %   Eos 1 Not Estab. %   Basos 1 Not Estab. %   Neutrophils Absolute 3.4 1.4 - 7.0 x10E3/uL   Lymphocytes Absolute 2.0 0.7 - 3.1 x10E3/uL   Monocytes Absolute 0.6 0.1 - 0.9 x10E3/uL   EOS (ABSOLUTE) 0.1 0.0 - 0.4 x10E3/uL   Basophils Absolute 0.0 0.0 - 0.2 x10E3/uL   Immature Granulocytes 1 Not Estab. %   Immature Grans (Abs) 0.0 0.0 - 0.1 x10E3/uL  Comprehensive metabolic panel  Result Value Ref Range   Glucose 128 (H) 65 - 99 mg/dL   BUN 15 8 - 27 mg/dL   Creatinine, Ser 0.90 0.57 - 1.00 mg/dL   GFR calc non Af Amer 65 >59 mL/min/1.73   GFR calc Af Amer 75 >59 mL/min/1.73   BUN/Creatinine Ratio 17 12 - 28   Sodium 139 134 - 144 mmol/L   Potassium 4.2 3.5 - 5.2 mmol/L   Chloride 100 96 - 106 mmol/L   CO2 23 20 - 29 mmol/L   Calcium 9.4 8.7 - 10.3 mg/dL   Total Protein 6.7 6.0 - 8.5 g/dL   Albumin 4.2 3.8 - 4.8 g/dL   Globulin, Total 2.5 1.5 - 4.5 g/dL   Albumin/Globulin Ratio 1.7 1.2 - 2.2   Bilirubin Total 0.3 0.0 - 1.2 mg/dL   Alkaline Phosphatase 88 39 - 117 IU/L   AST 18 0 - 40 IU/L   ALT 11 0 - 32 IU/L  Lipid Panel w/o Chol/HDL Ratio  Result Value Ref Range   Cholesterol, Total 220 (H) 100 - 199 mg/dL   Triglycerides 178 (H) 0 - 149 mg/dL   HDL 70 >39 mg/dL   VLDL Cholesterol Cal 31 5 - 40 mg/dL   LDL Chol Calc (NIH) 119 (H) 0 - 99 mg/dL  Microalbumin, Urine Waived  Result Value Ref Range   Microalb, Ur Waived 10 0 - 19 mg/L   Creatinine, Urine Waived 50 10 - 300 mg/dL   Microalb/Creat Ratio <30 <30 mg/g  TSH  Result Value Ref Range   TSH  1.210 0.450 - 4.500 uIU/mL  UA/M w/rflx Culture, Routine   Specimen: Blood   BLD  Result Value Ref Range   Specific Gravity, UA 1.015 1.005 - 1.030    pH, UA 5.0 5.0 - 7.5   Color, UA Yellow Yellow   Appearance Ur Clear Clear   Leukocytes,UA Negative Negative   Protein,UA Negative Negative/Trace   Glucose, UA Negative Negative   Ketones, UA Negative Negative   RBC, UA Negative Negative   Bilirubin, UA Negative Negative   Urobilinogen, Ur 0.2 0.2 - 1.0 mg/dL   Nitrite, UA Negative Negative  Lyme Ab/Western Blot Reflex  Result Value Ref Range   Lyme IgG/IgM Ab <0.91 0.00 - 0.90 ISR   LYME DISEASE AB, QUANT, IGM <0.80 0.00 - 0.79 index  Rocky mtn spotted fvr abs pnl(IgG+IgM)  Result Value Ref Range   RMSF IgG Negative Negative   RMSF IgM 0.70 0.00 - 0.89 index  Babesia microti Antibody Panel  Result Value Ref Range   Babesia microti IgM <1:10 Neg:<1:10   Babesia microti IgG [id number]  Ehrlichia Antibody Panel  Result Value Ref Range   E.Chaffeensis (HME) IgG Negative Neg:<1:64   E. Chaffeensis (HME) IgM Titer Negative Neg:<1:20   HGE IgG Titer Negative Neg:<1:64   HGE IgM Titer Negative Neg:<1:20      Assessment & Plan:   Problem List Items Addressed This Visit      Cardiovascular and Mediastinum   HTN (hypertension) - Primary    Not under good control. Will increase lisinopril to 20mg  and add amlodipine. Recheck 1 month. Call with any concerns. Labs drawn today.      Relevant Medications   lisinopril (ZESTRIL) 20 MG tablet   amLODipine (NORVASC) 5 MG tablet   Other Relevant Orders   Comprehensive metabolic panel   Microalbumin, Urine Waived    Other Visit Diagnoses    Screening for cholesterol level       Labs drawn today. Await results.    Relevant Orders   Comprehensive metabolic panel   Lipid Panel w/o Chol/HDL Ratio   Encounter for hepatitis C screening test for low risk patient       Labs drawn today. Await results.    Relevant Orders   Hepatitis C Antibody   Acute anxiety       Will give some xanax to make it through her dental surgery. Call with any concerns.    Relevant Medications    ALPRAZolam (XANAX) 0.5 MG tablet       Follow up plan: Return in about 4 weeks (around 05/02/2019) for follow up BP.

## 2019-04-05 LAB — COMPREHENSIVE METABOLIC PANEL
ALT: 14 IU/L (ref 0–32)
AST: 21 IU/L (ref 0–40)
Albumin/Globulin Ratio: 1.6 (ref 1.2–2.2)
Albumin: 4.3 g/dL (ref 3.8–4.8)
Alkaline Phosphatase: 89 IU/L (ref 39–117)
BUN/Creatinine Ratio: 18 (ref 12–28)
BUN: 15 mg/dL (ref 8–27)
Bilirubin Total: 0.3 mg/dL (ref 0.0–1.2)
CO2: 23 mmol/L (ref 20–29)
Calcium: 9.9 mg/dL (ref 8.7–10.3)
Chloride: 100 mmol/L (ref 96–106)
Creatinine, Ser: 0.82 mg/dL (ref 0.57–1.00)
GFR calc Af Amer: 84 mL/min/{1.73_m2} (ref >59)
GFR calc non Af Amer: 73 mL/min/{1.73_m2} (ref >59)
Globulin, Total: 2.7 g/dL (ref 1.5–4.5)
Glucose: 96 mg/dL (ref 65–99)
Potassium: 4.4 mmol/L (ref 3.5–5.2)
Sodium: 140 mmol/L (ref 134–144)
Total Protein: 7 g/dL (ref 6.0–8.5)

## 2019-04-05 LAB — LIPID PANEL W/O CHOL/HDL RATIO
Cholesterol, Total: 241 mg/dL — ABNORMAL HIGH (ref 100–199)
HDL: 89 mg/dL (ref >39)
LDL Chol Calc (NIH): 130 mg/dL — ABNORMAL HIGH (ref 0–99)
Triglycerides: 130 mg/dL (ref 0–149)
VLDL Cholesterol Cal: 22 mg/dL (ref 5–40)

## 2019-04-05 LAB — HEPATITIS C ANTIBODY: Hep C Virus Ab: <0.1 s/co ratio (ref 0.0–0.9)

## 2019-04-18 ENCOUNTER — Encounter (INDEPENDENT_AMBULATORY_CARE_PROVIDER_SITE_OTHER): Payer: PPO | Admitting: Ophthalmology

## 2019-04-18 ENCOUNTER — Other Ambulatory Visit: Payer: Self-pay

## 2019-04-18 DIAGNOSIS — H353132 Nonexudative age-related macular degeneration, bilateral, intermediate dry stage: Secondary | ICD-10-CM

## 2019-04-18 DIAGNOSIS — H43813 Vitreous degeneration, bilateral: Secondary | ICD-10-CM | POA: Diagnosis not present

## 2019-04-18 DIAGNOSIS — H2513 Age-related nuclear cataract, bilateral: Secondary | ICD-10-CM

## 2019-04-21 ENCOUNTER — Ambulatory Visit: Payer: PPO | Admitting: Family Medicine

## 2019-04-27 ENCOUNTER — Ambulatory Visit (INDEPENDENT_AMBULATORY_CARE_PROVIDER_SITE_OTHER): Payer: PPO

## 2019-04-27 VITALS — BP 138/80 | Ht 61.0 in | Wt 160.0 lb

## 2019-04-27 DIAGNOSIS — Z0000 Encounter for general adult medical examination without abnormal findings: Secondary | ICD-10-CM | POA: Diagnosis not present

## 2019-04-27 DIAGNOSIS — Z1231 Encounter for screening mammogram for malignant neoplasm of breast: Secondary | ICD-10-CM | POA: Diagnosis not present

## 2019-04-27 NOTE — Patient Instructions (Addendum)
Kimberly Klein , Thank you for taking time to come for your Medicare Wellness Visit. I appreciate your ongoing commitment to your health goals. Please review the following plan we discussed and let me know if I can assist you in the future.   Screening recommendations/referrals: Colonoscopy: up to date  Mammogram: Please call [phone] to schedule your mammogram. [facility])  Bone Density: up to date Recommended yearly ophthalmology/optometry visit for glaucoma screening and checkup Recommended yearly dental visit for hygiene and checkup  Vaccinations: Influenza vaccine: up to date  Pneumococcal vaccine: up to date  Tdap vaccine: due now  Shingles vaccine: shingrix eligible    Covid-19: completed   Advanced directives: Please bring a copy of your health care power of attorney and living will to the office at your convenience.  Conditions/risks identified: Hypertension: Keep checking and logging blood pressure readings daily.   Next appointment: Follow up in one year for your annual wellness visit    Preventive Care 65 Years and Older, Female Preventive care refers to lifestyle choices and visits with your health care provider that can promote health and wellness. What does preventive care include?  A yearly physical exam. This is also called an annual well check.  Dental exams once or twice a year.  Routine eye exams. Ask your health care provider how often you should have your eyes checked.  Personal lifestyle choices, including:  Daily care of your teeth and gums.  Regular physical activity.  Eating a healthy diet.  Avoiding tobacco and drug use.  Limiting alcohol use.  Practicing safe sex.  Taking low-dose aspirin every day.  Taking vitamin and mineral supplements as recommended by your health care provider. What happens during an annual well check? The services and screenings done by your health care provider during your annual well check will depend on  your age, overall health, lifestyle risk factors, and family history of disease. Counseling  Your health care provider may ask you questions about your:  Alcohol use.  Tobacco use.  Drug use.  Emotional well-being.  Home and relationship well-being.  Sexual activity.  Eating habits.  History of falls.  Memory and ability to understand (cognition).  Work and work Statistician.  Reproductive health. Screening  You may have the following tests or measurements:  Height, weight, and BMI.  Blood pressure.  Lipid and cholesterol levels. These may be checked every 5 years, or more frequently if you are over 5 years old.  Skin check.  Lung cancer screening. You may have this screening every year starting at age 37 if you have a 30-pack-year history of smoking and currently smoke or have quit within the past 15 years.  Fecal occult blood test (FOBT) of the stool. You may have this test every year starting at age 82.  Flexible sigmoidoscopy or colonoscopy. You may have a sigmoidoscopy every 5 years or a colonoscopy every 10 years starting at age 29.  Hepatitis C blood test.  Hepatitis B blood test.  Sexually transmitted disease (STD) testing.  Diabetes screening. This is done by checking your blood sugar (glucose) after you have not eaten for a while (fasting). You may have this done every 1-3 years.  Bone density scan. This is done to screen for osteoporosis. You may have this done starting at age 57.  Mammogram. This may be done every 1-2 years. Talk to your health care provider about how often you should have regular mammograms. Talk with your health care provider about your test results,  treatment options, and if necessary, the need for more tests. Vaccines  Your health care provider may recommend certain vaccines, such as:  Influenza vaccine. This is recommended every year.  Tetanus, diphtheria, and acellular pertussis (Tdap, Td) vaccine. You may need a Td booster  every 10 years.  Zoster vaccine. You may need this after age 38.  Pneumococcal 13-valent conjugate (PCV13) vaccine. One dose is recommended after age 11.  Pneumococcal polysaccharide (PPSV23) vaccine. One dose is recommended after age 61. Talk to your health care provider about which screenings and vaccines you need and how often you need them. This information is not intended to replace advice given to you by your health care provider. Make sure you discuss any questions you have with your health care provider. Document Released: 01/26/2015 Document Revised: 09/19/2015 Document Reviewed: 10/31/2014 Elsevier Interactive Patient Education  2017 [place] Prevention in the Home Falls can cause injuries. They can happen to people of all ages. There are many things you can do to make your home safe and to help prevent falls. What can I do on the outside of my home?  Regularly fix the edges of walkways and driveways and fix any cracks.  Remove anything that might make you trip as you walk through a door, such as a raised step or threshold.  Trim any bushes or trees on the path to your home.  Use bright outdoor lighting.  Clear any walking paths of anything that might make someone trip, such as rocks or tools.  Regularly check to see if handrails are loose or broken. Make sure that both sides of any steps have handrails.  Any raised decks and porches should have guardrails on the edges.  Have any leaves, snow, or ice cleared regularly.  Use sand or salt on walking paths during winter.  Clean up any spills in your garage right away. This includes oil or grease spills. What can I do in the bathroom?  Use night lights.  Install grab bars by the toilet and in the tub and shower. Do not use towel bars as grab bars.  Use non-skid mats or decals in the tub or shower.  If you need to sit down in the shower, use a plastic, non-slip stool.  Keep the floor dry. Clean up any  water that spills on the floor as soon as it happens.  Remove soap buildup in the tub or shower regularly.  Attach bath mats securely with double-sided non-slip rug tape.  Do not have throw rugs and other things on the floor that can make you trip. What can I do in the bedroom?  Use night lights.  Make sure that you have a light by your bed that is easy to reach.  Do not use any sheets or blankets that are too big for your bed. They should not hang down onto the floor.  Have a firm chair that has side arms. You can use this for support while you get dressed.  Do not have throw rugs and other things on the floor that can make you trip. What can I do in the kitchen?  Clean up any spills right away.  Avoid walking on wet floors.  Keep items that you use a lot in easy-to-reach places.  If you need to reach something above you, use a strong step stool that has a grab bar.  Keep electrical cords out of the way.  Do not use floor polish or wax that makes floors  slippery. If you must use wax, use non-skid floor wax.  Do not have throw rugs and other things on the floor that can make you trip. What can I do with my stairs?  Do not leave any items on the stairs.  Make sure that there are handrails on both sides of the stairs and use them. Fix handrails that are broken or loose. Make sure that handrails are as long as the stairways.  Check any carpeting to make sure that it is firmly attached to the stairs. Fix any carpet that is loose or worn.  Avoid having throw rugs at the top or bottom of the stairs. If you do have throw rugs, attach them to the floor with carpet tape.  Make sure that you have a light switch at the top of the stairs and the bottom of the stairs. If you do not have them, ask someone to add them for you. What else can I do to help prevent falls?  Wear shoes that:  Do not have high heels.  Have rubber bottoms.  Are comfortable and fit you well.  Are closed  at the toe. Do not wear sandals.  If you use a stepladder:  Make sure that it is fully opened. Do not climb a closed stepladder.  Make sure that both sides of the stepladder are locked into place.  Ask someone to hold it for you, if possible.  Clearly mark and make sure that you can see:  Any grab bars or handrails.  First and last steps.  Where the edge of each step is.  Use tools that help you move around (mobility aids) if they are needed. These include:  Canes.  Walkers.  Scooters.  Crutches.  Turn on the lights when you go into a dark area. Replace any light bulbs as soon as they burn out.  Set up your furniture so you have a clear path. Avoid moving your furniture around.  If any of your floors are uneven, fix them.  If there are any pets around you, be aware of where they are.  Review your medicines with your doctor. Some medicines can make you feel dizzy. This can increase your chance of falling. Ask your doctor what other things that you can do to help prevent falls. This information is not intended to replace advice given to you by your health care provider. Make sure you discuss any questions you have with your health care provider. Document Released: 10/26/2008 Document Revised: 06/07/2015 Document Reviewed: 02/03/2014 Elsevier Interactive Patient Education  2017 [employer].

## 2019-04-27 NOTE — Progress Notes (Signed)
Subjective:   Kimberly Klein is a 69 y.o. female who presents for an Initial Medicare Annual Wellness Visit.  This visit is being conducted via phone call  - after an attmept to do on video chat - due to the COVID-19 pandemic. This patient has given me verbal consent via phone to conduct this visit, patient states they are participating from their home address. Some vital signs may be absent or patient reported.   Patient identification: identified by name, DOB, and current address.    Review of Systems      Cardiac Risk Factors include: advanced age (>79mn, >>70women);hypertension     Objective:    Today's Vitals   04/27/19 0818  BP: 138/80  Weight: 160 lb (72.6 kg)  Height: '5\' 1"'  (1.549 m)   Body mass index is 30.23 kg/m.  Advanced Directives 04/27/2019 05/30/2016 03/28/2015 12/13/2013  Does Patient Have a Medical Advance Directive? Yes No No No  Type of Advance Directive Living will;Healthcare Power of Attorney - - -  Copy of HReweyin Chart? No - copy requested - - -  Would patient like information on creating a medical advance directive? - No - Patient declined No - patient declined information -    Current Medications (verified) Outpatient Encounter Medications as of 04/27/2019  Medication Sig  . amLODipine (NORVASC) 5 MG tablet Take 1 tablet (5 mg total) by mouth daily.  .Marland Kitchenlisinopril (ZESTRIL) 20 MG tablet Take 1 tablet (20 mg total) by mouth daily.  . Multiple Vitamins-Minerals (PRESERVISION AREDS 2+MULTI VIT PO) Take by mouth.  . ALPRAZolam (XANAX) 0.5 MG tablet Take 1 tablet (0.5 mg total) by mouth 3 (three) times daily as needed for anxiety. Take prior to dental surgery (Patient not taking: Reported on 04/27/2019)   No facility-administered encounter medications on file as of 04/27/2019.    Allergies (verified) Patient has no known allergies.   History: Past Medical History:  Diagnosis Date  . SVT (supraventricular tachycardia) ([facility]  07/2013   strees test- normal   Past Surgical History:  Procedure Laterality Date  . appendectomy     Family History  Problem Relation Age of Onset  . Hypertension Father   . Hyperlipidemia Father   . Heart attack Father   . Stroke Father   . Bladder Cancer Brother   . Liver cancer Sister   . Multiple myeloma Brother    Social History   Socioeconomic History  . Marital status: Widowed    Spouse name: Not on file  . Number of children: Not on file  . Years of education: Not on file  . Highest education level: Not on file  Occupational History  . Occupation: PRN RN   Tobacco Use  . Smoking status: Former Smoker    Years: 20.00    Types: Cigarettes    Quit date: 10/03/2007    Years since quitting: 11.5  . Smokeless tobacco: Never Used  Substance and Sexual Activity  . Alcohol use: Yes    Alcohol/week: 4.0 - 5.0 standard drinks    Types: 4 - 5 Glasses of wine per week    Comment: occasional  . Drug use: No  . Sexual activity: Not Currently    Birth control/protection: Post-menopausal  Other Topics Concern  . Not on file  Social History Narrative  . Not on file   Social Determinants of Health   Financial Resource Strain:   . Difficulty of Paying Living Expenses:   Food Insecurity:   .  Worried About Charity fundraiser in the Last Year:   . Arboriculturist in the Last Year:   Transportation Needs:   . Film/video editor (Medical):   Marland Kitchen Lack of Transportation (Non-Medical):   Physical Activity:   . Days of Exercise per Week:   . Minutes of Exercise per Session:   Stress:   . Feeling of Stress :   Social Connections:   . Frequency of Communication with Friends and Family:   . Frequency of Social Gatherings with Friends and Family:   . Attends Religious Services:   . Active Member of Clubs or Organizations:   . Attends Archivist Meetings:   Marland Kitchen Marital Status:     Tobacco Counseling Counseling given: Not Answered   Clinical  Intake:  Pre-visit preparation completed: Yes  Pain : No/denies pain     Nutritional Status: BMI > 30  Obese Nutritional Risks: None Diabetes: No  How often do you need to have someone help you when you read instructions, pamphlets, or other written materials from your doctor or pharmacy?: 1 - Never  Interpreter Needed?: No  Information entered by ::  ,LPN   Activities of Daily Living In your present state of health, do you have any difficulty performing the following activities: 04/27/2019  Hearing? N  Comment no hearing aids  Vision? N  Comment eyeglasses, macular degeneration. Dr.Matthews.  Difficulty concentrating or making decisions? N  Walking or climbing stairs? N  Dressing or bathing? N  Doing errands, shopping? N  Preparing Food and eating ? N  Using the Toilet? N  In the past six months, have you accidently leaked urine? N  Do you have problems with loss of bowel control? N  Managing your Medications? N  Managing your Finances? N  Housekeeping or managing your Housekeeping? N  Some recent data might be hidden     Immunizations and Health Maintenance Immunization History  Administered Date(s) Administered  . Influenza-Unspecified 09/28/2014  . PFIZER SARS-COV-2 Vaccination 01/04/2019, 01/25/2019  . Pneumococcal Conjugate-13 10/28/2013  . Pneumococcal Polysaccharide-23 09/27/2018   Health Maintenance Due  Topic Date Due  . MAMMOGRAM  Never done    Patient Care Team: Valerie Roys, DO as PCP - General (Family Medicine)  Indicate any recent Medical Services you may have received from other than [facility] providers in the past year (date may be approximate).     Assessment:   This is a routine wellness examination for Kimberly Klein.  Hearing/Vision screen No exam data present  Dietary issues and exercise activities discussed: Current Exercise Habits: Home exercise routine, Type of exercise: walking, Time (Minutes): 60(2 miles a day), Frequency  (Times/Week): 7, Weekly Exercise (Minutes/Week): 420, Intensity: Mild, Exercise limited by: None identified  Goals Addressed   None    Depression Screen PHQ 2/9 Scores 04/27/2019 05/30/2016  PHQ - 2 Score 0 0    Fall Risk Fall Risk  04/27/2019 12/08/2018 08/11/2018 05/30/2016  Falls in the past year? 0 0 (No Data) No  Comment - Emmi Telephone Survey: data to providers prior to load Emmi Telephone Survey: data to providers prior to load -  Number falls in past yr: 0 - (No Data) -  Comment - - Emmi Telephone Survey Actual Response =  -  Injury with Fall? 0 - - -   FALL RISK PREVENTION PERTAINING TO THE HOME:  Any stairs in or around the home? Yes  steps going in home  If so, are there any  without handrails? No   Home free of loose throw rugs in walkways, pet beds, electrical cords, etc? Yes  Adequate lighting in your home to reduce risk of falls? Yes   ASSISTIVE DEVICES UTILIZED TO PREVENT FALLS:  Life alert? No  Use of a cane, walker or w/c? No  Grab bars in the bathroom? No  Shower chair or bench in shower? No  Elevated toilet seat or a handicapped toilet? No    DME ORDERS:  DME order needed?  No   TIMED UP AND GO:  Unable to perform    Cognitive Function:     6CIT Screen 05/30/2016  What Year? 0 points  What month? 0 points  What time? 0 points  Count back from 20 0 points  Months in reverse 0 points  Repeat phrase 0 points  Total Score 0    Screening Tests Health Maintenance  Topic Date Due  . MAMMOGRAM  Never done  . TETANUS/TDAP  04/03/2020 (Originally 07/21/1969)  . INFLUENZA VACCINE  08/14/2019  . COLONOSCOPY  12/14/2023  . DEXA SCAN  Completed  . Hepatitis C Screening  Completed  . PNA vac Low Risk Adult  Completed    Qualifies for Shingles Vaccine? Yes  Zostavax completed n/a. Due for Shingrix. Education has been provided regarding the importance of this vaccine. Pt has been advised to call insurance company to determine out of pocket expense.  Advised may also receive vaccine at local pharmacy or Health Dept. Verbalized acceptance and understanding.  Tdap: Discussed need for TD/TDAP vaccine, patient verbalized understanding that this is not covered as a preventative with there insurance and to call the office if she develops any new skin injuries, ie: cuts, scrapes, bug bites, or open wounds.  Flu Vaccine: up to date, does with work   Pneumococcal Vaccine: up to date   Covid-19 Vaccine:Completed vaccines  Cancer Screenings:  Colorectal Screening: Completed 2015 Repeat every 10 years  Mammogram: ordered for [facility] per pt request   Bone Density: completed 2010  Lung Cancer Screening: (Low Dose CT Chest recommended if Age 38-80 years, 30 pack-year currently smoking OR have quit w/in 15years.) does not qualify.     Additional Screening:  Hepatitis C Screening: does qualify; Completed 2021  Vision Screening: Recommended annual ophthalmology exams for early detection of glaucoma and other disorders of the eye. Is the patient up to date with their annual eye exam?  Yes  Who is the provider or what is the name of the office in which the pt attends annual eye exams? Dr.Matthews   Dental Screening: Recommended annual dental exams for proper oral hygiene  Community Resource Referral:  CRR required this visit?  No       Plan:  I have personally reviewed and addressed the Medicare Annual Wellness questionnaire and have noted the following in the patient's chart:  A. Medical and social history B. Use of alcohol, tobacco or illicit drugs  C. Current medications and supplements D. Functional ability and status E.  Nutritional status F.  Physical activity G. Advance directives H. List of other physicians I.  Hospitalizations, surgeries, and ER visits in previous 12 months J.  [place]ats such as hearing and vision if needed, cognitive and depression L. Referrals and appointments   In addition, I  have reviewed and discussed with patient certain preventive protocols, quality metrics, and best practice recommendations. A written personalized care plan for preventive services as well as general preventive health recommendations were provided to  patient.   Signed,    Bevelyn Ngo, LPN   07/14/2024  Nurse Health Advisor   Nurse Notes: none

## 2019-04-28 ENCOUNTER — Ambulatory Visit: Payer: PPO | Admitting: Family Medicine

## 2019-05-11 DIAGNOSIS — H353132 Nonexudative age-related macular degeneration, bilateral, intermediate dry stage: Secondary | ICD-10-CM | POA: Diagnosis not present

## 2019-05-11 DIAGNOSIS — H25813 Combined forms of age-related cataract, bilateral: Secondary | ICD-10-CM | POA: Diagnosis not present

## 2019-05-20 ENCOUNTER — Other Ambulatory Visit: Payer: Self-pay | Admitting: Family Medicine

## 2019-05-25 DIAGNOSIS — L570 Actinic keratosis: Secondary | ICD-10-CM | POA: Diagnosis not present

## 2019-05-25 DIAGNOSIS — L820 Inflamed seborrheic keratosis: Secondary | ICD-10-CM | POA: Diagnosis not present

## 2019-05-27 ENCOUNTER — Ambulatory Visit: Payer: PPO | Admitting: Family Medicine

## 2019-05-30 ENCOUNTER — Other Ambulatory Visit: Payer: Self-pay | Admitting: Family Medicine

## 2019-05-30 NOTE — Telephone Encounter (Signed)
Does she have enough to get to her appointment?

## 2019-05-30 NOTE — Telephone Encounter (Signed)
Routing to provider  

## 2019-05-30 NOTE — Telephone Encounter (Signed)
Called patient, no answer, unable to leave a message. Will try again.  

## 2019-05-30 NOTE — Telephone Encounter (Signed)
Requested medication (s) are due for refill today: no  Requested medication (s) are on the active medication list: yes  Last refill:  05/01/2019  Future visit scheduled: yes  Notes to clinic:  please verify direction on the Lisinopril   Requested Prescriptions  Pending Prescriptions Disp Refills   amLODipine (NORVASC) 5 MG tablet [Pharmacy Med Name: AMLODIPINE BESYLATE 5MG  TABLETS] 30 tablet 1    Sig: TAKE 1 TABLET(5 MG) BY MOUTH DAILY      Cardiovascular:  Calcium Channel Blockers Passed - 05/30/2019  3:45 AM      Passed - Last BP in normal range    BP Readings from Last 1 Encounters:  04/27/19 138/80          Passed - Valid encounter within last 6 months    Recent Outpatient Visits           1 month ago Essential hypertension   [place], Megan P, DO   8 months ago Acute nonintractable headache, unspecified headache type   [facility], Megan P, DO   2 years ago Elevated blood pressure reading in office with white coat syndrome, without diagnosis of hypertension   [place], Megan P, DO   3 years ago Medicare annual wellness visit, subsequent   [employer], [place], DO   4 years ago Right-sided thoracic back pain   [employer], [place], DO       Future Appointments             In 1 week Johnson, Megan P, DO [place], [facility]              lisinopril (ZESTRIL) 20 MG tablet [employer] Med Name: LISINOPRIL 20MG  TABLETS] 30 tablet 1    Sig: TAKE 1 TABLET(20 MG) BY MOUTH DAILY      Cardiovascular:  ACE Inhibitors Passed - 05/30/2019  3:45 AM      Passed - Cr in normal range and within 180 days    Creatinine  Date Value Ref Range Status  07/18/2013 0.94 0.60 - 1.30 mg/dL Final   Creatinine, Ser  Date Value Ref Range Status  04/04/2019 0.82 0.57 - 1.00 mg/dL Final          Passed - K in normal range and within 180 days    Potassium  Date  Value Ref Range Status  04/04/2019 4.4 3.5 - 5.2 mmol/L Final  07/18/2013 4.1 3.5 - 5.1 mmol/L Final          Passed - Patient is not pregnant      Passed - Last BP in normal range    BP Readings from Last 1 Encounters:  04/27/19 138/80          Passed - Valid encounter within last 6 months    Recent Outpatient Visits           1 month ago Essential hypertension   [place], Megan P, DO   8 months ago Acute nonintractable headache, unspecified headache type   [facility], Megan P, DO   2 years ago Elevated blood pressure reading in office with white coat syndrome, without diagnosis of hypertension   [place], Megan P, DO   3 years ago Medicare annual wellness visit, subsequent   [employer], [place], DO   4 years ago Right-sided thoracic back pain   [facility] [place], Megan  P, DO       Future Appointments             In 1 week Johnson, Barb Merino, DO [place], [facility]

## 2019-05-31 NOTE — Telephone Encounter (Signed)
Called patient, no answer, unable to leave a message. Will try again.  

## 2019-06-01 NOTE — Telephone Encounter (Signed)
Called and spoke with patient, she states that she does not have enough medication to get to her appt.   [facility] [address] [employer] [address]

## 2019-06-10 ENCOUNTER — Ambulatory Visit (INDEPENDENT_AMBULATORY_CARE_PROVIDER_SITE_OTHER): Payer: PPO | Admitting: Family Medicine

## 2019-06-10 ENCOUNTER — Other Ambulatory Visit: Payer: Self-pay

## 2019-06-10 ENCOUNTER — Encounter: Payer: Self-pay | Admitting: Family Medicine

## 2019-06-10 VITALS — BP 126/80 | HR 97 | Temp 98.1°F | Ht 62.0 in | Wt 178.0 lb

## 2019-06-10 DIAGNOSIS — I10 Essential (primary) hypertension: Secondary | ICD-10-CM | POA: Diagnosis not present

## 2019-06-10 DIAGNOSIS — B001 Herpesviral vesicular dermatitis: Secondary | ICD-10-CM | POA: Diagnosis not present

## 2019-06-10 MED ORDER — AMLODIPINE BESYLATE 5 MG PO TABS: ORAL_TABLET | ORAL | 1 refills | Status: DC

## 2019-06-10 MED ORDER — LISINOPRIL 20 MG PO TABS: ORAL_TABLET | ORAL | 1 refills | Status: DC

## 2019-06-10 MED ORDER — VALACYCLOVIR HCL 1 G PO TABS: 1000.0000 mg | ORAL_TABLET | Freq: Two times a day (BID) | ORAL | 6 refills | Status: DC

## 2019-06-10 NOTE — Assessment & Plan Note (Signed)
Under good control on current regimen. Continue current regimen. Continue to monitor. Call with any concerns. Refills given. Labs drawn today.   

## 2019-06-10 NOTE — Progress Notes (Signed)
BP 126/80 (BP Location: Left Arm, Cuff Size: Normal)   Pulse 97   Temp 98.1 F (36.7 C) (Oral)   Ht 5\' 2"  (1.575 m)   Wt 178 lb (80.7 kg)   SpO2 96%   BMI 32.56 kg/m    Subjective:    Patient ID: Kimberly Klein, female    DOB: July 02, 1950, 70 y.o.   MRN: [id number]  HPI: Kimberly Klein is a 69 y.o. female  Chief Complaint  Patient presents with  . Hypertension   HYPERTENSION Hypertension status: better  Satisfied with current treatment? yes Duration of hypertension: chronic BP monitoring frequency:  a few times a month BP range: 120s BP medication side effects:  no Medication compliance: excellent compliance Previous BP meds:lisinopril, amlodipine Aspirin: no Recurrent headaches: no Visual changes: no Palpitations: no Dyspnea: no Chest pain: no Lower extremity edema: no Dizzy/lightheaded: no  Relevant past medical, surgical, family and social history reviewed and updated as indicated. Interim medical history since our last visit reviewed. Allergies and medications reviewed and updated.  Review of Systems  Constitutional: Negative.   Respiratory: Negative.   Cardiovascular: Negative.   Musculoskeletal: Negative.   Skin: Positive for rash. Negative for color change, pallor and wound.  Psychiatric/Behavioral: Negative.     Per HPI unless specifically indicated above     Objective:    BP 126/80 (BP Location: Left Arm, Cuff Size: Normal)   Pulse 97   Temp 98.1 F (36.7 C) (Oral)   Ht 5\' 2"  (1.575 m)   Wt 178 lb (80.7 kg)   SpO2 96%   BMI 32.56 kg/m   Wt Readings from Last 3 Encounters:  06/10/19 178 lb (80.7 kg)  04/27/19 160 lb (72.6 kg)  04/04/19 174 lb (78.9 kg)    Physical Exam Vitals and nursing note reviewed.  Constitutional:      General: She is not in acute distress.    Appearance: Normal appearance. She is not ill-appearing, toxic-appearing or diaphoretic.  HENT:     Head: Normocephalic and atraumatic.     Right Ear: External ear  normal.     Left Ear: External ear normal.     Nose: Nose normal.     Mouth/Throat:     Mouth: Mucous membranes are moist.     Pharynx: Oropharynx is clear.  Eyes:     General: No scleral icterus.       Right eye: No discharge.        Left eye: No discharge.     Extraocular Movements: Extraocular movements intact.     Conjunctiva/sclera: Conjunctivae normal.     Pupils: Pupils are equal, round, and reactive to light.  Cardiovascular:     Rate and Rhythm: Normal rate and regular rhythm.     Pulses: Normal pulses.     Heart sounds: Normal heart sounds. No murmur. No friction rub. No gallop.   Pulmonary:     Effort: Pulmonary effort is normal. No respiratory distress.     Breath sounds: Normal breath sounds. No stridor. No wheezing, rhonchi or rales.  Chest:     Chest wall: No tenderness.  Musculoskeletal:        General: Normal range of motion.     Cervical back: Normal range of motion and neck supple.  Skin:    General: Skin is warm and dry.     Capillary Refill: Capillary refill takes less than 2 seconds.     Coloration: Skin is not jaundiced or pale.  Findings: No bruising, erythema, lesion or rash.     Comments: Cold sore on R side of her mouth  Neurological:     General: No focal deficit present.     Mental Status: She is alert and oriented to person, place, and time. Mental status is at baseline.  Psychiatric:        Mood and Affect: Mood normal.        Behavior: Behavior normal.        Thought Content: Thought content normal.        Judgment: Judgment normal.     Results for orders placed or performed in visit on 04/04/19  Comprehensive metabolic panel  Result Value Ref Range   Glucose 96 65 - 99 mg/dL   BUN 15 8 - 27 mg/dL   Creatinine, Ser 0.82 0.57 - 1.00 mg/dL   GFR calc non Af Amer 73 >59 mL/min/1.73   GFR calc Af Amer 84 >59 mL/min/1.73   BUN/Creatinine Ratio 18 12 - 28   Sodium 140 134 - 144 mmol/L   Potassium 4.4 3.5 - 5.2 mmol/L   Chloride 100 96  - 106 mmol/L   CO2 23 20 - 29 mmol/L   Calcium 9.9 8.7 - 10.3 mg/dL   Total Protein 7.0 6.0 - 8.5 g/dL   Albumin 4.3 3.8 - 4.8 g/dL   Globulin, Total 2.7 1.5 - 4.5 g/dL   Albumin/Globulin Ratio 1.6 1.2 - 2.2   Bilirubin Total 0.3 0.0 - 1.2 mg/dL   Alkaline Phosphatase 89 39 - 117 IU/L   AST 21 0 - 40 IU/L   ALT 14 0 - 32 IU/L  Lipid Panel w/o Chol/HDL Ratio  Result Value Ref Range   Cholesterol, Total 241 (H) 100 - 199 mg/dL   Triglycerides 130 0 - 149 mg/dL   HDL 89 >39 mg/dL   VLDL Cholesterol Cal 22 5 - 40 mg/dL   LDL Chol Calc (NIH) 130 (H) 0 - 99 mg/dL  Hepatitis C Antibody  Result Value Ref Range   Hep C Virus Ab <0.1 0.0 - 0.9 s/co ratio  Microalbumin, Urine Waived  Result Value Ref Range   Microalb, Ur Waived 10 0 - 19 mg/L   Creatinine, Urine Waived 10 10 - 300 mg/dL   Microalb/Creat Ratio <30 <30 mg/g      Assessment & Plan:   Problem List Items Addressed This Visit      Cardiovascular and Mediastinum   HTN (hypertension) - Primary    Under good control on current regimen. Continue current regimen. Continue to monitor. Call with any concerns. Refills given. Labs drawn today.       Relevant Medications   lisinopril (ZESTRIL) 20 MG tablet   amLODipine (NORVASC) 5 MG tablet   Other Relevant Orders   Basic metabolic panel     Digestive   Recurrent cold sores    Will treat with valtrex. Call with any concerns or if not getting better.       Relevant Medications   valACYclovir (VALTREX) 1000 MG tablet       Follow up plan: Return in about 6 months (around 12/11/2019) for Physical/wellness.

## 2019-06-10 NOTE — Assessment & Plan Note (Signed)
Will treat with valtrex. Call with any concerns or if not getting better.

## 2019-06-11 LAB — BASIC METABOLIC PANEL
BUN/Creatinine Ratio: 18 (ref 12–28)
BUN: 16 mg/dL (ref 8–27)
CO2: 24 mmol/L (ref 20–29)
Calcium: 9.9 mg/dL (ref 8.7–10.3)
Chloride: 100 mmol/L (ref 96–106)
Creatinine, Ser: 0.89 mg/dL (ref 0.57–1.00)
GFR calc Af Amer: 76 mL/min/{1.73_m2} (ref >59)
GFR calc non Af Amer: 66 mL/min/{1.73_m2} (ref >59)
Glucose: 102 mg/dL — ABNORMAL HIGH (ref 65–99)
Potassium: 3.9 mmol/L (ref 3.5–5.2)
Sodium: 138 mmol/L (ref 134–144)

## 2019-06-23 DIAGNOSIS — H25811 Combined forms of age-related cataract, right eye: Secondary | ICD-10-CM | POA: Diagnosis not present

## 2019-06-23 DIAGNOSIS — H268 Other specified cataract: Secondary | ICD-10-CM | POA: Diagnosis not present

## 2019-08-07 DIAGNOSIS — H2512 Age-related nuclear cataract, left eye: Secondary | ICD-10-CM | POA: Diagnosis not present

## 2019-08-11 DIAGNOSIS — H2512 Age-related nuclear cataract, left eye: Secondary | ICD-10-CM | POA: Diagnosis not present

## 2019-12-12 ENCOUNTER — Ambulatory Visit: Payer: PPO | Admitting: Family Medicine

## 2020-01-02 ENCOUNTER — Telehealth: Payer: Self-pay

## 2020-01-02 DIAGNOSIS — Z1231 Encounter for screening mammogram for malignant neoplasm of breast: Secondary | ICD-10-CM

## 2020-01-02 NOTE — Telephone Encounter (Signed)
Of course- can we see if we have the form for [place] [facility]?

## 2020-01-02 NOTE — Telephone Encounter (Signed)
Can this be ordered?

## 2020-01-02 NOTE — Telephone Encounter (Signed)
Copied from [place] [id number]. Topic: Referral - Request for Referral >> Jan 02, 2020  9:05 AM Leward Quan A wrote: Has patient seen PCP for this complaint? Yes.   *If NO, is insurance requiring patient see PCP for this issue before PCP can refer them? Referral for which specialty: Radiology Preferred provider/office: [facility] on [address] Reason for referral: Mammogram

## 2020-01-03 ENCOUNTER — Ambulatory Visit: Payer: PPO | Admitting: Family Medicine

## 2020-01-14 DIAGNOSIS — Z8669 Personal history of other diseases of the nervous system and sense organs: Secondary | ICD-10-CM

## 2020-01-14 HISTORY — PX: EYE SURGERY: SHX253

## 2020-01-14 HISTORY — PX: CATARACT EXTRACTION W/ INTRAOCULAR LENS IMPLANT: SHX1309

## 2020-01-14 HISTORY — DX: Personal history of other diseases of the nervous system and sense organs: Z86.69

## 2020-02-07 ENCOUNTER — Ambulatory Visit: Payer: PPO | Admitting: Family Medicine

## 2020-02-24 ENCOUNTER — Other Ambulatory Visit: Payer: Self-pay | Admitting: Family Medicine

## 2020-02-24 ENCOUNTER — Ambulatory Visit
Admission: RE | Admit: 2020-02-24 | Discharge: 2020-02-24 | Disposition: A | Discharge status: Home / Self Care | Payer: PPO | Source: Ambulatory Visit | Attending: Family Medicine | Admitting: Family Medicine

## 2020-02-24 ENCOUNTER — Other Ambulatory Visit: Payer: Self-pay

## 2020-02-24 DIAGNOSIS — Z1231 Encounter for screening mammogram for malignant neoplasm of breast: Secondary | ICD-10-CM

## 2020-03-01 ENCOUNTER — Encounter: Payer: Self-pay | Admitting: Family Medicine

## 2020-03-05 ENCOUNTER — Encounter: Payer: Self-pay | Admitting: Family Medicine

## 2020-03-05 ENCOUNTER — Ambulatory Visit (INDEPENDENT_AMBULATORY_CARE_PROVIDER_SITE_OTHER): Payer: PPO | Admitting: Family Medicine

## 2020-03-05 ENCOUNTER — Other Ambulatory Visit: Payer: Self-pay

## 2020-03-05 VITALS — BP 122/82 | HR 74 | Temp 97.7°F

## 2020-03-05 DIAGNOSIS — I10 Essential (primary) hypertension: Secondary | ICD-10-CM | POA: Diagnosis not present

## 2020-03-05 DIAGNOSIS — K219 Gastro-esophageal reflux disease without esophagitis: Secondary | ICD-10-CM

## 2020-03-05 MED ORDER — LISINOPRIL 20 MG PO TABS: ORAL_TABLET | ORAL | 1 refills | Status: DC

## 2020-03-05 MED ORDER — AMLODIPINE BESYLATE 5 MG PO TABS: ORAL_TABLET | ORAL | 1 refills | Status: DC

## 2020-03-05 NOTE — Assessment & Plan Note (Signed)
Under good control off medicine. Continue current regimen. Continue to monitor. Call with any concerns.  

## 2020-03-05 NOTE — Assessment & Plan Note (Signed)
Under good control on current regimen. Continue current regimen. Continue to monitor. Call with any concerns. Refills given. Labs drawn today.   

## 2020-03-05 NOTE — Progress Notes (Signed)
BP 122/82   Pulse 74   Temp 97.7 F (36.5 C)   SpO2 98%    Subjective:    Patient ID: Kimberly Klein, female    DOB: 05/08/1950, 70 y.o.   MRN: [id number]  HPI: Kimberly Klein is a 70 y.o. female  Chief Complaint  Patient presents with  . Hypertension   HYPERTENSION Hypertension status: stable  Satisfied with current treatment? no Duration of hypertension: chronic BP monitoring frequency:  not checking BP range: 140s/80s BP medication side effects:  no Medication compliance: excellent compliance Previous BP meds: amlodipine, lisinopril Aspirin: no Recurrent headaches: no Visual changes: no Palpitations: no Dyspnea: no Chest pain: no Lower extremity edema: no Dizzy/lightheaded: no  Relevant past medical, surgical, family and social history reviewed and updated as indicated. Interim medical history since our last visit reviewed. Allergies and medications reviewed and updated.  Review of Systems  Constitutional: Negative.   Respiratory: Negative.   Cardiovascular: Negative.   Gastrointestinal: Negative.   Musculoskeletal: Negative.   Psychiatric/Behavioral: Negative.     Per HPI unless specifically indicated above     Objective:    BP 122/82   Pulse 74   Temp 97.7 F (36.5 C)   SpO2 98%   Wt Readings from Last 3 Encounters:  06/10/19 178 lb (80.7 kg)  04/27/19 160 lb (72.6 kg)  04/04/19 174 lb (78.9 kg)    Physical Exam Vitals and nursing note reviewed.  Constitutional:      General: She is not in acute distress.    Appearance: Normal appearance. She is not ill-appearing, toxic-appearing or diaphoretic.  HENT:     Head: Normocephalic and atraumatic.     Right Ear: External ear normal.     Left Ear: External ear normal.     Nose: Nose normal.     Mouth/Throat:     Mouth: Mucous membranes are moist.     Pharynx: Oropharynx is clear.  Eyes:     General: No scleral icterus.       Right eye: No discharge.        Left eye: No discharge.      Extraocular Movements: Extraocular movements intact.     Conjunctiva/sclera: Conjunctivae normal.     Pupils: Pupils are equal, round, and reactive to light.  Cardiovascular:     Rate and Rhythm: Normal rate and regular rhythm.     Pulses: Normal pulses.     Heart sounds: Normal heart sounds. No murmur heard. No friction rub. No gallop.   Pulmonary:     Effort: Pulmonary effort is normal. No respiratory distress.     Breath sounds: Normal breath sounds. No stridor. No wheezing, rhonchi or rales.  Chest:     Chest wall: No tenderness.  Musculoskeletal:        General: Normal range of motion.     Cervical back: Normal range of motion and neck supple.  Skin:    General: Skin is warm and dry.     Capillary Refill: Capillary refill takes less than 2 seconds.     Coloration: Skin is not jaundiced or pale.     Findings: No bruising, erythema, lesion or rash.  Neurological:     General: No focal deficit present.     Mental Status: She is alert and oriented to person, place, and time. Mental status is at baseline.  Psychiatric:        Mood and Affect: Mood normal.        Behavior:  Behavior normal.        Thought Content: Thought content normal.        Judgment: Judgment normal.     Results for orders placed or performed in visit on 71/24/58  Basic metabolic panel  Result Value Ref Range   Glucose 102 (H) 65 - 99 mg/dL   BUN 16 8 - 27 mg/dL   Creatinine, Ser 0.89 0.57 - 1.00 mg/dL   GFR calc non Af Amer 66 >59 mL/min/1.73   GFR calc Af Amer 76 >59 mL/min/1.73   BUN/Creatinine Ratio 18 12 - 28   Sodium 138 134 - 144 mmol/L   Potassium 3.9 3.5 - 5.2 mmol/L   Chloride 100 96 - 106 mmol/L   CO2 24 20 - 29 mmol/L   Calcium 9.9 8.7 - 10.3 mg/dL      Assessment & Plan:   Problem List Items Addressed This Visit      Cardiovascular and Mediastinum   HTN (hypertension) - Primary    Under good control on current regimen. Continue current regimen. Continue to monitor. Call with any  concerns. Refills given. Labs drawn today.        Relevant Medications   amLODipine (NORVASC) 5 MG tablet   lisinopril (ZESTRIL) 20 MG tablet   Other Relevant Orders   Basic metabolic panel     Digestive   GERD (gastroesophageal reflux disease)    Under good control off medicine. Continue current regimen. Continue to monitor. Call with any concerns.        Relevant Orders   CBC with Differential/Platelet       Follow up plan: Return in about 6 months (around 09/02/2020) for physical.

## 2020-03-06 ENCOUNTER — Other Ambulatory Visit: Payer: Self-pay | Admitting: Family Medicine

## 2020-03-06 DIAGNOSIS — R928 Other abnormal and inconclusive findings on diagnostic imaging of breast: Secondary | ICD-10-CM

## 2020-03-06 LAB — CBC WITH DIFFERENTIAL/PLATELET
Basophils Absolute: 0 10*3/uL (ref 0.0–0.2)
Basos: 0 %
EOS (ABSOLUTE): 0.1 10*3/uL (ref 0.0–0.4)
Eos: 1 %
Hematocrit: 40.4 % (ref 34.0–46.6)
Hemoglobin: 13.6 g/dL (ref 11.1–15.9)
Immature Grans (Abs): 0 10*3/uL (ref 0.0–0.1)
Immature Granulocytes: 1 %
Lymphocytes Absolute: 2.5 10*3/uL (ref 0.7–3.1)
Lymphs: 36 %
MCH: 31.4 pg (ref 26.6–33.0)
MCHC: 33.7 g/dL (ref 31.5–35.7)
MCV: 93 fL (ref 79–97)
Monocytes Absolute: 0.6 10*3/uL (ref 0.1–0.9)
Monocytes: 8 %
Neutrophils Absolute: 3.7 10*3/uL (ref 1.4–7.0)
Neutrophils: 54 %
Platelets: 240 10*3/uL (ref 150–450)
RBC: 4.33 x10E6/uL (ref 3.77–5.28)
RDW: 11.7 % (ref 11.7–15.4)
WBC: 6.9 10*3/uL (ref 3.4–10.8)

## 2020-03-06 LAB — BASIC METABOLIC PANEL
BUN/Creatinine Ratio: 14 (ref 12–28)
BUN: 12 mg/dL (ref 8–27)
CO2: 22 mmol/L (ref 20–29)
Calcium: 9.2 mg/dL (ref 8.7–10.3)
Chloride: 100 mmol/L (ref 96–106)
Creatinine, Ser: 0.85 mg/dL (ref 0.57–1.00)
GFR calc Af Amer: 80 mL/min/{1.73_m2} (ref >59)
GFR calc non Af Amer: 69 mL/min/{1.73_m2} (ref >59)
Glucose: 89 mg/dL (ref 65–99)
Potassium: 4.2 mmol/L (ref 3.5–5.2)
Sodium: 140 mmol/L (ref 134–144)

## 2020-03-13 DIAGNOSIS — Z170 Estrogen receptor positive status [ER+]: Secondary | ICD-10-CM

## 2020-03-13 DIAGNOSIS — C50919 Malignant neoplasm of unspecified site of unspecified female breast: Secondary | ICD-10-CM

## 2020-03-13 HISTORY — DX: Malignant neoplasm of unspecified site of unspecified female breast: C50.919

## 2020-03-13 HISTORY — DX: Estrogen receptor positive status (ER+): Z17.0

## 2020-03-15 ENCOUNTER — Other Ambulatory Visit: Payer: Self-pay | Admitting: Family Medicine

## 2020-03-20 ENCOUNTER — Ambulatory Visit
Admission: RE | Admit: 2020-03-20 | Discharge: 2020-03-20 | Disposition: A | Discharge status: Home / Self Care | Payer: PPO | Source: Ambulatory Visit | Attending: Family Medicine | Admitting: Family Medicine

## 2020-03-20 ENCOUNTER — Other Ambulatory Visit: Payer: Self-pay | Admitting: Family Medicine

## 2020-03-20 ENCOUNTER — Other Ambulatory Visit: Payer: Self-pay

## 2020-03-20 DIAGNOSIS — R928 Other abnormal and inconclusive findings on diagnostic imaging of breast: Secondary | ICD-10-CM

## 2020-03-20 DIAGNOSIS — R922 Inconclusive mammogram: Secondary | ICD-10-CM | POA: Diagnosis not present

## 2020-03-20 DIAGNOSIS — R921 Mammographic calcification found on diagnostic imaging of breast: Secondary | ICD-10-CM | POA: Diagnosis not present

## 2020-03-20 DIAGNOSIS — N6489 Other specified disorders of breast: Secondary | ICD-10-CM | POA: Diagnosis not present

## 2020-03-28 ENCOUNTER — Ambulatory Visit
Admission: RE | Admit: 2020-03-28 | Discharge: 2020-03-28 | Disposition: A | Discharge status: Home / Self Care | Payer: PPO | Source: Ambulatory Visit | Attending: Family Medicine | Admitting: Family Medicine

## 2020-03-28 ENCOUNTER — Other Ambulatory Visit: Payer: Self-pay

## 2020-03-28 DIAGNOSIS — R928 Other abnormal and inconclusive findings on diagnostic imaging of breast: Secondary | ICD-10-CM

## 2020-03-28 DIAGNOSIS — D0511 Intraductal carcinoma in situ of right breast: Secondary | ICD-10-CM | POA: Diagnosis not present

## 2020-03-28 DIAGNOSIS — N6325 Unspecified lump in the left breast, overlapping quadrants: Secondary | ICD-10-CM | POA: Diagnosis not present

## 2020-03-28 DIAGNOSIS — D242 Benign neoplasm of left breast: Secondary | ICD-10-CM | POA: Diagnosis not present

## 2020-03-28 HISTORY — PX: BREAST BIOPSY: SHX20

## 2020-03-29 ENCOUNTER — Telehealth: Payer: Self-pay | Admitting: Oncology

## 2020-03-29 ENCOUNTER — Encounter: Payer: Self-pay | Admitting: *Deleted

## 2020-03-29 DIAGNOSIS — D0511 Intraductal carcinoma in situ of right breast: Secondary | ICD-10-CM | POA: Insufficient documentation

## 2020-03-29 NOTE — Telephone Encounter (Signed)
Spoke to patient to confirm afternoon [facility] appointment for 3/23, packet mailed to patient

## 2020-04-03 ENCOUNTER — Encounter: Payer: Self-pay | Admitting: Oncology

## 2020-04-03 NOTE — Progress Notes (Signed)
Kimberly Klein  [phone] [phone]     ID: Kimberly Klein DOB: March 16, 1950  MR#: [id number]  XYI#:[id number]  Patient Care Team: Kimberly Roys, DO as PCP - General (Family Medicine) Kimberly Kaufmann, RN as Oncology Nurse Navigator Kimberly Germany, RN as Oncology Nurse Navigator , Kimberly Dad, MD as Consulting Physician (Oncology) Kimberly Klein, MD as Consulting Physician (General Surgery) Kimberly Pray, MD as Consulting Physician (Radiation Oncology) Kimberly Cruel, MD OTHER MD:  CHIEF COMPLAINT: Ductal carcinoma in situ  CURRENT TREATMENT: awaiting definitive surgery   HISTORY OF CURRENT ILLNESS: Kimberly Klein (pronounced "Kimberly Klein") --goes by "Kimberly Klein" --had routine screening mammography on 02/24/2020 showing possible microcalcifications in the right breast and a possible mass in the left breast. She underwent bilateral diagnostic mammography with tomography and bilateral breast ultrasonography at [facility] on 03/20/2020 showing: breast density category B; lower-outer right breast calcifications spanning at least 5 cm; indeterminate 1 cm upper left breast mass; no abnormal-appearing axillary lymph nodes.  Accordingly on 03/28/2020 she proceeded to biopsy of the bilateral breast areas in question. The pathology from this procedure ([id number]) showed:  1. Left Breast, 12 o'clock  - fibroadenoma; concordant 2. Right Breast, LOQ (posterior)  - ductal carcinoma in situ, intermediate to high grade, with necrosis and calcifications  - Prognostic indicators significant for: estrogen receptor, 20% positive with weak staining intensity and progesterone receptor, 0% negative.  3. Right Breast, LOQ (anterior)  - ductal carcinoma in situ, intermediate to high grade, with necrosis and calcifications  - complex sclerosing lesion with calcifications  Cancer Staging Ductal carcinoma in situ (DCIS) of right breast Staging form: Breast, AJCC  8th Edition - Clinical stage from 04/04/2020: Stage 0 (cTis (DCIS), cN0, cM0, ER+, PR-) - Signed by Kimberly Cruel, MD on 04/04/2020 Stage prefix: Initial diagnosis Nuclear grade: GX Laterality: Right Staged by: Pathologist and managing physician Stage used in treatment planning: Yes National guidelines used in treatment planning: Yes Type of national guideline used in treatment planning: NCCN   The patient's subsequent history is as detailed below.   INTERVAL HISTORY: Kimberly Klein was evaluated in the multidisciplinary breast cancer clinic on 04/04/2020 . Her case was also presented at the multidisciplinary breast cancer conference on the same day. At that time a preliminary plan was proposed: We will attempt lumpectomy but may eventually need mastectomy, adjuvant radiation if breast conservation, no antiestrogens unless prophylactically   REVIEW OF SYSTEMS: There were no specific symptoms leading to the original mammogram, which was routinely scheduled. The patient denies unusual headaches, visual changes, nausea, vomiting, stiff neck, dizziness, or gait imbalance. There has been no cough, phlegm production, or pleurisy, no chest pain or pressure, and no change in bowel or bladder habits. The patient denies fever, rash, bleeding, unexplained fatigue or unexplained weight loss. A detailed review of systems was otherwise entirely negative.   COVID 19 VACCINATION STATUS: fully vaccinated [employer]), with booster 08/2019   PAST MEDICAL HISTORY: Past Medical History:  Diagnosis Date  . Breast cancer ([place]) 3 /22  . Hypertension 2019  . SVT (supraventricular tachycardia) ([place]) 07/2013   strees test- normal    PAST SURGICAL HISTORY: Past Surgical History:  Procedure Laterality Date  . appendectomy    . APPENDECTOMY  1921  . AUGMENTATION MAMMAPLASTY Bilateral   . EYE SURGERY  2022    FAMILY HISTORY: Family History  Problem Relation Age of Onset  . Hypertension Father   . Hyperlipidemia  Father   .  Heart attack Father   . Stroke Father   . Bladder Cancer Brother   . Liver cancer Sister   . Multiple myeloma Brother   Her parents both died at the age of 30, her father from CVA and her mother from Alzheimer's.  The patient has 6 brothers and 5 sisters. She reports multiple myeloma in a brother at age 44, bladder cancer in a brother at 68, and cholangiocarcinoma in a sister at age 13.   GYNECOLOGIC HISTORY:  No LMP recorded. Patient is postmenopausal. Menarche: 70 years old Age at first live birth: 70 years old [place] P 3 LMP 2004 Contraceptive never used HRT never used  Hysterectomy? no BSO? 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 She is widowed. She lives at home by herself, with her poodle Kimberly Klein. Daughter Kimberly Klein, age 34, is a midwife in [place]. Daughter Kimberly Klein, age 29, teaches early pre-school here in [place]. Kimberly Klein has 4 grandchildren. She is not a Designer, fashion/clothing.    ADVANCED DIRECTIVES: in place, daughters Kimberly Klein and Kimberly Klein are her HCPOA   HEALTH MAINTENANCE: Social History   Tobacco Use  . Smoking status: Former Smoker    Packs/day: 0.00    Years: 20.00    Pack years: 0.00    Types: Cigarettes    Quit date: 10/03/2007    Years since quitting: 12.5  . Smokeless tobacco: Never Used  Vaping Use  . Vaping Use: Never used  Substance Use Topics  . Alcohol use: Yes    Alcohol/week: 4.0 - 5.0 standard drinks    Types: 4 - 5 Glasses of wine per week    Comment: occasional  . Drug use: No     Colonoscopy: 2016  PAP: 2010  Bone density: 2010   No Known Allergies  Current Outpatient Medications  Medication Sig Dispense Refill  . amLODipine (NORVASC) 5 MG tablet TAKE 1 TABLET(5 MG) BY MOUTH DAILY 90 tablet 1  . lisinopril (ZESTRIL) 20 MG tablet TAKE 1 TABLET(20 MG) BY MOUTH DAILY 90 tablet 1  . Multiple Vitamins-Minerals (PRESERVISION AREDS 2+MULTI VIT PO) Take by mouth.    . valACYclovir (VALTREX)  1000 MG tablet Take 1 tablet (1,000 mg total) by mouth 2 (two) times daily. 20 tablet 6  . ALPRAZolam (XANAX) 0.5 MG tablet Take 1 tablet (0.5 mg total) by mouth 3 (three) times daily as needed for anxiety. Take prior to dental surgery 5 tablet 0   No current facility-administered medications for this visit.    OBJECTIVE: White woman in no acute distress  Vitals:   04/04/20 1256  BP: (!) 156/78  Pulse: 84  Resp: 18  Temp: 97.7 F (36.5 C)  SpO2: 99%     Body mass index is 32.23 kg/m.   Wt Readings from Last 3 Encounters:  04/04/20 176 lb 3.2 oz (79.9 kg)  06/10/19 178 lb (80.7 kg)  04/27/19 160 lb (72.6 kg)      ECOG FS:1 - Symptomatic but completely ambulatory  Ocular: Sclerae unicteric, pupils round and equal Ear-nose-throat: Wearing a mask Lymphatic: No cervical or supraclavicular adenopathy Lungs no rales or rhonchi Heart regular rate and rhythm Abd soft, nontender, positive bowel sounds MSK no focal spinal tenderness, no joint edema Neuro: non-focal, well-oriented, appropriate affect Breasts: Other right breast is status post recent biopsy.  There is a large ecchymosis.  The left breast is also status post recent biopsy.  There is a small ecchymosis.  Both  axillae are benign.   LAB RESULTS:  CMP     Component Value Date/Time   NA 138 04/04/2020 1231   NA 140 03/05/2020 1458   NA 135 (L) 07/18/2013 1335   K 4.0 04/04/2020 1231   K 4.1 07/18/2013 1335   CL 101 04/04/2020 1231   CL 101 07/18/2013 1335   CO2 25 04/04/2020 1231   CO2 28 07/18/2013 1335   GLUCOSE 114 (H) 04/04/2020 1231   GLUCOSE 105 (H) 07/18/2013 1335   BUN 7 (L) 04/04/2020 1231   BUN 12 03/05/2020 1458   BUN 7 07/18/2013 1335   CREATININE 0.81 04/04/2020 1231   CREATININE 0.94 07/18/2013 1335   CALCIUM 9.0 04/04/2020 1231   CALCIUM 9.3 07/18/2013 1335   PROT 6.9 04/04/2020 1231   PROT 7.0 04/04/2019 1122   PROT 7.5 07/18/2013 1335   ALBUMIN 4.1 04/04/2020 1231   ALBUMIN 4.3 04/04/2019  1122   ALBUMIN 3.7 07/18/2013 1335   AST 36 04/04/2020 1231   ALT 46 (H) 04/04/2020 1231   ALT 34 07/18/2013 1335   ALKPHOS 80 04/04/2020 1231   ALKPHOS 79 07/18/2013 1335   BILITOT 0.6 04/04/2020 1231   GFRNONAA >60 04/04/2020 1231   GFRNONAA >60 07/18/2013 1335   GFRAA 80 03/05/2020 1458   GFRAA >60 07/18/2013 1335    No results found for: TOTALPROTELP, ALBUMINELP, A1GS, A2GS, BETS, BETA2SER, GAMS, MSPIKE, SPEI  Lab Results  Component Value Date   WBC 6.3 04/04/2020   NEUTROABS 3.0 04/04/2020   HGB 12.2 04/04/2020   HCT 36.5 04/04/2020   MCV 93.6 04/04/2020   PLT 211 04/04/2020    No results found for: LABCA2  No components found for: [id number]  No results for input(s): INR in the last 168 hours.  No results found for: LABCA2  No results found for: GDJ242  No results found for: AST419  No results found for: QQI297  No results found for: CA2729  No components found for: HGQUANT  No results found for: CEA1 / No results found for: CEA1   No results found for: AFPTUMOR  No results found for: CHROMOGRNA  No results found for: KPAFRELGTCHN, LAMBDASER, KAPLAMBRATIO (kappa/lambda light chains)  No results found for: HGBA, HGBA2QUANT, HGBFQUANT, HGBSQUAN (Hemoglobinopathy evaluation)   No results found for: LDH  No results found for: IRON, TIBC, IRONPCTSAT (Iron and TIBC)  No results found for: FERRITIN  Urinalysis    Component Value Date/Time   APPEARANCEUR Clear 09/27/2018 1504   GLUCOSEU Negative 09/27/2018 1504   BILIRUBINUR Negative 09/27/2018 1504   PROTEINUR Negative 09/27/2018 1504   NITRITE Negative 09/27/2018 1504   LEUKOCYTESUR Negative 09/27/2018 1504     STUDIES: US BREAST LTD UNI LEFT INC AXILLA  Addendum Date: 04/02/2020   ADDENDUM REPORT: 04/02/2020 07:55 ADDENDUM: In the FINDINGS section, the statement should read within the LOWER OUTER RIGHT breast there pleomorphic calcifications spanning a distance of at least 5 cm. (Instead  of the LEFT breast). Electronically Signed   By: Margarette Canada M.D.   On: 04/02/2020 07:55   Result Date: 04/02/2020 CLINICAL DATA:  70 year old female for further evaluation of RIGHT breast calcifications and possible LEFT breast mass on screening mammogram. EXAM: DIGITAL DIAGNOSTIC BILATERAL MAMMOGRAM WITH TOMOSYNTHESIS AND CAD; ULTRASOUND RIGHT BREAST LIMITED; ULTRASOUND LEFT BREAST LIMITED TECHNIQUE: Bilateral digital diagnostic mammography and breast tomosynthesis was performed. The images were evaluated with computer-aided detection.; Targeted ultrasound examination of the right breast was performed; Targeted ultrasound examination of the left breast was performed  COMPARISON:  Previous exam(s). ACR Breast Density Category b: There are scattered areas of fibroglandular density. FINDINGS: Full field and magnification views of the RIGHT breast and spot compression views of the LEFT breast are performed. Within the LOWER OUTER LEFT breast there are pleomorphic calcifications spanning a distance of at least 5 cm. The posterior extent of these calcifications may be off the field of view due to positioning from the implant. Possible increased density in the area of these calcifications is identified. A faint persistent circumscribed oval mass within the UPPER LEFT breast is noted. Targeted ultrasound is performed, showing the following: RIGHT breast: No sonographic abnormality within the OUTER RIGHT breast. No abnormal RIGHT axillary lymph nodes are identified. LEFT breast: A 0.7 x 0.4 x 1 cm circumscribed oval hypoechoic mass with internal vascular flow is noted at the 12 o'clock position 2 cm from the nipple. No abnormal LEFT axillary lymph nodes are identified. IMPRESSION: 1. Suspicious LOWER OUTER RIGHT breast calcifications spanning a distance of at least 5 cm. Tissue sampling of the anterior and posterior aspects (if technically feasible due to implant) recommended. 2. Indeterminate 1 cm UPPER LEFT breast mass.  Tissue sampling is recommended given RIGHT breast findings. 3. No abnormal appearing axillary lymph nodes. RECOMMENDATION: 1. Stereotactic guided biopsies of anterior and posterior aspects of LOWER OUTER RIGHT breast calcifications (the posterior aspect may be technically difficult to biopsy given implant). 2. Ultrasound-guided LEFT breast biopsy. These biopsies have been scheduled. I have discussed the findings and recommendations with the patient. If applicable, a reminder letter will be sent to the patient regarding the next appointment. BI-RADS CATEGORY  4: Suspicious. Electronically Signed: By: Margarette Canada M.D. On: 03/20/2020 10:50   US BREAST LTD UNI RIGHT INC AXILLA  Addendum Date: 04/02/2020   ADDENDUM REPORT: 04/02/2020 07:55 ADDENDUM: In the FINDINGS section, the statement should read within the LOWER OUTER RIGHT breast there pleomorphic calcifications spanning a distance of at least 5 cm. (Instead of the LEFT breast). Electronically Signed   By: Margarette Canada M.D.   On: 04/02/2020 07:55   Result Date: 04/02/2020 CLINICAL DATA:  70 year old female for further evaluation of RIGHT breast calcifications and possible LEFT breast mass on screening mammogram. EXAM: DIGITAL DIAGNOSTIC BILATERAL MAMMOGRAM WITH TOMOSYNTHESIS AND CAD; ULTRASOUND RIGHT BREAST LIMITED; ULTRASOUND LEFT BREAST LIMITED TECHNIQUE: Bilateral digital diagnostic mammography and breast tomosynthesis was performed. The images were evaluated with computer-aided detection.; Targeted ultrasound examination of the right breast was performed; Targeted ultrasound examination of the left breast was performed COMPARISON:  Previous exam(s). ACR Breast Density Category b: There are scattered areas of fibroglandular density. FINDINGS: Full field and magnification views of the RIGHT breast and spot compression views of the LEFT breast are performed. Within the LOWER OUTER LEFT breast there are pleomorphic calcifications spanning a distance of at least  5 cm. The posterior extent of these calcifications may be off the field of view due to positioning from the implant. Possible increased density in the area of these calcifications is identified. A faint persistent circumscribed oval mass within the UPPER LEFT breast is noted. Targeted ultrasound is performed, showing the following: RIGHT breast: No sonographic abnormality within the OUTER RIGHT breast. No abnormal RIGHT axillary lymph nodes are identified. LEFT breast: A 0.7 x 0.4 x 1 cm circumscribed oval hypoechoic mass with internal vascular flow is noted at the 12 o'clock position 2 cm from the nipple. No abnormal LEFT axillary lymph nodes are identified. IMPRESSION: 1. Suspicious LOWER OUTER RIGHT breast calcifications  spanning a distance of at least 5 cm. Tissue sampling of the anterior and posterior aspects (if technically feasible due to implant) recommended. 2. Indeterminate 1 cm UPPER LEFT breast mass. Tissue sampling is recommended given RIGHT breast findings. 3. No abnormal appearing axillary lymph nodes. RECOMMENDATION: 1. Stereotactic guided biopsies of anterior and posterior aspects of LOWER OUTER RIGHT breast calcifications (the posterior aspect may be technically difficult to biopsy given implant). 2. Ultrasound-guided LEFT breast biopsy. These biopsies have been scheduled. I have discussed the findings and recommendations with the patient. If applicable, a reminder letter will be sent to the patient regarding the next appointment. BI-RADS CATEGORY  4: Suspicious. Electronically Signed: By: Margarette Canada M.D. On: 03/20/2020 10:50   MM DIAG BREAST TOMO BILATERAL  Addendum Date: 04/02/2020   ADDENDUM REPORT: 04/02/2020 07:55 ADDENDUM: In the FINDINGS section, the statement should read within the LOWER OUTER RIGHT breast there pleomorphic calcifications spanning a distance of at least 5 cm. (Instead of the LEFT breast). Electronically Signed   By: Margarette Canada M.D.   On: 04/02/2020 07:55   Result  Date: 04/02/2020 CLINICAL DATA:  70 year old female for further evaluation of RIGHT breast calcifications and possible LEFT breast mass on screening mammogram. EXAM: DIGITAL DIAGNOSTIC BILATERAL MAMMOGRAM WITH TOMOSYNTHESIS AND CAD; ULTRASOUND RIGHT BREAST LIMITED; ULTRASOUND LEFT BREAST LIMITED TECHNIQUE: Bilateral digital diagnostic mammography and breast tomosynthesis was performed. The images were evaluated with computer-aided detection.; Targeted ultrasound examination of the right breast was performed; Targeted ultrasound examination of the left breast was performed COMPARISON:  Previous exam(s). ACR Breast Density Category b: There are scattered areas of fibroglandular density. FINDINGS: Full field and magnification views of the RIGHT breast and spot compression views of the LEFT breast are performed. Within the LOWER OUTER LEFT breast there are pleomorphic calcifications spanning a distance of at least 5 cm. The posterior extent of these calcifications may be off the field of view due to positioning from the implant. Possible increased density in the area of these calcifications is identified. A faint persistent circumscribed oval mass within the UPPER LEFT breast is noted. Targeted ultrasound is performed, showing the following: RIGHT breast: No sonographic abnormality within the OUTER RIGHT breast. No abnormal RIGHT axillary lymph nodes are identified. LEFT breast: A 0.7 x 0.4 x 1 cm circumscribed oval hypoechoic mass with internal vascular flow is noted at the 12 o'clock position 2 cm from the nipple. No abnormal LEFT axillary lymph nodes are identified. IMPRESSION: 1. Suspicious LOWER OUTER RIGHT breast calcifications spanning a distance of at least 5 cm. Tissue sampling of the anterior and posterior aspects (if technically feasible due to implant) recommended. 2. Indeterminate 1 cm UPPER LEFT breast mass. Tissue sampling is recommended given RIGHT breast findings. 3. No abnormal appearing axillary lymph  nodes. RECOMMENDATION: 1. Stereotactic guided biopsies of anterior and posterior aspects of LOWER OUTER RIGHT breast calcifications (the posterior aspect may be technically difficult to biopsy given implant). 2. Ultrasound-guided LEFT breast biopsy. These biopsies have been scheduled. I have discussed the findings and recommendations with the patient. If applicable, a reminder letter will be sent to the patient regarding the next appointment. BI-RADS CATEGORY  4: Suspicious. Electronically Signed: By: Margarette Canada M.D. On: 03/20/2020 10:50   MM CLIP PLACEMENT LEFT  Result Date: 03/28/2020 CLINICAL DATA:  Status post ultrasound-guided core biopsy of a left breast mass and two stereotactic biopsies of right breast calcifications. EXAM: 3D DIAGNOSTIC BILATERAL MAMMOGRAM POST ULTRASOUND BIOPSY OF THE LEFT BREAST AND STEREOTACTIC BIOPSY  OF THE RIGHT BREAST X2 COMPARISON:  Previous exam(s). FINDINGS: 3D Mammographic images were obtained following ultrasound guided biopsy of the left breast and stereotactic biopsy of the right breast x2. The ribbon shaped biopsy marker clip in the 12 o'clock region of the left breast is in appropriate position. The coil shaped biopsy marker clip in the posterior aspect of the lower outer quadrant of the right breast and the X shaped clip in the anterior aspect of the lower outer quadrant of the right breast are in appropriate position. IMPRESSION: Appropriate positioning of the ribbon shaped shaped biopsy marking clip at the site of biopsy in the 12 o'clock region of the left breast and coil shaped clip in the posterior aspect of the lower outer quadrant of the right breast and X shaped clip in the anterior aspect of the lower outer quadrant of the right breast. The patient developed a moderate size hematoma from the stereotactic biopsy of the anterior aspect of the lower outer quadrant of the right breast. Final Assessment: Post Procedure Mammograms for Marker Placement Electronically  Signed   By: Lillia Mountain M.D.   On: 03/28/2020 10:13   MM CLIP PLACEMENT RIGHT  Result Date: 03/28/2020 CLINICAL DATA:  Status post ultrasound-guided core biopsy of a left breast mass and two stereotactic biopsies of right breast calcifications. EXAM: 3D DIAGNOSTIC BILATERAL MAMMOGRAM POST ULTRASOUND BIOPSY OF THE LEFT BREAST AND STEREOTACTIC BIOPSY OF THE RIGHT BREAST X2 COMPARISON:  Previous exam(s). FINDINGS: 3D Mammographic images were obtained following ultrasound guided biopsy of the left breast and stereotactic biopsy of the right breast x2. The ribbon shaped biopsy marker clip in the 12 o'clock region of the left breast is in appropriate position. The coil shaped biopsy marker clip in the posterior aspect of the lower outer quadrant of the right breast and the X shaped clip in the anterior aspect of the lower outer quadrant of the right breast are in appropriate position. IMPRESSION: Appropriate positioning of the ribbon shaped shaped biopsy marking clip at the site of biopsy in the 12 o'clock region of the left breast and coil shaped clip in the posterior aspect of the lower outer quadrant of the right breast and X shaped clip in the anterior aspect of the lower outer quadrant of the right breast. The patient developed a moderate size hematoma from the stereotactic biopsy of the anterior aspect of the lower outer quadrant of the right breast. Final Assessment: Post Procedure Mammograms for Marker Placement Electronically Signed   By: Lillia Mountain M.D.   On: 03/28/2020 10:13   [place] LT BREAST BX W LOC DEV 1ST LESION IMG BX SPEC US GUIDE  Addendum Date: 04/02/2020   ADDENDUM REPORT: 03/29/2020 12:10 ADDENDUM: Pathology revealed FIBROADENOMA of the LEFT breast, 12:00 o'clock. This was found to be concordant by Dr. Lillia Mountain. 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 This was found to be  concordant by Dr. Lillia Mountain. 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 This was found to be concordant by Dr. Lillia Mountain. Pathology results were discussed with the patient by telephone. The patient reported doing well after the biopsies with tenderness at the sites. Post biopsy instructions and care were reviewed and questions were answered. The patient was encouraged to call [facility]  for any additional concerns. My direct phone number was provided. The patient was referred to [facility] on April 04, 2020. Pathology results reported by Terie Purser, RN on 03/29/2020. Electronically Signed   By: Lillia Mountain M.D.   On: 03/29/2020 12:10   Result Date: 04/02/2020 CLINICAL DATA:  Left breast mass and right breast calcifications. EXAM: ULTRASOUND GUIDED LEFT BREAST CORE NEEDLE BIOPSY AND STEREOTACTIC GUIDED RIGHT BREAST CORE NEEDLE BIOPSIES X2 COMPARISON:  Previous exam(s). PROCEDURE: I met with the patient and we discussed the procedure of ultrasound-guided biopsy, including benefits and alternatives. We discussed the high likelihood of a successful procedure. We discussed the risks of the procedure, including infection, bleeding, tissue injury, clip migration, and inadequate sampling. Informed written consent was given. The usual time-out protocol was performed immediately prior to the procedure. Lesion quadrant: 12 o'clock Using sterile technique and 1% Lidocaine as local anesthetic, under direct ultrasound visualization, a 14 gauge spring-loaded device was used to perform biopsy of a mass in the 12 o'clock region of the left breast using a lateral to medial approach. At the conclusion of the procedure ribbon shaped tissue marker clip was deployed  into the biopsy cavity. Follow up 2 view mammogram was performed and dictated separately. The patient and I discussed the procedure of stereotactic-guided biopsy including benefits and alternatives. We discussed the high likelihood of a successful procedure. We discussed the risks of the procedure including infection, bleeding, tissue injury, clip migration, and inadequate sampling. Informed written consent was given. The usual time out protocol was performed immediately prior to the procedure. Using sterile technique and 1% lidocaine and 1% lidocaine with epinephrine as local anesthetic, under stereotactic guidance, a 9 gauge vacuum assisted device was used to perform core needle biopsy of calcifications in the lower outer quadrant of the right breast (posterior) using a lateral to medial approach. Specimen radiograph was performed showing calcifications are present in the tissue samples. Specimens with calcifications are identified for pathology. Lesion quadrant: LOWER OUTER QUADRANT (POSTERIOR EXTENT) At the conclusion of the procedure, coil shaped tissue marker clip was deployed into the biopsy cavity. Follow-up 2-view mammogram was performed and dictated separately. The patient and I discussed the procedure of stereotactic-guided biopsy including benefits and alternatives. We discussed the high likelihood of a successful procedure. We discussed the risks of the procedure including infection, bleeding, tissue injury, clip migration, and inadequate sampling. Informed written consent was given. The usual time out protocol was performed immediately prior to the procedure. Using sterile technique and 1% lidocaine and 1% lidocaine with epinephrine as local anesthetic, under stereotactic guidance, a 9 gauge vacuum assisted device was used to perform core needle biopsy of calcifications in the lower outer quadrant of the right breast (anterior) using a lateral to medial approach. Specimen radiograph was performed  showing calcifications are present in the tissue samples. Specimens with calcifications are identified for pathology. Lesion quadrant: LOWER OUTER QUADRANT (ANTERIOR EXTENT) At the conclusion of the procedure, X shaped tissue marker clip was deployed into the biopsy cavity. Follow-up 2-view mammogram was performed and dictated separately. IMPRESSION: Ultrasound guided biopsy of a mass in the 12 o'clock region of the right breast. Stereotactic biopsy calcifications in the posterior and anterior extent of the lower outer quadrant of the right breast. No apparent complications. Electronically Signed: By: Lillia Mountain M.D. On: 03/28/2020 09:26   MM RT BREAST BX W LOC DEV 1ST LESION IMAGE BX SPEC STEREO GUIDE  Addendum Date: 04/02/2020   ADDENDUM REPORT: 03/29/2020 12:10 ADDENDUM: Pathology revealed FIBROADENOMA of the LEFT breast, 12:00 o'clock. This was found to be concordant by Dr. Lillia Mountain. 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 This was found to be concordant by Dr. Lillia Mountain. 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 This was found to be concordant by Dr. Lillia Mountain. Pathology results were discussed with the patient by telephone. The patient reported doing well after the biopsies with tenderness at the sites. Post biopsy instructions and care were reviewed and questions were answered. The patient was encouraged to call [facility] for any additional concerns. My direct phone number was provided. The patient was referred to [facility] on April 04, 2020. Pathology results reported by Terie Purser, RN on 03/29/2020. Electronically  Signed   By: Lillia Mountain M.D.   On: 03/29/2020 12:10   Result Date: 04/02/2020 CLINICAL DATA:  Left breast mass and right breast calcifications. EXAM: ULTRASOUND GUIDED LEFT BREAST CORE NEEDLE BIOPSY AND STEREOTACTIC GUIDED RIGHT BREAST CORE NEEDLE BIOPSIES X2 COMPARISON:  Previous exam(s). PROCEDURE: I met with the patient and we discussed the procedure of ultrasound-guided biopsy, including benefits and alternatives. We discussed the high likelihood of a successful procedure. We discussed the risks of the procedure, including infection, bleeding, tissue injury, clip migration, and inadequate sampling. Informed written consent was given. The usual time-out protocol was performed immediately prior to the procedure. Lesion quadrant: 12 o'clock Using sterile technique and 1% Lidocaine as local anesthetic, under direct ultrasound visualization, a 14 gauge spring-loaded device was used to perform biopsy of a mass in the 12 o'clock region of the left breast using a lateral to medial approach. At the conclusion of the procedure ribbon shaped tissue marker clip was deployed into the biopsy cavity. Follow up 2 view mammogram was performed and dictated separately. The patient and I discussed the procedure of stereotactic-guided biopsy including benefits and alternatives. We discussed the high likelihood of a successful procedure. We discussed the risks of the procedure including infection, bleeding, tissue injury, clip migration, and inadequate sampling. Informed written consent was given. The usual time out protocol was performed immediately prior to the procedure. Using sterile technique and 1% lidocaine and 1% lidocaine with epinephrine as local anesthetic, under stereotactic guidance, a 9 gauge vacuum assisted device was used to perform core needle biopsy of calcifications in the lower outer quadrant of the right breast (posterior) using a lateral to medial approach. Specimen radiograph was performed showing  calcifications are present in the tissue samples. Specimens with calcifications are identified for pathology. Lesion quadrant: LOWER OUTER QUADRANT (POSTERIOR EXTENT) At the conclusion of the procedure, coil shaped tissue marker clip was deployed into the biopsy cavity. Follow-up 2-view mammogram was performed and dictated separately. The patient and I discussed the procedure of stereotactic-guided biopsy including benefits and alternatives. We discussed the high likelihood of a successful procedure. We discussed the risks of the procedure including infection, bleeding, tissue injury, clip migration, and inadequate sampling. Informed written consent was given. The usual time out protocol was performed immediately prior to the procedure. Using sterile technique and 1% lidocaine and 1% lidocaine with epinephrine as local anesthetic, under stereotactic guidance, a 9 gauge  vacuum assisted device was used to perform core needle biopsy of calcifications in the lower outer quadrant of the right breast (anterior) using a lateral to medial approach. Specimen radiograph was performed showing calcifications are present in the tissue samples. Specimens with calcifications are identified for pathology. Lesion quadrant: LOWER OUTER QUADRANT (ANTERIOR EXTENT) At the conclusion of the procedure, X shaped tissue marker clip was deployed into the biopsy cavity. Follow-up 2-view mammogram was performed and dictated separately. IMPRESSION: Ultrasound guided biopsy of a mass in the 12 o'clock region of the right breast. Stereotactic biopsy calcifications in the posterior and anterior extent of the lower outer quadrant of the right breast. No apparent complications. Electronically Signed: By: Lillia Mountain M.D. On: 03/28/2020 09:26   MM RT BREAST BX W LOC DEV EA AD LESION IMG BX SPEC STEREO GUIDE  Addendum Date: 04/02/2020   ADDENDUM REPORT: 03/29/2020 12:10 ADDENDUM: Pathology revealed FIBROADENOMA of the LEFT breast, 12:00 o'clock.  This was found to be concordant by Dr. Lillia Mountain. 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 This was found to be concordant by Dr. Lillia Mountain. 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 This was found to be concordant by Dr. Lillia Mountain. Pathology results were discussed with the patient by telephone. The patient reported doing well after the biopsies with tenderness at the sites. Post biopsy instructions and care were reviewed and questions were answered. The patient was encouraged to call [facility] for any additional concerns. My direct phone number was provided. The patient was referred to [facility] at [facility] on April 04, 2020. Pathology results reported by Terie Purser, RN on 03/29/2020. Electronically Signed   By: Lillia Mountain M.D.   On: 03/29/2020 12:10   Result Date: 04/02/2020 CLINICAL DATA:  Left breast mass and right breast calcifications. EXAM: ULTRASOUND GUIDED LEFT BREAST CORE NEEDLE BIOPSY AND STEREOTACTIC GUIDED RIGHT BREAST CORE NEEDLE BIOPSIES X2 COMPARISON:  Previous exam(s). PROCEDURE: I met with the patient and we discussed the procedure of ultrasound-guided biopsy, including benefits and alternatives. We discussed the high likelihood of a successful procedure. We discussed the risks of the procedure, including infection, bleeding, tissue injury, clip migration, and inadequate sampling. Informed written consent was given. The usual time-out protocol was performed immediately prior to the procedure. Lesion quadrant: 12 o'clock Using sterile technique and 1% Lidocaine as local anesthetic, under direct ultrasound visualization,  a 14 gauge spring-loaded device was used to perform biopsy of a mass in the 12 o'clock region of the left breast using a lateral to medial approach. At the conclusion of the procedure ribbon shaped tissue marker clip was deployed into the biopsy cavity. Follow up 2 view mammogram was performed and dictated separately. The patient and I discussed the procedure of stereotactic-guided biopsy including benefits and alternatives. We discussed the high likelihood of a successful procedure. We discussed the risks of the procedure including infection, bleeding, tissue injury, clip migration, and inadequate sampling. Informed written consent was given. The usual time out protocol was performed immediately prior to the procedure. Using sterile technique and 1% lidocaine and 1% lidocaine with epinephrine as local anesthetic, under stereotactic guidance, a 9 gauge vacuum assisted device was used to perform core needle biopsy of calcifications  in the lower outer quadrant of the right breast (posterior) using a lateral to medial approach. Specimen radiograph was performed showing calcifications are present in the tissue samples. Specimens with calcifications are identified for pathology. Lesion quadrant: LOWER OUTER QUADRANT (POSTERIOR EXTENT) At the conclusion of the procedure, coil shaped tissue marker clip was deployed into the biopsy cavity. Follow-up 2-view mammogram was performed and dictated separately. The patient and I discussed the procedure of stereotactic-guided biopsy including benefits and alternatives. We discussed the high likelihood of a successful procedure. We discussed the risks of the procedure including infection, bleeding, tissue injury, clip migration, and inadequate sampling. Informed written consent was given. The usual time out protocol was performed immediately prior to the procedure. Using sterile technique and 1% lidocaine and 1% lidocaine with epinephrine as local anesthetic, under stereotactic  guidance, a 9 gauge vacuum assisted device was used to perform core needle biopsy of calcifications in the lower outer quadrant of the right breast (anterior) using a lateral to medial approach. Specimen radiograph was performed showing calcifications are present in the tissue samples. Specimens with calcifications are identified for pathology. Lesion quadrant: LOWER OUTER QUADRANT (ANTERIOR EXTENT) At the conclusion of the procedure, X shaped tissue marker clip was deployed into the biopsy cavity. Follow-up 2-view mammogram was performed and dictated separately. IMPRESSION: Ultrasound guided biopsy of a mass in the 12 o'clock region of the right breast. Stereotactic biopsy calcifications in the posterior and anterior extent of the lower outer quadrant of the right breast. No apparent complications. Electronically Signed: By: Lillia Mountain M.D. On: 03/28/2020 09:26     ELIGIBLE FOR AVAILABLE RESEARCH PROTOCOL: No  ASSESSMENT: 70 y.o. 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 Specifically we discussed the biology of her breast cancer, its diagnosis, staging, treatment  options and prognosis. Lorryunderstands that in noninvasive ductal carcinoma, also called ductal carcinoma in situ ("DCIS") the breast cancer cells remain trapped in the ducts were they started. They cannot travel to a vital organ. For that reason these cancers in themselves are not life-threatening.  If the whole breast is removed then all the ducts are removed and since the cancer cells are trapped in the ducts, the cure rate with mastectomy for noninvasive breast cancer is approximately 99%.  Nevertheless if feasible we recommend lumpectomy, because there is no survival advantage to mastectomy and because the cosmetic result is generally superior with breast conservation.  If the patient does keep her breasts, there will be some risk of recurrence. The recurrence can only be in the same breast since, again, the cells are trapped in the ducts. There is no connection from one breast to the other. The risk of local recurrence is cut by more than half with radiation, which is standard in this situation.  In estrogen receptor positive cancers anti-estrogens can also be considered. T that is not the case here.  However anti-estrogens will lower the risk of a new breast cancer developing in either breast, also by one half. That risk otherwise approaches 1% per year.   Accordingly the overall plan is for surgery, followed by radiation, then a discussion of anti-estrogens.  Kimberly Klein has a good understanding of the overall  plan. She agrees with it. She knows the goal of treatment in her case is cure.  I have given her information on tamoxifen and anastrozole that she may study at home and I will do a virtual visit with her in about 12 weeks in case she is interested in pursuing the prophylactic antiestrogen option.  Total encounter time 55 minutes.Sarajane Jews C. , MD 04/04/2020 3:24 PM Medical Oncology and Hematology [facility] [place], [place] [address] Tel. [phone]    Fax. [phone]   This document serves as a record of services personally performed by Lurline Del, MD. It was created on his behalf by Wilburn Mylar, a trained medical scribe. The creation of this record is based on the scribe's personal observations and the provider's statements to them.   I, Lurline Del MD, have reviewed the above documentation for accuracy and completeness, and I agree with the above.    *Total Encounter Time as defined by the Centers for Medicare and  Medicaid Services includes, in addition to the face-to-face time of a patient visit (documented in the note above) non-face-to-face time: obtaining and reviewing outside history, ordering and reviewing medications, tests or procedures, care coordination (communications with other health care professionals or caregivers) and documentation in the medical record.

## 2020-04-04 ENCOUNTER — Inpatient Hospital Stay: Payer: PPO

## 2020-04-04 ENCOUNTER — Other Ambulatory Visit: Payer: Self-pay | Admitting: General Surgery

## 2020-04-04 ENCOUNTER — Ambulatory Visit: Payer: PPO | Admitting: Physical Therapy

## 2020-04-04 ENCOUNTER — Other Ambulatory Visit: Payer: Self-pay

## 2020-04-04 ENCOUNTER — Encounter: Payer: Self-pay | Admitting: *Deleted

## 2020-04-04 ENCOUNTER — Ambulatory Visit
Admission: RE | Admit: 2020-04-04 | Discharge: 2020-04-04 | Disposition: A | Discharge status: Home / Self Care | Payer: PPO | Source: Ambulatory Visit | Attending: Radiation Oncology | Admitting: Radiation Oncology

## 2020-04-04 ENCOUNTER — Inpatient Hospital Stay: Payer: PPO | Attending: Oncology | Admitting: Oncology

## 2020-04-04 VITALS — BP 156/78 | HR 84 | Temp 97.7°F | Resp 18 | Ht 62.0 in | Wt 176.2 lb

## 2020-04-04 DIAGNOSIS — D0511 Intraductal carcinoma in situ of right breast: Secondary | ICD-10-CM

## 2020-04-04 DIAGNOSIS — Z79899 Other long term (current) drug therapy: Secondary | ICD-10-CM | POA: Diagnosis not present

## 2020-04-04 DIAGNOSIS — I10 Essential (primary) hypertension: Secondary | ICD-10-CM | POA: Insufficient documentation

## 2020-04-04 DIAGNOSIS — Z8249 Family history of ischemic heart disease and other diseases of the circulatory system: Secondary | ICD-10-CM | POA: Insufficient documentation

## 2020-04-04 DIAGNOSIS — Z8052 Family history of malignant neoplasm of bladder: Secondary | ICD-10-CM | POA: Diagnosis not present

## 2020-04-04 DIAGNOSIS — C50511 Malignant neoplasm of lower-outer quadrant of right female breast: Secondary | ICD-10-CM | POA: Diagnosis not present

## 2020-04-04 DIAGNOSIS — Z807 Family history of other malignant neoplasms of lymphoid, hematopoietic and related tissues: Secondary | ICD-10-CM | POA: Diagnosis not present

## 2020-04-04 DIAGNOSIS — Z800 Family history of malignant neoplasm of digestive organs: Secondary | ICD-10-CM | POA: Diagnosis not present

## 2020-04-04 DIAGNOSIS — Z9882 Breast implant status: Secondary | ICD-10-CM | POA: Diagnosis not present

## 2020-04-04 DIAGNOSIS — Z87891 Personal history of nicotine dependence: Secondary | ICD-10-CM | POA: Diagnosis not present

## 2020-04-04 DIAGNOSIS — Z170 Estrogen receptor positive status [ER+]: Secondary | ICD-10-CM

## 2020-04-04 DIAGNOSIS — Z8349 Family history of other endocrine, nutritional and metabolic diseases: Secondary | ICD-10-CM | POA: Diagnosis not present

## 2020-04-04 LAB — CMP (CANCER CENTER ONLY)
ALT: 46 U/L — ABNORMAL HIGH (ref 0–44)
AST: 36 U/L (ref 15–41)
Albumin: 4.1 g/dL (ref 3.5–5.0)
Alkaline Phosphatase: 80 U/L (ref 38–126)
Anion gap: 12 (ref 5–15)
BUN: 7 mg/dL — ABNORMAL LOW (ref 8–23)
CO2: 25 mmol/L (ref 22–32)
Calcium: 9 mg/dL (ref 8.9–10.3)
Chloride: 101 mmol/L (ref 98–111)
Creatinine: 0.81 mg/dL (ref 0.44–1.00)
GFR, Estimated: >60 mL/min (ref >60)
Glucose, Bld: 114 mg/dL — ABNORMAL HIGH (ref 70–99)
Potassium: 4 mmol/L (ref 3.5–5.1)
Sodium: 138 mmol/L (ref 135–145)
Total Bilirubin: 0.6 mg/dL (ref 0.3–1.2)
Total Protein: 6.9 g/dL (ref 6.5–8.1)

## 2020-04-04 LAB — CBC WITH DIFFERENTIAL (CANCER CENTER ONLY)
Abs Immature Granulocytes: 0.07 10*3/uL (ref 0.00–0.07)
Basophils Absolute: 0 10*3/uL (ref 0.0–0.1)
Basophils Relative: 1 %
Eosinophils Absolute: 0.1 10*3/uL (ref 0.0–0.5)
Eosinophils Relative: 1 %
HCT: 36.5 % (ref 36.0–46.0)
Hemoglobin: 12.2 g/dL (ref 12.0–15.0)
Immature Granulocytes: 1 %
Lymphocytes Relative: 39 %
Lymphs Abs: 2.5 10*3/uL (ref 0.7–4.0)
MCH: 31.3 pg (ref 26.0–34.0)
MCHC: 33.4 g/dL (ref 30.0–36.0)
MCV: 93.6 fL (ref 80.0–100.0)
Monocytes Absolute: 0.7 10*3/uL (ref 0.1–1.0)
Monocytes Relative: 11 %
Neutro Abs: 3 10*3/uL (ref 1.7–7.7)
Neutrophils Relative %: 47 %
Platelet Count: 211 10*3/uL (ref 150–400)
RBC: 3.9 MIL/uL (ref 3.87–5.11)
RDW: 12.7 % (ref 11.5–15.5)
WBC Count: 6.3 10*3/uL (ref 4.0–10.5)
nRBC: 0 % (ref 0.0–0.2)

## 2020-04-04 LAB — GENETIC SCREENING ORDER

## 2020-04-04 NOTE — Progress Notes (Signed)
[facility] Psychosocial Distress Screening Counseling Intern  Counseling intern was referred by distress screening protocol.  The patient scored a 2 on the Psychosocial Distress Thermometer which indicates mild distress. Counseling intern met with patient in exam room" to assess for distress and other psychosocial needs. The patient attended clinic alone but works at the [employer] in [place], so she felt prepared. She reported good support from family and really had no concerns. Her distress after clinic is a 1 or lower.   ONCBCN DISTRESS SCREENING 04/04/2020  Screening Type Initial Screening  Distress experienced in past week (1-10) 2  Referral to support programs Yes    Follow up needed: No.  Gaylyn Rong Counseling Intern

## 2020-04-04 NOTE — Progress Notes (Signed)
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 Ductal carcinoma in situ (DCIS) of right breast  D05.11     HISTORY OF PRESENT ILLNESS::Kimberly Klein is a 70 y.o. female who is presenting to the office today for evaluation of her newly diagnosed breast cancer. She is doing well overall.   She had routine screening mammography on 02/24/2020 that showed possible microcalcifications in the right breast and a possible mass in the left breast. She underwent bilateral diagnostic mammography with tomography and bilateral breast ultrasonography at [facility] on 03/20/2020 that showed suspicious lower outer right breast calcifications spanning a distance of at least 5 cm. There was also noted to be an indeterminate 1 cm upper left breast mass. There were no abnormal appearing axillary lymph nodes.  Biopsy on 03/28/2020 showed: high-grade DCIS of the LOQ (posterior and anterior) right breast with necrosis and calcifications. Left breast was negative for malignancy. Prognostic indicators significant for: estrogen receptor, 20% with a weak staining intensity, and progesterone receptor, 0% negative.  GYNECOLOGIC HISTORY:  No LMP recorded. Patient is postmenopausal. Menarche: 70 years old Age at first live birth: 70 years old [place] P 3 LMP 2004 Contraceptive never used HRT never used Hysterectomy? no BSO? no  The patient was referred today for presentation in the multidisciplinary conference.  Radiology studies and pathology slides were presented there for review and  discussion of treatment options.  A consensus was discussed regarding potential next steps.  PREVIOUS RADIATION THERAPY: No  PAST MEDICAL HISTORY:  Past Medical History:  Diagnosis Date  . Breast cancer ([place]) 3 /22  . Hypertension 2019  . SVT (supraventricular tachycardia) ([place]) 07/2013   strees test- normal    PAST SURGICAL HISTORY: Past Surgical History:  Procedure Laterality Date  . appendectomy    . APPENDECTOMY  1921  . AUGMENTATION MAMMAPLASTY Bilateral   . EYE SURGERY  2022    FAMILY HISTORY:  Family History  Problem Relation Age of Onset  . Hypertension Father   . Hyperlipidemia Father   . Heart attack Father   . Stroke Father   . Bladder Cancer Brother   . Liver cancer Sister   . Multiple myeloma Brother     SOCIAL HISTORY:  Social History   Socioeconomic History  . Marital status: Widowed    Spouse name: Not on file  . Number of children: Not on file  . Years of education: Not on file  . Highest education level: Not on file  Occupational History  . Occupation: PRN RN   Tobacco Use  . Smoking status: Former Smoker    Packs/day: 0.00    Years: 20.00    Pack years: 0.00    Types: Cigarettes    Quit date: 10/03/2007    Years since quitting: 12.5  . Smokeless tobacco: Never Used  Vaping Use  . Vaping Use: Never used  Substance and Sexual Activity  . Alcohol use: Yes    Alcohol/week: 4.0 - 5.0 standard drinks    Types: 4 - 5 Glasses of wine per week  Comment: occasional  . Drug use: No  . Sexual activity: Not Currently    Birth control/protection: Post-menopausal  Other Topics Concern  . Not on file  Social History Narrative  . Not on file   Social Determinants of Health   Financial Resource Strain: Not on file  Food Insecurity: Not on file  Transportation Needs: Not on file  Physical Activity: Not on file  Stress: Not on file  Social Connections: Not on file    ALLERGIES: No Known Allergies  MEDICATIONS:  Current Outpatient  Medications  Medication Sig Dispense Refill  . ALPRAZolam (XANAX) 0.5 MG tablet Take 1 tablet (0.5 mg total) by mouth 3 (three) times daily as needed for anxiety. Take prior to dental surgery 5 tablet 0  . amLODipine (NORVASC) 5 MG tablet TAKE 1 TABLET(5 MG) BY MOUTH DAILY 90 tablet 1  . lisinopril (ZESTRIL) 20 MG tablet TAKE 1 TABLET(20 MG) BY MOUTH DAILY 90 tablet 1  . Multiple Vitamins-Minerals (PRESERVISION AREDS 2+MULTI VIT PO) Take by mouth.    . valACYclovir (VALTREX) 1000 MG tablet Take 1 tablet (1,000 mg total) by mouth 2 (two) times daily. 20 tablet 6   No current facility-administered medications for this encounter.    REVIEW OF SYSTEMS: A 10+ POINT REVIEW OF SYSTEMS WAS OBTAINED including neurology, dermatology, psychiatry, cardiac, respiratory, lymph, extremities, GI, GU, musculoskeletal, constitutional, reproductive, HEENT. On the provided form, she reports overall feeling well. She denies pain within either breast nipple discharge or bleeding and any other symptoms.    PHYSICAL EXAM:  Vitals:   04/04/20 1256  BP: (!) 156/78  Pulse: 84  Resp: 18  Temp: 97.7 F (36.5 C)  SpO2: 99%     Body mass index is 32.23 kg/m.    Lungs are clear to auscultation bilaterally. Heart has regular rate and rhythm. No palpable cervical, supraclavicular, or axillary adenopathy. Abdomen soft, non-tender, normal bowel sounds. Left breast with no palpable mass, nipple discharge, or bleeding.  Extensive bruising noted from biopsy Right breast with no palpable mass nipple discharge or bleeding extensive bruising noted from biopsy.   KPS = 100  100 - Normal; no complaints; no evidence of disease. 90   - Able to carry on normal activity; minor signs or symptoms of disease. 80   - Normal activity with effort; some signs or symptoms of disease. 1   - Cares for self; unable to carry on normal activity or to do active work. 60   - Requires occasional assistance, but is able to care for most of his  personal needs. 50   - Requires considerable assistance and frequent medical care. 4   - Disabled; requires special care and assistance. 63   - Severely disabled; hospital admission is indicated although death not imminent. 35   - Very sick; hospital admission necessary; active supportive treatment necessary. 10   - Moribund; fatal processes progressing rapidly. 0     - Dead  Karnofsky DA, Abelmann [place], Craver LS and Burchenal [facility] [phone]) The use of the nitrogen mustards in the palliative treatment of carcinoma: with particular reference to bronchogenic carcinoma Cancer 1 [id number]  LABORATORY DATA:  Lab Results  Component Value Date   WBC 6.3 04/04/2020   HGB 12.2 04/04/2020   HCT 36.5 04/04/2020   MCV 93.6 04/04/2020   PLT 211 04/04/2020   Lab Results  Component Value Date   NA 138 04/04/2020   K 4.0 04/04/2020   CL 101 04/04/2020   CO2 25 04/04/2020  Lab Results  Component Value Date   ALT 46 (H) 04/04/2020   AST 36 04/04/2020   ALKPHOS 80 04/04/2020   BILITOT 0.6 04/04/2020    PULMONARY FUNCTION TEST:   Recent Review Flowsheet Data   There is no flowsheet data to display.     RADIOGRAPHY: US BREAST LTD UNI LEFT INC AXILLA  Addendum Date: 04/02/2020   ADDENDUM REPORT: 04/02/2020 07:55 ADDENDUM: In the FINDINGS section, the statement should read within the LOWER OUTER RIGHT breast there pleomorphic calcifications spanning a distance of at least 5 cm. (Instead of the LEFT breast). Electronically Signed   By: Margarette Canada M.D.   On: 04/02/2020 07:55   Result Date: 04/02/2020 CLINICAL DATA:  70 year old female for further evaluation of RIGHT breast calcifications and possible LEFT breast mass on screening mammogram. EXAM: DIGITAL DIAGNOSTIC BILATERAL MAMMOGRAM WITH TOMOSYNTHESIS AND CAD; ULTRASOUND RIGHT BREAST LIMITED; ULTRASOUND LEFT BREAST LIMITED TECHNIQUE: Bilateral digital diagnostic mammography and breast tomosynthesis was performed. The images were evaluated with  computer-aided detection.; Targeted ultrasound examination of the right breast was performed; Targeted ultrasound examination of the left breast was performed COMPARISON:  Previous exam(s). ACR Breast Density Category b: There are scattered areas of fibroglandular density. FINDINGS: Full field and magnification views of the RIGHT breast and spot compression views of the LEFT breast are performed. Within the LOWER OUTER LEFT breast there are pleomorphic calcifications spanning a distance of at least 5 cm. The posterior extent of these calcifications may be off the field of view due to positioning from the implant. Possible increased density in the area of these calcifications is identified. A faint persistent circumscribed oval mass within the UPPER LEFT breast is noted. Targeted ultrasound is performed, showing the following: RIGHT breast: No sonographic abnormality within the OUTER RIGHT breast. No abnormal RIGHT axillary lymph nodes are identified. LEFT breast: A 0.7 x 0.4 x 1 cm circumscribed oval hypoechoic mass with internal vascular flow is noted at the 12 o'clock position 2 cm from the nipple. No abnormal LEFT axillary lymph nodes are identified. IMPRESSION: 1. Suspicious LOWER OUTER RIGHT breast calcifications spanning a distance of at least 5 cm. Tissue sampling of the anterior and posterior aspects (if technically feasible due to implant) recommended. 2. Indeterminate 1 cm UPPER LEFT breast mass. Tissue sampling is recommended given RIGHT breast findings. 3. No abnormal appearing axillary lymph nodes. RECOMMENDATION: 1. Stereotactic guided biopsies of anterior and posterior aspects of LOWER OUTER RIGHT breast calcifications (the posterior aspect may be technically difficult to biopsy given implant). 2. Ultrasound-guided LEFT breast biopsy. These biopsies have been scheduled. I have discussed the findings and recommendations with the patient. If applicable, a reminder letter will be sent to the patient  regarding the next appointment. BI-RADS CATEGORY  4: Suspicious. Electronically Signed: By: Margarette Canada M.D. On: 03/20/2020 10:50   US BREAST LTD UNI RIGHT INC AXILLA  Addendum Date: 04/02/2020   ADDENDUM REPORT: 04/02/2020 07:55 ADDENDUM: In the FINDINGS section, the statement should read within the LOWER OUTER RIGHT breast there pleomorphic calcifications spanning a distance of at least 5 cm. (Instead of the LEFT breast). Electronically Signed   By: Margarette Canada M.D.   On: 04/02/2020 07:55   Result Date: 04/02/2020 CLINICAL DATA:  70 year old female for further evaluation of RIGHT breast calcifications and possible LEFT breast mass on screening mammogram. EXAM: DIGITAL DIAGNOSTIC BILATERAL MAMMOGRAM WITH TOMOSYNTHESIS AND CAD; ULTRASOUND RIGHT BREAST LIMITED; ULTRASOUND LEFT BREAST LIMITED TECHNIQUE: Bilateral digital diagnostic mammography and breast tomosynthesis was performed.  The images were evaluated with computer-aided detection.; Targeted ultrasound examination of the right breast was performed; Targeted ultrasound examination of the left breast was performed COMPARISON:  Previous exam(s). ACR Breast Density Category b: There are scattered areas of fibroglandular density. FINDINGS: Full field and magnification views of the RIGHT breast and spot compression views of the LEFT breast are performed. Within the LOWER OUTER LEFT breast there are pleomorphic calcifications spanning a distance of at least 5 cm. The posterior extent of these calcifications may be off the field of view due to positioning from the implant. Possible increased density in the area of these calcifications is identified. A faint persistent circumscribed oval mass within the UPPER LEFT breast is noted. Targeted ultrasound is performed, showing the following: RIGHT breast: No sonographic abnormality within the OUTER RIGHT breast. No abnormal RIGHT axillary lymph nodes are identified. LEFT breast: A 0.7 x 0.4 x 1 cm circumscribed oval  hypoechoic mass with internal vascular flow is noted at the 12 o'clock position 2 cm from the nipple. No abnormal LEFT axillary lymph nodes are identified. IMPRESSION: 1. Suspicious LOWER OUTER RIGHT breast calcifications spanning a distance of at least 5 cm. Tissue sampling of the anterior and posterior aspects (if technically feasible due to implant) recommended. 2. Indeterminate 1 cm UPPER LEFT breast mass. Tissue sampling is recommended given RIGHT breast findings. 3. No abnormal appearing axillary lymph nodes. RECOMMENDATION: 1. Stereotactic guided biopsies of anterior and posterior aspects of LOWER OUTER RIGHT breast calcifications (the posterior aspect may be technically difficult to biopsy given implant). 2. Ultrasound-guided LEFT breast biopsy. These biopsies have been scheduled. I have discussed the findings and recommendations with the patient. If applicable, a reminder letter will be sent to the patient regarding the next appointment. BI-RADS CATEGORY  4: Suspicious. Electronically Signed: By: Margarette Canada M.D. On: 03/20/2020 10:50   MM DIAG BREAST TOMO BILATERAL  Addendum Date: 04/02/2020   ADDENDUM REPORT: 04/02/2020 07:55 ADDENDUM: In the FINDINGS section, the statement should read within the LOWER OUTER RIGHT breast there pleomorphic calcifications spanning a distance of at least 5 cm. (Instead of the LEFT breast). Electronically Signed   By: Margarette Canada M.D.   On: 04/02/2020 07:55   Result Date: 04/02/2020 CLINICAL DATA:  70 year old female for further evaluation of RIGHT breast calcifications and possible LEFT breast mass on screening mammogram. EXAM: DIGITAL DIAGNOSTIC BILATERAL MAMMOGRAM WITH TOMOSYNTHESIS AND CAD; ULTRASOUND RIGHT BREAST LIMITED; ULTRASOUND LEFT BREAST LIMITED TECHNIQUE: Bilateral digital diagnostic mammography and breast tomosynthesis was performed. The images were evaluated with computer-aided detection.; Targeted ultrasound examination of the right breast was performed;  Targeted ultrasound examination of the left breast was performed COMPARISON:  Previous exam(s). ACR Breast Density Category b: There are scattered areas of fibroglandular density. FINDINGS: Full field and magnification views of the RIGHT breast and spot compression views of the LEFT breast are performed. Within the LOWER OUTER LEFT breast there are pleomorphic calcifications spanning a distance of at least 5 cm. The posterior extent of these calcifications may be off the field of view due to positioning from the implant. Possible increased density in the area of these calcifications is identified. A faint persistent circumscribed oval mass within the UPPER LEFT breast is noted. Targeted ultrasound is performed, showing the following: RIGHT breast: No sonographic abnormality within the OUTER RIGHT breast. No abnormal RIGHT axillary lymph nodes are identified. LEFT breast: A 0.7 x 0.4 x 1 cm circumscribed oval hypoechoic mass with internal vascular flow is noted at the 12  o'clock position 2 cm from the nipple. No abnormal LEFT axillary lymph nodes are identified. IMPRESSION: 1. Suspicious LOWER OUTER RIGHT breast calcifications spanning a distance of at least 5 cm. Tissue sampling of the anterior and posterior aspects (if technically feasible due to implant) recommended. 2. Indeterminate 1 cm UPPER LEFT breast mass. Tissue sampling is recommended given RIGHT breast findings. 3. No abnormal appearing axillary lymph nodes. RECOMMENDATION: 1. Stereotactic guided biopsies of anterior and posterior aspects of LOWER OUTER RIGHT breast calcifications (the posterior aspect may be technically difficult to biopsy given implant). 2. Ultrasound-guided LEFT breast biopsy. These biopsies have been scheduled. I have discussed the findings and recommendations with the patient. If applicable, a reminder letter will be sent to the patient regarding the next appointment. BI-RADS CATEGORY  4: Suspicious. Electronically Signed: By:  Margarette Canada M.D. On: 03/20/2020 10:50   MM CLIP PLACEMENT LEFT  Result Date: 03/28/2020 CLINICAL DATA:  Status post ultrasound-guided core biopsy of a left breast mass and two stereotactic biopsies of right breast calcifications. EXAM: 3D DIAGNOSTIC BILATERAL MAMMOGRAM POST ULTRASOUND BIOPSY OF THE LEFT BREAST AND STEREOTACTIC BIOPSY OF THE RIGHT BREAST X2 COMPARISON:  Previous exam(s). FINDINGS: 3D Mammographic images were obtained following ultrasound guided biopsy of the left breast and stereotactic biopsy of the right breast x2. The ribbon shaped biopsy marker clip in the 12 o'clock region of the left breast is in appropriate position. The coil shaped biopsy marker clip in the posterior aspect of the lower outer quadrant of the right breast and the X shaped clip in the anterior aspect of the lower outer quadrant of the right breast are in appropriate position. IMPRESSION: Appropriate positioning of the ribbon shaped shaped biopsy marking clip at the site of biopsy in the 12 o'clock region of the left breast and coil shaped clip in the posterior aspect of the lower outer quadrant of the right breast and X shaped clip in the anterior aspect of the lower outer quadrant of the right breast. The patient developed a moderate size hematoma from the stereotactic biopsy of the anterior aspect of the lower outer quadrant of the right breast. Final Assessment: Post Procedure Mammograms for Marker Placement Electronically Signed   By: Lillia Mountain M.D.   On: 03/28/2020 10:13   MM CLIP PLACEMENT RIGHT  Result Date: 03/28/2020 CLINICAL DATA:  Status post ultrasound-guided core biopsy of a left breast mass and two stereotactic biopsies of right breast calcifications. EXAM: 3D DIAGNOSTIC BILATERAL MAMMOGRAM POST ULTRASOUND BIOPSY OF THE LEFT BREAST AND STEREOTACTIC BIOPSY OF THE RIGHT BREAST X2 COMPARISON:  Previous exam(s). FINDINGS: 3D Mammographic images were obtained following ultrasound guided biopsy of the left  breast and stereotactic biopsy of the right breast x2. The ribbon shaped biopsy marker clip in the 12 o'clock region of the left breast is in appropriate position. The coil shaped biopsy marker clip in the posterior aspect of the lower outer quadrant of the right breast and the X shaped clip in the anterior aspect of the lower outer quadrant of the right breast are in appropriate position. IMPRESSION: Appropriate positioning of the ribbon shaped shaped biopsy marking clip at the site of biopsy in the 12 o'clock region of the left breast and coil shaped clip in the posterior aspect of the lower outer quadrant of the right breast and X shaped clip in the anterior aspect of the lower outer quadrant of the right breast. The patient developed a moderate size hematoma from the stereotactic biopsy of the anterior  aspect of the lower outer quadrant of the right breast. Final Assessment: Post Procedure Mammograms for Marker Placement Electronically Signed   By: Lillia Mountain M.D.   On: 03/28/2020 10:13   [place] LT BREAST BX W LOC DEV 1ST LESION IMG BX SPEC US GUIDE  Addendum Date: 04/02/2020   ADDENDUM REPORT: 03/29/2020 12:10 ADDENDUM: Pathology revealed FIBROADENOMA of the LEFT breast, 12:00 o'clock. This was found to be concordant by Dr. Lillia Mountain. 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 This was found to be concordant by Dr. Lillia Mountain. 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 This was found to be concordant by Dr. Lillia Mountain. Pathology results were discussed with the patient by telephone. The patient reported doing well after the biopsies with tenderness at the sites. Post biopsy instructions and care were reviewed and questions were  answered. The patient was encouraged to call [facility] for any additional concerns. My direct phone number was provided. The patient was referred to [facility] on April 04, 2020. Pathology results reported by Terie Purser, RN on 03/29/2020. Electronically Signed   By: Lillia Mountain M.D.   On: 03/29/2020 12:10   Result Date: 04/02/2020 CLINICAL DATA:  Left breast mass and right breast calcifications. EXAM: ULTRASOUND GUIDED LEFT BREAST CORE NEEDLE BIOPSY AND STEREOTACTIC GUIDED RIGHT BREAST CORE NEEDLE BIOPSIES X2 COMPARISON:  Previous exam(s). PROCEDURE: I met with the patient and we discussed the procedure of ultrasound-guided biopsy, including benefits and alternatives. We discussed the high likelihood of a successful procedure. We discussed the risks of the procedure, including infection, bleeding, tissue injury, clip migration, and inadequate sampling. Informed written consent was given. The usual time-out protocol was performed immediately prior to the procedure. Lesion quadrant: 12 o'clock Using sterile technique and 1% Lidocaine as local anesthetic, under direct ultrasound visualization, a 14 gauge spring-loaded device was used to perform biopsy of a mass in the 12 o'clock region of the left breast using a lateral to medial approach. At the conclusion of the procedure ribbon shaped tissue marker clip was deployed into the biopsy cavity. Follow up 2 view mammogram was performed and dictated separately. The patient and I discussed the procedure of stereotactic-guided biopsy including benefits and alternatives. We discussed the high likelihood of a successful procedure. We discussed the risks of the procedure including infection, bleeding, tissue injury, clip migration, and inadequate sampling. Informed written consent was given. The usual time out protocol was performed immediately prior to the procedure.  Using sterile technique and 1% lidocaine and 1% lidocaine with epinephrine as local anesthetic, under stereotactic guidance, a 9 gauge vacuum assisted device was used to perform core needle biopsy of calcifications in the lower outer quadrant of the right breast (posterior) using a lateral to medial approach. Specimen radiograph was performed showing calcifications are present in the tissue samples. Specimens with calcifications are identified for pathology. Lesion quadrant: LOWER OUTER QUADRANT (POSTERIOR EXTENT) At the conclusion of the procedure, coil shaped tissue marker clip was deployed into the biopsy cavity. Follow-up 2-view mammogram was performed and dictated separately. The patient and I discussed the procedure of stereotactic-guided biopsy including benefits and alternatives. We discussed the high likelihood of a successful procedure. We discussed the risks of  the procedure including infection, bleeding, tissue injury, clip migration, and inadequate sampling. Informed written consent was given. The usual time out protocol was performed immediately prior to the procedure. Using sterile technique and 1% lidocaine and 1% lidocaine with epinephrine as local anesthetic, under stereotactic guidance, a 9 gauge vacuum assisted device was used to perform core needle biopsy of calcifications in the lower outer quadrant of the right breast (anterior) using a lateral to medial approach. Specimen radiograph was performed showing calcifications are present in the tissue samples. Specimens with calcifications are identified for pathology. Lesion quadrant: LOWER OUTER QUADRANT (ANTERIOR EXTENT) At the conclusion of the procedure, X shaped tissue marker clip was deployed into the biopsy cavity. Follow-up 2-view mammogram was performed and dictated separately. IMPRESSION: Ultrasound guided biopsy of a mass in the 12 o'clock region of the right breast. Stereotactic biopsy calcifications in the posterior and anterior extent  of the lower outer quadrant of the right breast. No apparent complications. Electronically Signed: By: Lillia Mountain M.D. On: 03/28/2020 09:26   MM RT BREAST BX W LOC DEV 1ST LESION IMAGE BX SPEC STEREO GUIDE  Addendum Date: 04/02/2020   ADDENDUM REPORT: 03/29/2020 12:10 ADDENDUM: Pathology revealed FIBROADENOMA of the LEFT breast, 12:00 o'clock. This was found to be concordant by Dr. Lillia Mountain. 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 This was found to be concordant by Dr. Lillia Mountain. 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 This was found to be concordant by Dr. Lillia Mountain. Pathology results were discussed with the patient by telephone. The patient reported doing well after the biopsies with tenderness at the sites. Post biopsy instructions and care were reviewed and questions were answered. The patient was encouraged to call [facility] for any additional concerns. My direct phone number was provided. The patient was referred to [facility] at [facility] on April 04, 2020. Pathology results reported by Terie Purser, RN on 03/29/2020. Electronically Signed   By: Lillia Mountain M.D.   On: 03/29/2020 12:10   Result Date: 04/02/2020 CLINICAL DATA:  Left breast mass and right breast calcifications. EXAM: ULTRASOUND GUIDED LEFT BREAST CORE NEEDLE BIOPSY AND STEREOTACTIC GUIDED RIGHT BREAST CORE NEEDLE BIOPSIES X2 COMPARISON:  Previous exam(s). PROCEDURE: I met with the patient and we discussed the procedure of ultrasound-guided biopsy, including benefits and alternatives. We discussed the high likelihood of a successful procedure. We discussed the  risks of the procedure, including infection, bleeding, tissue injury, clip migration, and inadequate sampling. Informed written consent was given. The usual time-out protocol was performed immediately prior to the procedure. Lesion quadrant: 12 o'clock Using sterile technique and 1% Lidocaine as local anesthetic, under direct ultrasound visualization, a 14 gauge spring-loaded device was used to perform biopsy of a mass in the 12 o'clock region of the left breast using a lateral to medial approach. At the conclusion of the procedure ribbon shaped tissue marker clip was deployed into the biopsy cavity. Follow up 2 view mammogram was performed and dictated separately. The patient and I discussed the procedure of stereotactic-guided biopsy including benefits and alternatives. We discussed the high likelihood of a successful procedure. We discussed the risks of the procedure including infection, bleeding, tissue injury, clip migration, and inadequate sampling. Informed  written consent was given. The usual time out protocol was performed immediately prior to the procedure. Using sterile technique and 1% lidocaine and 1% lidocaine with epinephrine as local anesthetic, under stereotactic guidance, a 9 gauge vacuum assisted device was used to perform core needle biopsy of calcifications in the lower outer quadrant of the right breast (posterior) using a lateral to medial approach. Specimen radiograph was performed showing calcifications are present in the tissue samples. Specimens with calcifications are identified for pathology. Lesion quadrant: LOWER OUTER QUADRANT (POSTERIOR EXTENT) At the conclusion of the procedure, coil shaped tissue marker clip was deployed into the biopsy cavity. Follow-up 2-view mammogram was performed and dictated separately. The patient and I discussed the procedure of stereotactic-guided biopsy including benefits and alternatives. We discussed the high likelihood of a successful procedure. We  discussed the risks of the procedure including infection, bleeding, tissue injury, clip migration, and inadequate sampling. Informed written consent was given. The usual time out protocol was performed immediately prior to the procedure. Using sterile technique and 1% lidocaine and 1% lidocaine with epinephrine as local anesthetic, under stereotactic guidance, a 9 gauge vacuum assisted device was used to perform core needle biopsy of calcifications in the lower outer quadrant of the right breast (anterior) using a lateral to medial approach. Specimen radiograph was performed showing calcifications are present in the tissue samples. Specimens with calcifications are identified for pathology. Lesion quadrant: LOWER OUTER QUADRANT (ANTERIOR EXTENT) At the conclusion of the procedure, X shaped tissue marker clip was deployed into the biopsy cavity. Follow-up 2-view mammogram was performed and dictated separately. IMPRESSION: Ultrasound guided biopsy of a mass in the 12 o'clock region of the right breast. Stereotactic biopsy calcifications in the posterior and anterior extent of the lower outer quadrant of the right breast. No apparent complications. Electronically Signed: By: Lillia Mountain M.D. On: 03/28/2020 09:26   MM RT BREAST BX W LOC DEV EA AD LESION IMG BX SPEC STEREO GUIDE  Addendum Date: 04/02/2020   ADDENDUM REPORT: 03/29/2020 12:10 ADDENDUM: Pathology revealed FIBROADENOMA of the LEFT breast, 12:00 o'clock. This was found to be concordant by Dr. Lillia Mountain. 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 This was found to be concordant by Dr. Lillia Mountain. 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 This was found to be concordant by Dr.  Lillia Mountain. Pathology results were discussed with the patient by telephone. The patient reported doing well after the biopsies with tenderness at the sites. Post biopsy instructions and care were reviewed and questions were answered. The patient was encouraged to call [facility] for any additional concerns. My direct phone number was provided. The patient was referred to [facility] at [facility] on April 04, 2020. Pathology results reported by Terie Purser, RN on 03/29/2020. Electronically Signed   By: Lillia Mountain M.D.   On: 03/29/2020 12:10   Result Date: 04/02/2020 CLINICAL DATA:  Left breast mass and right breast calcifications. EXAM: ULTRASOUND GUIDED LEFT BREAST CORE NEEDLE BIOPSY AND STEREOTACTIC GUIDED RIGHT BREAST CORE NEEDLE BIOPSIES X2 COMPARISON:  Previous exam(s). PROCEDURE: I met with the patient and we discussed the procedure of ultrasound-guided biopsy, including benefits and alternatives. We discussed the high likelihood of a successful procedure. We discussed  the risks of the procedure, including infection, bleeding, tissue injury, clip migration, and inadequate sampling. Informed written consent was given. The usual time-out protocol was performed immediately prior to the procedure. Lesion quadrant: 12 o'clock Using sterile technique and 1% Lidocaine as local anesthetic, under direct ultrasound visualization, a 14 gauge spring-loaded device was used to perform biopsy of a mass in the 12 o'clock region of the left breast using a lateral to medial approach. At the conclusion of the procedure ribbon shaped tissue marker clip was deployed into the biopsy cavity. Follow up 2 view mammogram was performed and dictated separately. The patient and I discussed the procedure of stereotactic-guided biopsy including benefits and alternatives. We discussed the high likelihood of a successful procedure. We discussed the  risks of the procedure including infection, bleeding, tissue injury, clip migration, and inadequate sampling. Informed written consent was given. The usual time out protocol was performed immediately prior to the procedure. Using sterile technique and 1% lidocaine and 1% lidocaine with epinephrine as local anesthetic, under stereotactic guidance, a 9 gauge vacuum assisted device was used to perform core needle biopsy of calcifications in the lower outer quadrant of the right breast (posterior) using a lateral to medial approach. Specimen radiograph was performed showing calcifications are present in the tissue samples. Specimens with calcifications are identified for pathology. Lesion quadrant: LOWER OUTER QUADRANT (POSTERIOR EXTENT) At the conclusion of the procedure, coil shaped tissue marker clip was deployed into the biopsy cavity. Follow-up 2-view mammogram was performed and dictated separately. The patient and I discussed the procedure of stereotactic-guided biopsy including benefits and alternatives. We discussed the high likelihood of a successful procedure. We discussed the risks of the procedure including infection, bleeding, tissue injury, clip migration, and inadequate sampling. Informed written consent was given. The usual time out protocol was performed immediately prior to the procedure. Using sterile technique and 1% lidocaine and 1% lidocaine with epinephrine as local anesthetic, under stereotactic guidance, a 9 gauge vacuum assisted device was used to perform core needle biopsy of calcifications in the lower outer quadrant of the right breast (anterior) using a lateral to medial approach. Specimen radiograph was performed showing calcifications are present in the tissue samples. Specimens with calcifications are identified for pathology. Lesion quadrant: LOWER OUTER QUADRANT (ANTERIOR EXTENT) At the conclusion of the procedure, X shaped tissue marker clip was deployed into the biopsy cavity.  Follow-up 2-view mammogram was performed and dictated separately. IMPRESSION: Ultrasound guided biopsy of a mass in the 12 o'clock region of the right breast. Stereotactic biopsy calcifications in the posterior and anterior extent of the lower outer quadrant of the right breast. No apparent complications. Electronically Signed: By: Lillia Mountain M.D. On: 03/28/2020 09:26      IMPRESSION: Stage 0 Right Breast LOQ, DCIS, ER+ / PR-, Grade 3 The patient has a large area of calcifications and it may be difficult to remove this area and obtain clear margins or a good cosmetic result.  Patient however would like to attempt breast conserving surgery rather than proceeding directly to a mastectomy. Otherwise, she will be a good candidate for breast conservation with radiotherapy to the right breast. We discussed the general course of radiation, potential side effects, and toxicities with radiation and the patient is interested in this approach.  We discussed that given her cosmetic implants she would not be a candidate for hypofractionated accelerated radiation therapy.  She would be a candidate for standard course of radiation therapy over approximately 6-1/2 weeks.  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  We also discussed that given her underlying cosmetic implants there is a risk for capsular fibrosis around the implant which potentially could result in chronic pain requiring removal of the implants.   PLAN:  1. Bracketed lumpectomy 2. Adjuvant radiation, conventional fractionation given cosmetic breast implants 3. Adjuvant hormonal therapy optional, only for prophylaxis of new breast cancer   ------------------------------------------------  Blair Promise, PhD, MD  This document serves as a record of services personally performed by Gery Pray, MD. It was created on his behalf by  Clerance Lav, a trained medical scribe. The creation of this record is based on the scribe's personal observations and the provider's statements to them. This document has been checked and approved by the attending provider.

## 2020-04-05 ENCOUNTER — Telehealth: Payer: Self-pay | Admitting: Oncology

## 2020-04-05 NOTE — Telephone Encounter (Signed)
Scheduled appt per 3/23 los. Pt will look at MyChart for appt.

## 2020-04-09 ENCOUNTER — Encounter: Payer: Self-pay | Admitting: *Deleted

## 2020-04-09 DIAGNOSIS — D0511 Intraductal carcinoma in situ of right breast: Secondary | ICD-10-CM

## 2020-04-10 ENCOUNTER — Other Ambulatory Visit: Payer: Self-pay | Admitting: General Surgery

## 2020-04-10 DIAGNOSIS — C50511 Malignant neoplasm of lower-outer quadrant of right female breast: Secondary | ICD-10-CM

## 2020-04-10 DIAGNOSIS — Z170 Estrogen receptor positive status [ER+]: Secondary | ICD-10-CM

## 2020-04-12 ENCOUNTER — Telehealth: Payer: Self-pay | Admitting: *Deleted

## 2020-04-12 ENCOUNTER — Encounter: Payer: Self-pay | Admitting: *Deleted

## 2020-04-12 NOTE — Telephone Encounter (Signed)
Attempted to call patient to follow up from [facility] 3/23. Her voicemail was full and I was unable to leave a message.

## 2020-04-18 ENCOUNTER — Encounter (INDEPENDENT_AMBULATORY_CARE_PROVIDER_SITE_OTHER): Payer: PPO | Admitting: Ophthalmology

## 2020-04-23 ENCOUNTER — Encounter (HOSPITAL_BASED_OUTPATIENT_CLINIC_OR_DEPARTMENT_OTHER): Payer: Self-pay | Admitting: General Surgery

## 2020-04-23 ENCOUNTER — Other Ambulatory Visit: Payer: Self-pay

## 2020-04-30 ENCOUNTER — Encounter (HOSPITAL_BASED_OUTPATIENT_CLINIC_OR_DEPARTMENT_OTHER)
Admission: RE | Admit: 2020-04-30 | Discharge: 2020-04-30 | Disposition: A | Discharge status: Home / Self Care | Payer: PPO | Source: Ambulatory Visit | Attending: General Surgery | Admitting: General Surgery

## 2020-04-30 ENCOUNTER — Other Ambulatory Visit (HOSPITAL_COMMUNITY)
Admission: RE | Admit: 2020-04-30 | Discharge: 2020-04-30 | Disposition: A | Discharge status: Home / Self Care | Payer: PPO | Source: Ambulatory Visit | Attending: General Surgery | Admitting: General Surgery

## 2020-04-30 DIAGNOSIS — Z01812 Encounter for preprocedural laboratory examination: Secondary | ICD-10-CM | POA: Insufficient documentation

## 2020-04-30 DIAGNOSIS — Z20822 Contact with and (suspected) exposure to covid-19: Secondary | ICD-10-CM | POA: Insufficient documentation

## 2020-04-30 LAB — SARS CORONAVIRUS 2 (TAT 6-24 HRS): SARS Coronavirus 2: NEGATIVE

## 2020-04-30 NOTE — Progress Notes (Signed)

## 2020-05-01 ENCOUNTER — Ambulatory Visit
Admission: RE | Admit: 2020-05-01 | Discharge: 2020-05-01 | Disposition: A | Discharge status: Home / Self Care | Payer: PPO | Source: Ambulatory Visit | Attending: General Surgery | Admitting: General Surgery

## 2020-05-01 ENCOUNTER — Other Ambulatory Visit: Payer: Self-pay

## 2020-05-01 DIAGNOSIS — Z170 Estrogen receptor positive status [ER+]: Secondary | ICD-10-CM

## 2020-05-01 DIAGNOSIS — D0511 Intraductal carcinoma in situ of right breast: Secondary | ICD-10-CM | POA: Diagnosis not present

## 2020-05-01 DIAGNOSIS — C50511 Malignant neoplasm of lower-outer quadrant of right female breast: Secondary | ICD-10-CM

## 2020-05-01 NOTE — H&P (Signed)
Kimberly Klein Appointment: 04/04/2020 1:00 PM Location: [facility] Patient #: [id number] DOB: September 30, 1950 Undefined / Language: [name] / Race: White Female   History of Present Illness [name]; 04/04/2020 2:26 PM) The patient is a 70 year old female who presents with breast cancer. pt is a 70 yo F who presented wtih screening detected right breast calcifications. She also had a left breast mass on screening. Dx imaging was performed showing 5 cm of calcs in the LOQ on the right. The left breast mass was around 1 cm. She had biopsies of the right breast at the anterior and posterior aspect of the calcifications. She also had the left breast mass sampled. The left breast mass was benign. 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   She has no prior cancer history before this. Her brother had bladder cancer and sister with cholangiocarcinoma, brother with multiple myeloma. She had menarche at age 59. she is post menopausal. She is a G3P3 wtih first child at age 24.   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    dx mammo/us 03/20/20 [facility]  IMPRESSION: 1. Suspicious LOWER OUTER RIGHT breast calcifications spanning a distance of at least 5 cm. Tissue sampling of the anterior and posterior aspects (if technically feasible due to implant) recommended. 2. Indeterminate 1 cm UPPER LEFT breast mass. Tissue sampling is recommended given RIGHT breast findings. 3. No abnormal appearing axillary lymph nodes.  RECOMMENDATION: 1. Stereotactic guided biopsies of anterior and posterior aspects of LOWER OUTER RIGHT breast calcifications (the posterior aspect may be technically difficult to biopsy given implant). 2. Ultrasound-guided LEFT breast biopsy.  These biopsies have been scheduled.  I have discussed the findings and recommendations with the patient. If applicable, a reminder  letter will be sent to the patient regarding the next appointment.  BI-RADS CATEGORY 4: Suspicious.  pathology 03/28/20 1. Breast, left, needle core biopsy, 12 o'clock - FIBROADENOMA. - NO EVIDENCE OF MALIGNANCY. 2. Breast, right, needle core biopsy, LOQ (posterior) - DUCTAL CARCINOMA IN SITU WITH NECROSIS AND CALCIFICATIONS. - SEE MICROSCOPIC DESCRIPTION. 3. Breast, right, needle core biopsy, LOQ (anterior) - DUCTAL CARCINOMA IN SITU, WITH NECROSIS AND CALCIFICATION. - COMPLEX SCLEROSING LESION WITH CALCIFICATIONS. - SEE MICROSCOPIC DESCRIPTION. Microscopic Comment 2. and 3. The DCIS has intermediate to high nuclear grade Estrogen Receptor: 20%, POSITIVE, WEAK STAINING INTENSITY Progesterone Receptor: 0%, NEGATIVE   Past Surgical History [name], RN; 04/04/2020 8:16 AM) Appendectomy  Breast Augmentation  Bilateral. Breast Biopsy  Bilateral. Cataract Surgery  Bilateral. Colon Polyp Removal - Colonoscopy  Oral Surgery   Diagnostic Studies History [name], RN; 04/04/2020 8:16 AM) Colonoscopy  1-5 years ago Mammogram  within last year Pap Smear  >5 years ago  Medication History [name], RN; 04/04/2020 8:16 AM) Medications Reconciled  Social History [name], RN; 04/04/2020 8:16 AM) Alcohol use  Occasional alcohol use. Caffeine use  Coffee. No drug use  Tobacco use  Former smoker.  Family History [name], RN; 04/04/2020 8:16 AM) Alcohol Abuse  Brother. Arthritis  Mother. Cerebrovascular Accident  Father. Depression  Sister. Heart disease in female family member before age 37  Hypertension  Father. Kidney Disease  Brother. Melanoma  Daughter. Respiratory Condition  Sister. Seizure disorder  Father.  Pregnancy / Birth History [name], RN; 04/04/2020 8:16 AM) Age at menarche  38 years. Age of menopause  42-55 Gravida  3 Irregular periods  Length (months) of breastfeeding  12-24 Maternal age  7-25 Para   2  Other Problems [name], RN; 04/04/2020 8:16 AM) Gastroesophageal Reflux Disease  Hemorrhoids  High blood pressure  Lump In Breast     Review of Systems [name] RN; 04/04/2020 8:16 AM) General Not Present- Appetite Loss, Chills, Fatigue, Fever, Night Sweats, Weight Gain and Weight Loss. Skin Not Present- Change in Wart/Mole, Dryness, Hives, Jaundice, New Lesions, Non-Healing Wounds, Rash and Ulcer. HEENT Present- Wears glasses/contact lenses. Not Present- Earache, Hearing Loss, Hoarseness, Nose Bleed, Oral Ulcers, Ringing in the Ears, Seasonal Allergies, Sinus Pain, Sore Throat, Visual Disturbances and Yellow Eyes. Respiratory Not Present- Bloody sputum, Chronic Cough, Difficulty Breathing, Snoring and Wheezing. Breast Present- Breast Mass and Breast Pain. Not Present- Nipple Discharge and Skin Changes. Cardiovascular Present- Rapid Heart Rate. Not Present- Chest Pain, Difficulty Breathing Lying Down, Leg Cramps, Palpitations, Shortness of Breath and Swelling of Extremities. Gastrointestinal Present- Hemorrhoids. Not Present- Abdominal Pain, Bloating, Bloody Stool, Change in Bowel Habits, Chronic diarrhea, Constipation, Difficulty Swallowing, Excessive gas, Gets full quickly at meals, Indigestion, Nausea, Rectal Pain and Vomiting. Female Genitourinary Not Present- Frequency, Nocturia, Painful Urination, Pelvic Pain and Urgency. Musculoskeletal Not Present- Back Pain, Joint Pain, Joint Stiffness, Muscle Pain, Muscle Weakness and Swelling of Extremities. Neurological Not Present- Decreased Memory, Fainting, Headaches, Numbness, Seizures, Tingling, Tremor, Trouble walking and Weakness. Psychiatric Not Present- Anxiety, Bipolar, Change in Sleep Pattern, Depression, Fearful and Frequent crying. Endocrine Not Present- Cold Intolerance, Excessive Hunger, Hair Changes, Heat Intolerance, Hot flashes and New Diabetes. Hematology Not Present- Blood Thinners, Easy Bruising, Excessive  bleeding, Gland problems, HIV and Persistent Infections.  Vitals [name]; 04/04/2020 1:46 PM) 04/04/2020 1:46 PM Weight: 176.2 lb Height: 62in Body Surface Area: 1.81 m Body Mass Index: 32.23 kg/m  Temp.: 97.20F  Pulse: 84 (Regular)  Resp.: 18 (Unlabored)  BP: 156/78(Sitting, Left Arm, Standard)       Physical Exam [name]; 04/04/2020 1:52 PM) General Mental Status-Alert. General Appearance-Consistent with stated age. Hydration-Well hydrated. Voice-Normal.  Head and Neck Head-normocephalic, atraumatic with no lesions or palpable masses. Trachea-midline. Thyroid Gland Characteristics - normal size and consistency.  Eye Eyeball - Bilateral-Extraocular movements intact. Sclera/Conjunctiva - Bilateral-No scleral icterus.  Chest and Lung Exam Chest and lung exam reveals -quiet, even and easy respiratory effort with no use of accessory muscles and on auscultation, normal breath sounds, no adventitious sounds and normal vocal resonance. Inspection Chest Wall - Normal. Back - normal.  Breast Note: right breast with significant bruising LOQ. some hematoma palpable at 6 o'clock. no other palpable masses. no LAD, no nipple retraction or skin dimpling. left breast normal.   Cardiovascular Cardiovascular examination reveals -normal heart sounds, regular rate and rhythm with no murmurs and normal pedal pulses bilaterally.  Abdomen Inspection Inspection of the abdomen reveals - No Hernias. Palpation/Percussion Palpation and Percussion of the abdomen reveal - Soft, Non Tender, No Rebound tenderness, No Rigidity (guarding) and No hepatosplenomegaly. Auscultation Auscultation of the abdomen reveals - Bowel sounds normal.  Neurologic Neurologic evaluation reveals -alert and oriented x 3 with no impairment of recent or remote memory. Mental Status-Normal.  Musculoskeletal Global Assessment -Note: no gross  deformities.  Normal Exam - Left-Upper Extremity Strength Normal and Lower Extremity Strength Normal. Normal Exam - Right-Upper Extremity Strength Normal and Lower Extremity Strength Normal.  Lymphatic Head & Neck  General Head & Neck Lymphatics: Bilateral - Description - Normal. Axillary  General Axillary Region: Bilateral - Description - Normal. Tenderness -  Non Tender. Femoral & Inguinal  Generalized Femoral & Inguinal Lymphatics: Bilateral - Description - No Generalized lymphadenopathy.    Assessment & Plan [name]; 04/04/2020 2:26 PM) MALIGNANT NEOPLASM OF LOWER-OUTER QUADRANT OF RIGHT BREAST OF FEMALE, ESTROGEN RECEPTOR POSITIVE (C50.511) Impression: Pt has new dx of cTis right breast cancer, ER +/PR -. We will try to do a bracketed lumpectomy, although this is a larger area and may create a significant defect. I discussed that large lumpectomies also are more likely to have a positive margin requiring additional surgery.  This would be followed by radiation (if lumpectomy successful), but probably not antihormone tx.  The surgical procedure was described to the patient. I discussed the incision type and location and that we would need radiology involved on with a wire or seed marker and/or sentinel node.  The risks and benefits of the procedure were described to the patient and she wishes to proceed.  We discussed the risks bleeding, infection, damage to other structures, need for further procedures/surgeries. We discussed the risk of seroma. The patient was advised if the area in the breast in cancer, we may need to go back to surgery for additional tissue to obtain negative margins or for a lymph node biopsy. The patient was advised that these are the most common complications, but that others can occur as well. They were advised against taking aspirin or other anti-inflammatory agents/blood thinners the week before surgery. S/P BILATERAL BREAST IMPLANTS  (Z98.82) Impression: Discussed risk of damage or infection to implants possibly requiring removal.

## 2020-05-02 ENCOUNTER — Ambulatory Visit
Admission: RE | Admit: 2020-05-02 | Discharge: 2020-05-02 | Disposition: A | Discharge status: Home / Self Care | Payer: PPO | Source: Ambulatory Visit | Attending: General Surgery | Admitting: General Surgery

## 2020-05-02 ENCOUNTER — Other Ambulatory Visit: Payer: Self-pay

## 2020-05-02 ENCOUNTER — Encounter (HOSPITAL_BASED_OUTPATIENT_CLINIC_OR_DEPARTMENT_OTHER): Payer: Self-pay | Admitting: General Surgery

## 2020-05-02 ENCOUNTER — Encounter (HOSPITAL_BASED_OUTPATIENT_CLINIC_OR_DEPARTMENT_OTHER)
Admission: RE | Disposition: A | Discharge status: Home / Self Care | Payer: Self-pay | Source: Home / Self Care | Attending: General Surgery

## 2020-05-02 ENCOUNTER — Ambulatory Visit (HOSPITAL_BASED_OUTPATIENT_CLINIC_OR_DEPARTMENT_OTHER): Payer: PPO | Admitting: Anesthesiology

## 2020-05-02 ENCOUNTER — Ambulatory Visit (HOSPITAL_BASED_OUTPATIENT_CLINIC_OR_DEPARTMENT_OTHER)
Admission: RE | Admit: 2020-05-02 | Discharge: 2020-05-02 | Disposition: A | Discharge status: Home / Self Care | Payer: PPO | Attending: General Surgery | Admitting: General Surgery

## 2020-05-02 DIAGNOSIS — Z87891 Personal history of nicotine dependence: Secondary | ICD-10-CM | POA: Diagnosis not present

## 2020-05-02 DIAGNOSIS — Z170 Estrogen receptor positive status [ER+]: Secondary | ICD-10-CM

## 2020-05-02 DIAGNOSIS — N641 Fat necrosis of breast: Secondary | ICD-10-CM | POA: Diagnosis not present

## 2020-05-02 DIAGNOSIS — N6489 Other specified disorders of breast: Secondary | ICD-10-CM | POA: Diagnosis not present

## 2020-05-02 DIAGNOSIS — C50511 Malignant neoplasm of lower-outer quadrant of right female breast: Secondary | ICD-10-CM

## 2020-05-02 DIAGNOSIS — C50911 Malignant neoplasm of unspecified site of right female breast: Secondary | ICD-10-CM | POA: Diagnosis not present

## 2020-05-02 DIAGNOSIS — D0511 Intraductal carcinoma in situ of right breast: Secondary | ICD-10-CM | POA: Insufficient documentation

## 2020-05-02 DIAGNOSIS — K219 Gastro-esophageal reflux disease without esophagitis: Secondary | ICD-10-CM | POA: Diagnosis not present

## 2020-05-02 DIAGNOSIS — N6011 Diffuse cystic mastopathy of right breast: Secondary | ICD-10-CM | POA: Diagnosis not present

## 2020-05-02 DIAGNOSIS — I10 Essential (primary) hypertension: Secondary | ICD-10-CM | POA: Diagnosis not present

## 2020-05-02 DIAGNOSIS — M4802 Spinal stenosis, cervical region: Secondary | ICD-10-CM | POA: Diagnosis not present

## 2020-05-02 HISTORY — PX: BREAST LUMPECTOMY WITH RADIOACTIVE SEED LOCALIZATION: SHX6424

## 2020-05-02 HISTORY — PX: BREAST LUMPECTOMY: SHX2

## 2020-05-02 SURGERY — BREAST LUMPECTOMY WITH RADIOACTIVE SEED LOCALIZATION
Anesthesia: General | Site: Breast | Laterality: Right

## 2020-05-02 MED ORDER — LIDOCAINE HCL (CARDIAC) PF 100 MG/5ML IV SOSY
PREFILLED_SYRINGE | INTRAVENOUS | Status: DC | PRN
  Administered 2020-05-02: 80 mg via INTRAVENOUS

## 2020-05-02 MED ORDER — CEFAZOLIN SODIUM-DEXTROSE 2-4 GM/100ML-% IV SOLN
INTRAVENOUS | Status: AC
  Filled 2020-05-02: qty 100

## 2020-05-02 MED ORDER — ONDANSETRON HCL 4 MG/2ML IJ SOLN
INTRAMUSCULAR | Status: AC
  Filled 2020-05-02: qty 2

## 2020-05-02 MED ORDER — FENTANYL CITRATE (PF) 100 MCG/2ML IJ SOLN
INTRAMUSCULAR | Status: AC
  Filled 2020-05-02: qty 2

## 2020-05-02 MED ORDER — CEFAZOLIN SODIUM-DEXTROSE 2-4 GM/100ML-% IV SOLN
2.0000 g | INTRAVENOUS | Status: AC
  Administered 2020-05-02: 2 g via INTRAVENOUS

## 2020-05-02 MED ORDER — OXYCODONE HCL 5 MG PO TABS
5.0000 mg | ORAL_TABLET | Freq: Once | ORAL | Status: AC | PRN
  Administered 2020-05-02: 5 mg via ORAL

## 2020-05-02 MED ORDER — PHENYLEPHRINE 40 MCG/ML (10ML) SYRINGE FOR IV PUSH (FOR BLOOD PRESSURE SUPPORT)
PREFILLED_SYRINGE | INTRAVENOUS | Status: AC
  Filled 2020-05-02: qty 10

## 2020-05-02 MED ORDER — LIDOCAINE 2% (20 MG/ML) 5 ML SYRINGE
INTRAMUSCULAR | Status: AC
  Filled 2020-05-02: qty 5

## 2020-05-02 MED ORDER — EPHEDRINE 5 MG/ML INJ
INTRAVENOUS | Status: AC
  Filled 2020-05-02: qty 10

## 2020-05-02 MED ORDER — HYDROMORPHONE HCL 1 MG/ML IJ SOLN: 0.2500 mg | INTRAMUSCULAR | Status: DC | PRN

## 2020-05-02 MED ORDER — ACETAMINOPHEN 500 MG PO TABS
1000.0000 mg | ORAL_TABLET | ORAL | Status: AC
  Administered 2020-05-02: 1000 mg via ORAL

## 2020-05-02 MED ORDER — DROPERIDOL 2.5 MG/ML IJ SOLN
INTRAMUSCULAR | Status: DC | PRN
  Administered 2020-05-02: .625 mg via INTRAVENOUS

## 2020-05-02 MED ORDER — OXYCODONE HCL 5 MG PO TABS
ORAL_TABLET | ORAL | Status: AC
  Filled 2020-05-02: qty 1

## 2020-05-02 MED ORDER — LIDOCAINE HCL 1 % IJ SOLN
INTRAMUSCULAR | Status: DC | PRN
  Administered 2020-05-02: 20 mL

## 2020-05-02 MED ORDER — PROPOFOL 10 MG/ML IV BOLUS
INTRAVENOUS | Status: DC | PRN
  Administered 2020-05-02: 150 mg via INTRAVENOUS

## 2020-05-02 MED ORDER — OXYCODONE HCL 5 MG PO TABS: 5.0000 mg | ORAL_TABLET | Freq: Four times a day (QID) | ORAL | 0 refills | Status: DC | PRN

## 2020-05-02 MED ORDER — DEXAMETHASONE SODIUM PHOSPHATE 10 MG/ML IJ SOLN
INTRAMUSCULAR | Status: AC
  Filled 2020-05-02: qty 1

## 2020-05-02 MED ORDER — MIDAZOLAM HCL 2 MG/2ML IJ SOLN
INTRAMUSCULAR | Status: AC
  Filled 2020-05-02: qty 2

## 2020-05-02 MED ORDER — DROPERIDOL 2.5 MG/ML IJ SOLN: 0.6250 mg | Freq: Once | INTRAMUSCULAR | Status: DC | PRN

## 2020-05-02 MED ORDER — CHLORHEXIDINE GLUCONATE CLOTH 2 % EX PADS: 6.0000 | MEDICATED_PAD | Freq: Once | CUTANEOUS | Status: DC

## 2020-05-02 MED ORDER — ONDANSETRON HCL 4 MG/2ML IJ SOLN
INTRAMUSCULAR | Status: DC | PRN
  Administered 2020-05-02: 4 mg via INTRAVENOUS

## 2020-05-02 MED ORDER — OXYCODONE HCL 5 MG/5ML PO SOLN: 5.0000 mg | Freq: Once | ORAL | Status: AC | PRN

## 2020-05-02 MED ORDER — ACETAMINOPHEN 500 MG PO TABS
ORAL_TABLET | ORAL | Status: AC
  Filled 2020-05-02: qty 2

## 2020-05-02 MED ORDER — FENTANYL CITRATE (PF) 100 MCG/2ML IJ SOLN
INTRAMUSCULAR | Status: DC | PRN
  Administered 2020-05-02 (×4): 50 ug via INTRAVENOUS

## 2020-05-02 MED ORDER — LACTATED RINGERS IV SOLN: INTRAVENOUS | Status: DC

## 2020-05-02 MED ORDER — MIDAZOLAM HCL 5 MG/5ML IJ SOLN
INTRAMUSCULAR | Status: DC | PRN
  Administered 2020-05-02: 2 mg via INTRAVENOUS

## 2020-05-02 MED ORDER — SUCCINYLCHOLINE CHLORIDE 200 MG/10ML IV SOSY
PREFILLED_SYRINGE | INTRAVENOUS | Status: AC
  Filled 2020-05-02: qty 10

## 2020-05-02 SURGICAL SUPPLY — 54 items
ADH SKN CLS APL DERMABOND .7 (GAUZE/BANDAGES/DRESSINGS) ×1
APL PRP STRL LF DISP 70% ISPRP (MISCELLANEOUS) ×1
BINDER BREAST LRG (GAUZE/BANDAGES/DRESSINGS) IMPLANT
BINDER BREAST MEDIUM (GAUZE/BANDAGES/DRESSINGS) IMPLANT
BINDER BREAST XLRG (GAUZE/BANDAGES/DRESSINGS) ×2 IMPLANT
BINDER BREAST XXLRG (GAUZE/BANDAGES/DRESSINGS) IMPLANT
BLADE SURG 10 STRL SS (BLADE) ×2 IMPLANT
BLADE SURG 15 STRL LF DISP TIS (BLADE) IMPLANT
BLADE SURG 15 STRL SS (BLADE)
CANISTER SUC SOCK COL 7IN (MISCELLANEOUS) IMPLANT
CANISTER SUCT 1200ML W/VALVE (MISCELLANEOUS) IMPLANT
CHLORAPREP W/TINT 26 (MISCELLANEOUS) ×2 IMPLANT
CLIP VESOCCLUDE LG 6/CT (CLIP) ×2 IMPLANT
CLIP VESOCCLUDE MED 6/CT (CLIP) IMPLANT
COVER BACK TABLE 60X90IN (DRAPES) ×2 IMPLANT
COVER MAYO STAND STRL (DRAPES) ×2 IMPLANT
COVER PROBE W GEL 5X96 (DRAPES) ×2 IMPLANT
COVER WAND RF STERILE (DRAPES) IMPLANT
DECANTER SPIKE VIAL GLASS SM (MISCELLANEOUS) IMPLANT
DERMABOND ADVANCED (GAUZE/BANDAGES/DRESSINGS) ×1
DERMABOND ADVANCED .7 DNX12 (GAUZE/BANDAGES/DRESSINGS) ×1 IMPLANT
DRAPE LAPAROSCOPIC ABDOMINAL (DRAPES) ×2 IMPLANT
DRAPE UTILITY XL STRL (DRAPES) ×2 IMPLANT
ELECT COATED BLADE 2.86 ST (ELECTRODE) ×2 IMPLANT
ELECT REM PT RETURN 9FT ADLT (ELECTROSURGICAL) ×2
ELECTRODE REM PT RTRN 9FT ADLT (ELECTROSURGICAL) ×1 IMPLANT
GAUZE SPONGE 4X4 12PLY STRL LF (GAUZE/BANDAGES/DRESSINGS) ×2 IMPLANT
GLOVE SURG ENC MOIS LTX SZ6 (GLOVE) ×2 IMPLANT
GLOVE SURG UNDER POLY LF SZ6.5 (GLOVE) ×2 IMPLANT
GOWN STRL REUS W/ TWL LRG LVL3 (GOWN DISPOSABLE) ×2 IMPLANT
GOWN STRL REUS W/ TWL XL LVL3 (GOWN DISPOSABLE) ×1 IMPLANT
GOWN STRL REUS W/TWL 2XL LVL3 (GOWN DISPOSABLE) ×2 IMPLANT
GOWN STRL REUS W/TWL LRG LVL3 (GOWN DISPOSABLE) ×4
GOWN STRL REUS W/TWL XL LVL3 (GOWN DISPOSABLE) ×2
KIT MARKER MARGIN INK (KITS) ×2 IMPLANT
LIGHT WAVEGUIDE WIDE FLAT (MISCELLANEOUS) ×2 IMPLANT
NEEDLE HYPO 25X1 1.5 SAFETY (NEEDLE) ×2 IMPLANT
NS IRRIG 1000ML POUR BTL (IV SOLUTION) ×2 IMPLANT
PACK BASIN DAY SURGERY FS (CUSTOM PROCEDURE TRAY) ×2 IMPLANT
PENCIL SMOKE EVACUATOR (MISCELLANEOUS) ×2 IMPLANT
SLEEVE SCD COMPRESS KNEE MED (STOCKING) ×2 IMPLANT
SPONGE LAP 18X18 RF (DISPOSABLE) ×2 IMPLANT
STRIP CLOSURE SKIN 1/2X4 (GAUZE/BANDAGES/DRESSINGS) ×2 IMPLANT
SUT MNCRL AB 4-0 PS2 18 (SUTURE) ×2 IMPLANT
SUT SILK 2 0 SH (SUTURE) IMPLANT
SUT VIC AB 2-0 SH 27 (SUTURE) ×2
SUT VIC AB 2-0 SH 27XBRD (SUTURE) ×1 IMPLANT
SUT VIC AB 3-0 SH 27 (SUTURE) ×6
SUT VIC AB 3-0 SH 27X BRD (SUTURE) ×3 IMPLANT
SYR CONTROL 10ML LL (SYRINGE) ×2 IMPLANT
TOWEL GREEN STERILE FF (TOWEL DISPOSABLE) ×2 IMPLANT
TRAY FAXITRON CT DISP (TRAY / TRAY PROCEDURE) ×2 IMPLANT
TUBE CONNECTING 20X1/4 (TUBING) ×2 IMPLANT
YANKAUER SUCT BULB TIP NO VENT (SUCTIONS) ×2 IMPLANT

## 2020-05-02 NOTE — Interval H&P Note (Signed)
History and Physical Interval Note:  05/02/2020 11:48 AM  Kimberly Klein  has presented today for surgery, with the diagnosis of RIGHT BREAST CANCER.  The various methods of treatment have been discussed with the patient and family. After consideration of risks, benefits and other options for treatment, the patient has consented to  Procedure(s): RIGHT BREAST LUMPECTOMY WITH RADIOACTIVE SEED LOCALIZATION X 2 (Right) as a surgical intervention.  The patient's history has been reviewed, patient examined, no change in status, stable for surgery.  I have reviewed the patient's chart and labs.  Questions were answered to the patient's satisfaction.     [name]

## 2020-05-02 NOTE — Anesthesia Preprocedure Evaluation (Addendum)
Anesthesia Evaluation  Patient identified by MRN, date of birth, ID band Patient awake    Reviewed: Allergy & Precautions, NPO status , Patient's Chart, lab work & pertinent test results  Airway Mallampati: II  TM Distance: >3 FB Neck ROM: Full    Dental  (+) Dental Advisory Given, Teeth Intact   Pulmonary neg pulmonary ROS, former smoker,    Pulmonary exam normal breath sounds clear to auscultation       Cardiovascular hypertension, Pt. on medications Normal cardiovascular exam Rhythm:Regular Rate:Normal     Neuro/Psych  Neuromuscular disease    GI/Hepatic Neg liver ROS, GERD  ,  Endo/Other  negative endocrine ROS  Renal/GU negative Renal ROS     Musculoskeletal negative musculoskeletal ROS (+)   Abdominal (+) + obese,   Peds  Hematology negative hematology ROS (+)   Anesthesia Other Findings   Reproductive/Obstetrics                           Anesthesia Physical Anesthesia Plan  ASA: II  Anesthesia Plan: General   Post-op Pain Management:    Induction: Intravenous  PONV Risk Score and Plan: 3 and Ondansetron, Dexamethasone, Treatment may vary due to age or medical condition, Diphenhydramine and Midazolam  Airway Management Planned: LMA  Additional Equipment: None  Intra-op Plan:   Post-operative Plan: Extubation in OR  Informed Consent: I have reviewed the patients History and Physical, chart, labs and discussed the procedure including the risks, benefits and alternatives for the proposed anesthesia with the patient or authorized representative who has indicated his/her understanding and acceptance.     Dental advisory given  Plan Discussed with: CRNA  Anesthesia Plan Comments:        Anesthesia Quick Evaluation

## 2020-05-02 NOTE — Anesthesia Procedure Notes (Signed)
Procedure Name: LMA Insertion Date/Time: 05/02/2020 1:29 PM Performed by: Willa Frater, CRNA Pre-anesthesia Checklist: Patient identified, Emergency Drugs available, Suction available and Patient being monitored Patient Re-evaluated:Patient Re-evaluated prior to induction Oxygen Delivery Method: Circle system utilized Preoxygenation: Pre-oxygenation with 100% oxygen Induction Type: IV induction Ventilation: Mask ventilation without difficulty LMA: LMA inserted LMA Size: 4.0 Number of attempts: 1 Airway Equipment and Method: Bite block Placement Confirmation: positive ETCO2 Tube secured with: Tape Dental Injury: Teeth and Oropharynx as per pre-operative assessment

## 2020-05-02 NOTE — Op Note (Signed)
Right Breast Radioactive seed bracketed lumpectomy  Indications: This patient presents with history of right breast cancer, lower outer quadrant, cTis, receptors +/-  Pre-operative Diagnosis: right breast cancer  Post-operative Diagnosis: Same  Surgeon: [name]   Anesthesia: General endotracheal anesthesia  ASA Class: 2  Procedure Details  The patient was seen in the Holding Room. The risks, benefits, complications, treatment options, and expected outcomes were discussed with the patient. The possibilities of bleeding, infection, the need for additional procedures, failure to diagnose a condition, and creating a complication requiring other procedures or operations were discussed with the patient. The patient concurred with the proposed plan, giving informed consent.  The site of surgery properly noted/marked. The patient was taken to Operating Room # 8, identified, and the procedure verified as right breast seed bracketed lumpectomy.  The right breast and chest were prepped and draped in standard fashion. A lateral incision was made near the previously placed radioactive seeds.  Dissection was carried down around the points of maximum signal intensity. The cautery was used to perform most of the dissection, however, the medial seed was very close to the skin.  The specimen was taken close to the skin with the cautery as most of the breast tissue in the lower outer quadrant.    The specimen was inked with the margin marker paint kit.    Specimen radiography confirmed inclusion of the the lateral clip and seed.  Metzenbaum scissors were used to taken the medial margin directly off the back of the skin and the medial seed and clip were present in this specimen.  The background signal in the breast was zero.  Hemostasis was achieved with cautery.  The implant was exposed in the lower outer quadrant for around 8 mm.  This defect was carefully closed with a 3-0 vicryl.  Clips were not placed into  the cavity due to the proximity of the implant.  Mastopexy was performed by releasing some of the superior and inferior breast tissue from the skin and pulling that together with interrupted 2-0 vicryl sutures to minimize the step off defect.  The wound was irrigated and closed with 3-0 vicryl interrupted deep dermal sutures and 4-0 monocryl running subcuticular suture.      Sterile dressings were applied. At the end of the operation, all sponge, instrument, and needle counts were correct.   Findings: Seed, clip in specimen.  Anterior and medial margins are skin, posterior margin is implant/implant capsule.      Estimated Blood Loss:  min         Specimens: right breast tissue with seed         Complications:  None; patient tolerated the procedure well.         Disposition: PACU - hemodynamically stable.         Condition: stable

## 2020-05-02 NOTE — Transfer of Care (Signed)
Immediate Anesthesia Transfer of Care Note  Patient: Kimberly Klein  Procedure(s) Performed: RIGHT BREAST LUMPECTOMY WITH RADIOACTIVE SEED LOCALIZATION X 2 (Right Breast)  Patient Location: PACU  Anesthesia Type:General  Level of Consciousness: awake, alert , oriented, drowsy and patient cooperative  Airway & Oxygen Therapy: Patient Spontanous Breathing and Patient connected to face mask oxygen  Post-op Assessment: Report given to RN and Post -op Vital signs reviewed and stable  Post vital signs: Reviewed and stable  Last Vitals:  Vitals Value Taken Time  BP    Temp    Pulse    Resp    SpO2      Last Pain:  Vitals:   05/02/20 1128  TempSrc: Oral  PainSc: 0-No pain      Patients Stated Pain Goal: 3 (60/47/99 8721)  Complications: No complications documented.

## 2020-05-02 NOTE — Discharge Instructions (Addendum)
[place] Office Phone Number [phone]  BREAST BIOPSY/ PARTIAL MASTECTOMY: POST OP INSTRUCTIONS  Always review your discharge instruction sheet given to you by the facility where your surgery was performed.  IF YOU HAVE DISABILITY OR FAMILY LEAVE FORMS, YOU MUST BRING THEM TO THE OFFICE FOR PROCESSING.  DO NOT GIVE THEM TO YOUR DOCTOR.  1. A prescription for pain medication may be given to you upon discharge.  Take your pain medication as prescribed, if needed.  If narcotic pain medicine is not needed, then you may take acetaminophen (Tylenol) or ibuprofen (Advil) as needed. *You had 5 mg of Oxycodone at 4 pm  *You had 1000 mg of Tylenol at 11:30 am 2. Take your usually prescribed medications unless otherwise directed 3. If you need a refill on your pain medication, please contact your pharmacy.  They will contact our office to request authorization.  Prescriptions will not be filled after 5pm or on week-ends. 4. You should eat very light the first 24 hours after surgery, such as soup, crackers, pudding, etc.  Resume your normal diet the day after surgery. 5. Most patients will experience some swelling and bruising in the breast.  Ice packs and a good support bra will help.  Swelling and bruising can take several days to resolve.  6. It is common to experience some constipation if taking pain medication after surgery.  Increasing fluid intake and taking a stool softener will usually help or prevent this problem from occurring.  A mild laxative (Milk of Magnesia or Miralax) should be taken according to package directions if there are no bowel movements after 48 hours. 7. Unless discharge instructions indicate otherwise, you may remove your bandages 48 hours after surgery, and you may shower at that time.  You may have steri-strips (small skin tapes) in place directly over the incision.  These strips should be left on the skin for 7-10 days.   Any sutures or staples will be removed  at the office during your follow-up visit. 8. ACTIVITIES:  You may resume regular daily activities (gradually increasing) beginning the next day.  Wearing a good support bra or sports bra (or the breast binder) minimizes pain and swelling.  You may have sexual intercourse when it is comfortable. a. You may drive when you no longer are taking prescription pain medication, you can comfortably wear a seatbelt, and you can safely maneuver your car and apply brakes. b. RETURN TO WORK:  __________1 week_______________ 9. You should see your doctor in the office for a follow-up appointment approximately two weeks after your surgery.  Your doctor's nurse will typically make your follow-up appointment when she calls you with your pathology report.  Expect your pathology report 2-3 business days after your surgery.  You may call to check if you do not hear from [place] after three days.   WHEN TO CALL YOUR DOCTOR: 1. Fever over 101.0 2. Nausea and/or vomiting. 3. Extreme swelling or bruising. 4. Continued bleeding from incision. 5. Increased pain, redness, or drainage from the incision.  The clinic staff is available to answer your questions during regular business hours.  Please don't hesitate to call and ask to speak to one of the nurses for clinical concerns.  If you have a medical emergency, go to the nearest emergency room or call 911.  A surgeon from [facility] is always on call at the hospital.  For further questions, please visit [email or web address]    Post Anesthesia Home Care Instructions  Activity:  Get plenty of rest for the remainder of the day. A responsible individual must stay with you for 24 hours following the procedure.  For the next 24 hours, DO NOT: -Drive a car -Paediatric nurse -Drink alcoholic beverages -Take any medication unless instructed by your physician -Make any legal decisions or sign important papers.  Meals: Start with liquid foods such as  gelatin or soup. Progress to regular foods as tolerated. Avoid greasy, spicy, heavy foods. If nausea and/or vomiting occur, drink only clear liquids until the nausea and/or vomiting subsides. Call your physician if vomiting continues.  Special Instructions/Symptoms: Your throat may feel dry or sore from the anesthesia or the breathing tube placed in your throat during surgery. If this causes discomfort, gargle with warm salt water. The discomfort should disappear within 24 hours.  If you had a scopolamine patch placed behind your ear for the management of post- operative nausea and/or vomiting:  1. The medication in the patch is effective for 72 hours, after which it should be removed.  Wrap patch in a tissue and discard in the trash. Wash hands thoroughly with soap and water. 2. You may remove the patch earlier than 72 hours if you experience unpleasant side effects which may include dry mouth, dizziness or visual disturbances. 3. Avoid touching the patch. Wash your hands with soap and water after contact with the patch.

## 2020-05-02 NOTE — Anesthesia Postprocedure Evaluation (Signed)
Anesthesia Post Note  Patient: Kimberly Klein  Procedure(s) Performed: RIGHT BREAST LUMPECTOMY WITH RADIOACTIVE SEED LOCALIZATION X 2 (Right Breast)     Patient location during evaluation: PACU Anesthesia Type: General Level of consciousness: sedated and patient cooperative Pain management: pain level controlled Vital Signs Assessment: post-procedure vital signs reviewed and stable Respiratory status: spontaneous breathing Cardiovascular status: stable Anesthetic complications: no   No complications documented.  Last Vitals:  Vitals:   05/02/20 1545 05/02/20 1600  BP: 127/79 (!) (P) 141/89  Pulse: 88 (P) 87  Resp: 15   Temp:  (P) 36.7 C  SpO2: 97% (P) 98%    Last Pain:  Vitals:   05/02/20 1557  TempSrc:   PainSc: [place]

## 2020-05-03 ENCOUNTER — Encounter (HOSPITAL_BASED_OUTPATIENT_CLINIC_OR_DEPARTMENT_OTHER): Payer: Self-pay | Admitting: General Surgery

## 2020-05-04 LAB — SURGICAL PATHOLOGY

## 2020-05-08 ENCOUNTER — Encounter: Payer: Self-pay | Admitting: *Deleted

## 2020-05-08 ENCOUNTER — Other Ambulatory Visit: Payer: Self-pay | Admitting: General Surgery

## 2020-05-08 ENCOUNTER — Telehealth: Payer: Self-pay | Admitting: General Surgery

## 2020-05-08 NOTE — Telephone Encounter (Signed)
Discussed reexcision with patient.  Will plan reexcision and implant removal on right.

## 2020-05-09 ENCOUNTER — Encounter: Payer: Self-pay | Admitting: Radiation Oncology

## 2020-05-28 ENCOUNTER — Other Ambulatory Visit: Payer: Self-pay | Admitting: Family Medicine

## 2020-05-28 NOTE — Telephone Encounter (Signed)
Requested Prescriptions  Pending Prescriptions Disp Refills  . lisinopril (ZESTRIL) 20 MG tablet [Pharmacy Med Name: LISINOPRIL 20MG  TABLETS] 90 tablet 1    Sig: TAKE 1 TABLET(20 MG) BY MOUTH DAILY     Cardiovascular:  ACE Inhibitors Passed - 05/28/2020 10:26 AM      Passed - Cr in normal range and within 180 days    Creatinine  Date Value Ref Range Status  04/04/2020 0.81 0.44 - 1.00 mg/dL Final  07/18/2013 0.94 0.60 - 1.30 mg/dL Final         Passed - K in normal range and within 180 days    Potassium  Date Value Ref Range Status  04/04/2020 4.0 3.5 - 5.1 mmol/L Final  07/18/2013 4.1 3.5 - 5.1 mmol/L Final         Passed - Patient is not pregnant      Passed - Last BP in normal range    BP Readings from Last 1 Encounters:  05/02/20 (!) (P) 141/89         Passed - Valid encounter within last 6 months    Recent Outpatient Visits          2 months ago Primary hypertension   [facility] [place], Megan P, DO   11 months ago Essential hypertension   [place], [place], DO   1 year ago Essential hypertension   [place], [place] P, DO   1 year ago Acute nonintractable headache, unspecified headache type   [facility], Megan P, DO   3 years ago Elevated blood pressure reading in office with white coat syndrome, without diagnosis of hypertension   [place], Barb Merino, DO      Future Appointments            In 1 month Magrin[place], Virgie Dad, MD [facility]   In 3 months Wynetta Emery, Barb Merino, DO [facility]

## 2020-05-29 ENCOUNTER — Other Ambulatory Visit: Payer: Self-pay

## 2020-05-29 ENCOUNTER — Encounter: Payer: Self-pay | Admitting: Plastic Surgery

## 2020-05-29 ENCOUNTER — Ambulatory Visit: Payer: PPO | Admitting: Plastic Surgery

## 2020-05-29 ENCOUNTER — Ambulatory Visit: Payer: Self-pay | Attending: Internal Medicine

## 2020-05-29 VITALS — BP 148/85 | HR 82 | Ht 62.0 in | Wt 180.0 lb

## 2020-05-29 DIAGNOSIS — Z23 Encounter for immunization: Secondary | ICD-10-CM

## 2020-05-29 DIAGNOSIS — D0511 Intraductal carcinoma in situ of right breast: Secondary | ICD-10-CM

## 2020-05-29 MED ORDER — PFIZER-BIONT COVID-19 VAC-TRIS 30 MCG/0.3ML IM SUSP
INTRAMUSCULAR | 0 refills | Status: DC
  Filled 2020-05-29: qty 0.3, 1d supply, fill #0

## 2020-05-29 NOTE — Progress Notes (Addendum)
Patient ID: Kimberly Klein, female    DOB: [birth year], 70 y.o.   MRN: [id number]   Chief Complaint  Patient presents with   consult   Breast Problem    The patient is a 70 year old female here for a consultation for breast reconstruction.  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  It is estrogen positive and progesterone negative.  She underwent a right partial mastectomy.  More tissue is needed due to the proximity and involvement of the breast implant capsule..  She had implants placed 20 years ago by Dr. Wendy Poet.  She believes that they are submuscular and saline.  She is not sure how big they are.  She is 5 feet 2 inches tall and weighs 180 pounds.  She does not have diabetes and she is not on blood thinners.  She quit smoking many years ago.  She has also undergone an appendectomy and eye surgery in the past.  She has hypertension and a history of breast cancer she knows that more tissue is needed and that the implants need to be removed and she has resolved to understand that new implants will not be placed.  It looks like that the incision is circumareolar for when the implants were placed.  She may have had a slight lift at the time.  It is a little hard to tell.  But she does seem to have some breast tissue which will be leaving her with may be a B-sized breast   Review of Systems  Constitutional: Negative for activity change and appetite change.  HENT: Negative.   Eyes: Negative.   Respiratory: Negative.  Negative for chest tightness and shortness of breath.   Cardiovascular: Negative for chest pain.  Gastrointestinal: Negative.   Endocrine: Negative.   Genitourinary: Negative.   Musculoskeletal: Negative.   Skin: Negative.   Neurological: Negative.   Hematological: Negative.   Psychiatric/Behavioral: Negative.     Past Medical History:  Diagnosis Date   Breast cancer ([place]) 3 /22    Hypertension 2019   SVT (supraventricular tachycardia) ([facility]) 07/2013   strees test- normal    Past Surgical History:  Procedure Laterality Date   appendectomy     APPENDECTOMY  1921   AUGMENTATION MAMMAPLASTY Bilateral    BREAST LUMPECTOMY WITH RADIOACTIVE SEED LOCALIZATION Right 05/02/2020   Procedure: RIGHT BREAST LUMPECTOMY WITH RADIOACTIVE SEED LOCALIZATION X 2;  Surgeon: [name], MD;  Location: [place];  Service: General;  Laterality: Right;   EYE SURGERY  2022      Current Outpatient Medications:    amLODipine (NORVASC) 5 MG tablet, TAKE 1 TABLET(5 MG) BY MOUTH DAILY, Disp: 90 tablet, Rfl: 1   lisinopril (ZESTRIL) 20 MG tablet, TAKE 1 TABLET(20 MG) BY MOUTH DAILY, Disp: 90 tablet, Rfl: 1   Multiple Vitamins-Minerals (PRESERVISION AREDS 2+MULTI VIT PO), Take by mouth., Disp: , Rfl:    valACYclovir (VALTREX) 1000 MG tablet, Take 1 tablet (1,000 mg total) by mouth 2 (two) times daily., Disp: 20 tablet, Rfl: 6   oxyCODONE (OXY IR/ROXICODONE) 5 MG immediate release tablet, Take 1 tablet (5 mg total) by mouth every 6 (six) hours as needed for severe pain., Disp: 10 tablet, Rfl: 0   Objective:   Vitals:   05/29/20 0921  BP: (!) 148/85  Pulse: 82  SpO2: 96%    Physical Exam Vitals and nursing note reviewed.  Constitutional:  Appearance: Normal appearance.  HENT:     Head: Normocephalic and atraumatic.  Eyes:     Pupils: Pupils are equal, round, and reactive to light.  Cardiovascular:     Rate and Rhythm: Normal rate.     Pulses: Normal pulses.  Pulmonary:     Effort: Pulmonary effort is normal. No respiratory distress.  Chest:    Abdominal:     General: Abdomen is flat. There is no distension.  Musculoskeletal:        General: No swelling or deformity. Normal range of motion.     Cervical back: Normal range of motion.  Skin:    General: Skin is warm.     Coloration: Skin is not jaundiced.  Neurological:     General: No focal deficit  present.     Mental Status: She is alert and oriented to person, place, and time.  Psychiatric:        Mood and Affect: Mood normal.        Behavior: Behavior normal.     Assessment & Plan:  Ductal carcinoma in situ (DCIS) of right breast  The options for reconstruction we explained to the patient / family for breast reconstruction.  There are two general categories of reconstruction.  We can reconstruction a breast with implants or use the patient's own tissue.  These were further discussed as listed.  Breast reconstruction is an optional procedure and eligibility depends on the full spectrum of the health of the patient and any co-morbidities.  More than one surgery is often needed to complete the reconstruction process.  The process can take three to twelve months to complete.  The breasts will not be identical due to many factors such as rib differences, shoulder asymmetry and treatments such as radiation.  The goal is to get the breasts to look normal and symmetrical in clothes.  Scars are a part of surgery and may fade some in time but will always be present under clothes.  Surgery may be an option on the non-cancer breast to achieve more symmetry.  No matter which procedure is chosen there is always the risk of complications and even failure of the body to heal.  This could result in no breast.    The options for reconstruction include:  1. Placement of a tissue expander with Acellular dermal matrix. When the expander is the desired size surgery is performed to remove the expander and place an implant.  In some cases the implant can be placed without an expander.  2. Autologous reconstruction can include using a muscle or tissue from another area of the body to create a breast.  Since a full mastectomy is the plan this was merely mentioned. 3. Combined procedures (ie. latissismus dorsi flap) can be done with an expander / implant placed under the muscle.   The risks, benefits, scars and  recovery time were discussed for each of the above. Risks include bleeding, infection, hematoma, seroma, scarring, pain, wound healing complications, flap loss, fat necrosis, capsular contracture, need for implant removal, donor site complications, bulge, hernia, umbilical necrosis, need for urgent reoperation, and need for dressing changes.   The procedure the patient selected / that was best for the patient, was then discussed in further detail.  Total time: 50 minutes. This includes time spent with the patient during the visit as well as time spent before and after the visit reviewing the chart, documenting the encounter, making phone calls and reviewing studies.   The patient is  a good candidate for removal of the implants with a mastopexy so that she can then go on to have her radiation  [place], DO

## 2020-06-04 ENCOUNTER — Ambulatory Visit: Payer: PPO | Admitting: Radiation Oncology

## 2020-06-04 ENCOUNTER — Ambulatory Visit: Payer: PPO

## 2020-06-04 ENCOUNTER — Encounter: Payer: Self-pay | Admitting: *Deleted

## 2020-06-12 ENCOUNTER — Telehealth: Payer: Self-pay | Admitting: Oncology

## 2020-06-12 NOTE — Telephone Encounter (Signed)
Scheduled appointment per provider. Left a detailed message

## 2020-06-14 ENCOUNTER — Telehealth: Payer: PPO | Admitting: Physician Assistant

## 2020-06-14 DIAGNOSIS — M5431 Sciatica, right side: Secondary | ICD-10-CM

## 2020-06-14 MED ORDER — TIZANIDINE HCL 2 MG PO TABS: 2.0000 mg | ORAL_TABLET | Freq: Three times a day (TID) | ORAL | 0 refills | Status: DC | PRN

## 2020-06-14 MED ORDER — METHYLPREDNISOLONE 4 MG PO TBPK: ORAL_TABLET | ORAL | 0 refills | Status: DC

## 2020-06-14 NOTE — Progress Notes (Signed)
We are sorry that you are not feeling well.  Here is how we plan to help!  Based on what you have shared with me it looks like you mostly have acute back pain.  Acute back pain is defined as musculoskeletal pain that can resolve in 1-3 weeks with conservative treatment.  I have prescribed Medrol (methylprednisolone) dose pak, as well as Tizanidine 2 mg every eight hours as needed which is a muscle relaxer  Some patients experience stomach irritation or in increased heartburn with anti-inflammatory drugs.  Please keep in mind that muscle relaxer's can cause fatigue and should not be taken while at work or driving.  Back pain is very common.  The pain often gets better over time.  The cause of back pain is usually not dangerous.  Most people can learn to manage their back pain on their own.  Home Care  Stay active.  Start with short walks on flat ground if you can.  Try to walk farther each day.  Do not sit, drive or stand in one place for more than 30 minutes.  Do not stay in bed.  Do not avoid exercise or work.  Activity can help your back heal faster.  Be careful when you bend or lift an object.  Bend at your knees, keep the object close to you, and do not twist.  Sleep on a firm mattress.  Lie on your side, and bend your knees.  If you lie on your back, put a pillow under your knees.  Only take medicines as told by your doctor.  Put ice on the injured area.  Put ice in a plastic bag  Place a towel between your skin and the bag  Leave the ice on for 15-20 minutes, 3-4 times a day for the first 2-3 days. 210 After that, you can switch between ice and heat packs.  Ask your doctor about back exercises or massage.  Avoid feeling anxious or stressed.  Find good ways to deal with stress, such as exercise.  Get Help Right Way If:  Your pain does not go away with rest or medicine.  Your pain does not go away in 1 week.  You have new problems.  You do not feel well.  The pain  spreads into your legs.  You cannot control when you poop (bowel movement) or pee (urinate)  You feel sick to your stomach (nauseous) or throw up (vomit)  You have belly (abdominal) pain.  You feel like you may pass out (faint).  If you develop a fever.  Make Sure you:  Understand these instructions.  Will watch your condition  Will get help right away if you are not doing well or get worse.  Your e-visit answers were reviewed by a board certified advanced clinical practitioner to complete your personal care plan.  Depending on the condition, your plan could have included both over the counter or prescription medications.  If there is a problem please reply  once you have received a response from your provider.  Your safety is important to [place].  If you have drug allergies check your prescription carefully.    You can use MyChart to ask questions about today's visit, request a non-urgent call back, or ask for a work or school excuse for 24 hours related to this e-Visit. If it has been greater than 24 hours you will need to follow up with your provider, or enter a new e-Visit to address those concerns.  You will  get an e-mail in the next two days asking about your experience.  I hope that your e-visit has been valuable and will speed your recovery. Thank you for using e-visits.  I provided 5 minutes of non face-to-face time during this encounter for chart review and documentation.

## 2020-06-20 ENCOUNTER — Encounter: Payer: Self-pay | Admitting: Surgical

## 2020-06-20 ENCOUNTER — Ambulatory Visit (INDEPENDENT_AMBULATORY_CARE_PROVIDER_SITE_OTHER): Payer: PPO | Admitting: Surgical

## 2020-06-20 ENCOUNTER — Other Ambulatory Visit: Payer: Self-pay

## 2020-06-20 VITALS — BP 150/89 | HR 81 | Ht 62.0 in | Wt 183.6 lb

## 2020-06-20 DIAGNOSIS — D0511 Intraductal carcinoma in situ of right breast: Secondary | ICD-10-CM

## 2020-06-20 MED ORDER — CEPHALEXIN 500 MG PO CAPS: 500.0000 mg | ORAL_CAPSULE | Freq: Four times a day (QID) | ORAL | 0 refills | Status: AC

## 2020-06-20 MED ORDER — ONDANSETRON HCL 4 MG PO TABS: 4.0000 mg | ORAL_TABLET | Freq: Three times a day (TID) | ORAL | 0 refills | Status: DC | PRN

## 2020-06-20 MED ORDER — HYDROCODONE-ACETAMINOPHEN 5-325 MG PO TABS: 1.0000 | ORAL_TABLET | Freq: Four times a day (QID) | ORAL | 0 refills | Status: AC | PRN

## 2020-06-20 NOTE — H&P (View-Only) (Signed)
Patient ID: ZOHA SPRANGER, female    DOB: 24-Dec-1950, 70 y.o.   MRN: [id number]  Chief Complaint  Patient presents with  . Pre-op Exam      ICD-10-CM   1. Ductal carcinoma in situ (DCIS) of right breast  D05.11      History of Present Illness: ELMIRE AMREIN is a 70 y.o.  female  with a history of right breast ductal carcinoma and current bilateral breast implants in place.  She presents for preoperative evaluation for upcoming procedure, immediate bilateral mastopexy and removal of bilateral breast implants with complete capsulectomy with Dr. Marla Roe in conjunction with reexcision of right breast lumpectomy with Dr. Barry Dienes on 07/11/2020  The patient has not had problems with anesthesia. No history of DVT/PE.  No family history of DVT/PE.  No family or personal history of bleeding or clotting disorders.  Patient is not currently taking any blood thinners.  No history of CVA/MI.   Summary of Previous Visit: Patient with a recent diagnosis of right breast ductal carcinoma in situ and necrosis and calcifications in the lower outer quadrant.  She underwent a right partial mastectomy, more tissue is needed due to proximity and involvement of the breast implant capsule.  She had implants placed 20 years ago by Dr. Wendy Poet and she believes they are submuscular and saline.  She reports to me today**that she had the right breast implant replaced approximately 6 years ago.  She is not on blood thinners.  She is not a diabetic.  She is not a smoker at this time.  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  amLODipine (NORVASC) 5 MG tablet, TAKE 1 TABLET(5 MG) BY MOUTH DAILY, Disp: 90 tablet, Rfl: 1 .  cephALEXin (KEFLEX) 500 MG capsule, Take 1 capsule (500 mg total) by mouth 4 (four) times daily for 3 days.,  Disp: 12 capsule, Rfl: 0 .  HYDROcodone-acetaminophen (NORCO) 5-325 MG tablet, Take 1 tablet by mouth every 6 (six) hours as needed for up to 5 days for severe pain., Disp: 20 tablet, Rfl: 0 .  lisinopril (ZESTRIL) 20 MG tablet, TAKE 1 TABLET(20 MG) BY MOUTH DAILY, Disp: 90 tablet, Rfl: 1 .  Multiple Vitamins-Minerals (PRESERVISION AREDS 2+MULTI VIT PO), Take by mouth., Disp: , Rfl:  .  ondansetron (ZOFRAN) 4 MG tablet, Take 1 tablet (4 mg total) by mouth every 8 (eight) hours as needed for nausea or vomiting., Disp: 20 tablet, Rfl: 0 .  tiZANidine (ZANAFLEX) 2 MG tablet, Take 1 tablet (2 mg total) by mouth every 8 (eight) hours as needed for muscle spasms., Disp: 30 tablet, Rfl: 0 .  valACYclovir (VALTREX) 1000 MG tablet, Take 1 tablet (1,000 mg total) by mouth 2 (two) times daily., Disp: 20 tablet, Rfl: 6 .  methylPREDNISolone (MEDROL) 4 MG TBPK tablet, 6 day taper; take as directed on package instructions, Disp: 21 tablet, Rfl: 0  Past Medical Problems: Past Medical History:  Diagnosis Date  . Breast cancer ([place]) 3 /22  . Hypertension 2019  . SVT (supraventricular tachycardia) ([place]) 07/2013   strees test- normal    Past Surgical History: Past Surgical History:  Procedure Laterality Date  . appendectomy    . APPENDECTOMY  1921  . AUGMENTATION MAMMAPLASTY Bilateral   . BREAST LUMPECTOMY WITH RADIOACTIVE SEED LOCALIZATION Right 05/02/2020   Procedure:  RIGHT BREAST LUMPECTOMY WITH RADIOACTIVE SEED LOCALIZATION X 2;  Surgeon: [name], MD;  Location: [facility];  Service: General;  Laterality: Right;  . EYE SURGERY  2022    Social History: Social History   Socioeconomic History  . Marital status: Widowed    Spouse name: Not on file  . Number of children: Not on file  . Years of education: Not on file  . Highest education level: Not on file  Occupational History  . Occupation: PRN RN   Tobacco Use  . Smoking status: Former Smoker    Packs/day: 0.00    Years:  20.00    Pack years: 0.00    Types: Cigarettes    Quit date: 10/03/2007    Years since quitting: 12.7  . Smokeless tobacco: Never Used  Vaping Use  . Vaping Use: Never used  Substance and Sexual Activity  . Alcohol use: Yes    Alcohol/week: 4.0 - 5.0 standard drinks    Types: 4 - 5 Glasses of wine per week    Comment: occasional  . Drug use: No  . Sexual activity: Not Currently    Birth control/protection: Post-menopausal  Other Topics Concern  . Not on file  Social History Narrative  . Not on file   Social Determinants of Health   Financial Resource Strain: Not on file  Food Insecurity: Not on file  Transportation Needs: Not on file  Physical Activity: Not on file  Stress: Not on file  Social Connections: Not on file  Intimate Partner Violence: Not on file    Family History: Family History  Problem Relation Age of Onset  . Hypertension Father   . Hyperlipidemia Father   . Heart attack Father   . Stroke Father   . Bladder Cancer Brother   . Liver cancer Sister   . Multiple myeloma Brother     Review of Systems: Review of Systems  Constitutional: Negative.   Respiratory: Negative.   Cardiovascular: Negative.   Gastrointestinal: Negative.   Musculoskeletal: Negative.   Neurological: Negative.     Physical Exam: Vital Signs BP (!) 150/89 (BP Location: Right Arm, Patient Position: Sitting, Cuff Size: Large)   Pulse 81   Ht 5' 2" (1.575 m)   Wt 183 lb 9.6 oz (83.3 kg)   SpO2 97%   BMI 33.58 kg/m   Physical Exam  Constitutional:      General: Not in acute distress.    Appearance: Normal appearance. Not ill-appearing.  HENT:     Head: Normocephalic and atraumatic.  Eyes:     Pupils: Pupils are equal, round Neck:     Musculoskeletal: Normal range of motion.  Cardiovascular:     Rate and Rhythm: Normal rate    Pulses: Normal pulses.  Pulmonary:     Effort: Pulmonary effort is normal. No respiratory distress.  Abdominal:     General: Abdomen is  flat. There is no distension.  Musculoskeletal: Normal range of motion.  Skin:    General: Skin is warm and dry.     Findings: No erythema or rash.  Neurological:     General: No focal deficit present.     Mental Status: Alert and oriented to person, place, and time. Mental status is at baseline.     Motor: No weakness.  Psychiatric:        Mood and Affect: Mood normal.        Behavior: Behavior normal.    Assessment/Plan: The patient is scheduled for  bilateral mastopexy and removal of bilateral breast implants and Complete capsulectomy with Dr. Dillingham in conjunction with right breast lumpectomy reexcision with Dr. Byerly.  Risks, benefits, and alternatives of procedure discussed, questions answered and consent obtained.    Smoking Status: Non-smoker; Counseling Given?  N/A Last Mammogram: 02/24/2020; Results: Suspicious lower outer right breast calcifications.  Indeterminant 1 cm upper left breast mass.  Patient with subsequent right breast partial mastectomy  Caprini Score: 7, high; Risk Factors include: Age, BMI greater than 25, current breast cancer and length of planned surgery. Recommendation for mechanical and pharmacological prophylaxis. Encourage early ambulation.   Pictures obtained: 05/29/2020  Post-op Rx sent to pharmacy: Norco, Zofran, Keflex  Patient was provided with the General Surgical Risk consent document and Pain Medication Agreement prior to their appointment.  They had adequate time to read through the risk consent documents and Pain Medication Agreement. We also discussed them in person together during this preop appointment. All of their questions were answered to their satisfaction.  Recommended calling if they have any further questions.  Risk consent form and Pain Medication Agreement to be scanned into patient's chart.  The risk that can be encountered with mastopexy were discussed and include the following but not limited to these:  Breast asymmetry, fluid  accumulation, firmness of the breast, inability to breast feed, loss of nipple or areola, skin loss, decrease or no nipple sensation, fat necrosis of the breast tissue, bleeding, infection, healing delay.  There are risks of anesthesia, changes to skin sensation and injury to nerves or blood vessels.  The muscle can be temporarily or permanently injured.  You may have an allergic reaction to tape, suture, glue, blood products which can result in skin discoloration, swelling, pain, skin lesions, poor healing.  Any of these can lead to the need for revisonal surgery or stage procedures.   Patient is aware of the need for possible postoperative JP drains.   Electronically signed by: Elexius Minar J Bethaney Oshana, PA-C 06/20/2020 3:45 PM 

## 2020-06-20 NOTE — Progress Notes (Signed)
Patient ID: Kimberly Klein, female    DOB: Oct 30, 1950, 70 y.o.   MRN: [id number]  Chief Complaint  Patient presents with  . Pre-op Exam      ICD-10-CM   1. Ductal carcinoma in situ (DCIS) of right breast  D05.11      History of Present Illness: Kimberly Klein is a 70 y.o.  female  with a history of right breast ductal carcinoma and current bilateral breast implants in place.  She presents for preoperative evaluation for upcoming procedure, immediate bilateral mastopexy and removal of bilateral breast implants with complete capsulectomy with Dr. Marla Roe in conjunction with reexcision of right breast lumpectomy with Dr. Barry Dienes on 07/11/2020  The patient has not had problems with anesthesia. No history of DVT/PE.  No family history of DVT/PE.  No family or personal history of bleeding or clotting disorders.  Patient is not currently taking any blood thinners.  No history of CVA/MI.   Summary of Previous Visit: Patient with a recent diagnosis of right breast ductal carcinoma in situ and necrosis and calcifications in the lower outer quadrant.  She underwent a right partial mastectomy, more tissue is needed due to proximity and involvement of the breast implant capsule.  She had implants placed 20 years ago by Dr. Wendy Poet and she believes they are submuscular and saline.  She reports to me today**that she had the right breast implant replaced approximately 6 years ago.  She is not on blood thinners.  She is not a diabetic.  She is not a smoker at this time.  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  amLODipine (NORVASC) 5 MG tablet, TAKE 1 TABLET(5 MG) BY MOUTH DAILY, Disp: 90 tablet, Rfl: 1 .  cephALEXin (KEFLEX) 500 MG capsule, Take 1 capsule (500 mg total) by mouth 4 (four) times daily for 3 days.,  Disp: 12 capsule, Rfl: 0 .  HYDROcodone-acetaminophen (NORCO) 5-325 MG tablet, Take 1 tablet by mouth every 6 (six) hours as needed for up to 5 days for severe pain., Disp: 20 tablet, Rfl: 0 .  lisinopril (ZESTRIL) 20 MG tablet, TAKE 1 TABLET(20 MG) BY MOUTH DAILY, Disp: 90 tablet, Rfl: 1 .  Multiple Vitamins-Minerals (PRESERVISION AREDS 2+MULTI VIT PO), Take by mouth., Disp: , Rfl:  .  ondansetron (ZOFRAN) 4 MG tablet, Take 1 tablet (4 mg total) by mouth every 8 (eight) hours as needed for nausea or vomiting., Disp: 20 tablet, Rfl: 0 .  tiZANidine (ZANAFLEX) 2 MG tablet, Take 1 tablet (2 mg total) by mouth every 8 (eight) hours as needed for muscle spasms., Disp: 30 tablet, Rfl: 0 .  valACYclovir (VALTREX) 1000 MG tablet, Take 1 tablet (1,000 mg total) by mouth 2 (two) times daily., Disp: 20 tablet, Rfl: 6 .  methylPREDNISolone (MEDROL) 4 MG TBPK tablet, 6 day taper; take as directed on package instructions, Disp: 21 tablet, Rfl: 0  Past Medical Problems: Past Medical History:  Diagnosis Date  . Breast cancer ([place]) 3 /22  . Hypertension 2019  . SVT (supraventricular tachycardia) ([place]) 07/2013   strees test- normal    Past Surgical History: Past Surgical History:  Procedure Laterality Date  . appendectomy    . APPENDECTOMY  1921  . AUGMENTATION MAMMAPLASTY Bilateral   . BREAST LUMPECTOMY WITH RADIOACTIVE SEED LOCALIZATION Right 05/02/2020   Procedure:  RIGHT BREAST LUMPECTOMY WITH RADIOACTIVE SEED LOCALIZATION X 2;  Surgeon: [name], MD;  Location: [facility];  Service: General;  Laterality: Right;  . EYE SURGERY  2022    Social History: Social History   Socioeconomic History  . Marital status: Widowed    Spouse name: Not on file  . Number of children: Not on file  . Years of education: Not on file  . Highest education level: Not on file  Occupational History  . Occupation: PRN RN   Tobacco Use  . Smoking status: Former Smoker    Packs/day: 0.00    Years:  20.00    Pack years: 0.00    Types: Cigarettes    Quit date: 10/03/2007    Years since quitting: 12.7  . Smokeless tobacco: Never Used  Vaping Use  . Vaping Use: Never used  Substance and Sexual Activity  . Alcohol use: Yes    Alcohol/week: 4.0 - 5.0 standard drinks    Types: 4 - 5 Glasses of wine per week    Comment: occasional  . Drug use: No  . Sexual activity: Not Currently    Birth control/protection: Post-menopausal  Other Topics Concern  . Not on file  Social History Narrative  . Not on file   Social Determinants of Health   Financial Resource Strain: Not on file  Food Insecurity: Not on file  Transportation Needs: Not on file  Physical Activity: Not on file  Stress: Not on file  Social Connections: Not on file  Intimate Partner Violence: Not on file    Family History: Family History  Problem Relation Age of Onset  . Hypertension Father   . Hyperlipidemia Father   . Heart attack Father   . Stroke Father   . Bladder Cancer Brother   . Liver cancer Sister   . Multiple myeloma Brother     Review of Systems: Review of Systems  Constitutional: Negative.   Respiratory: Negative.   Cardiovascular: Negative.   Gastrointestinal: Negative.   Musculoskeletal: Negative.   Neurological: Negative.     Physical Exam: Vital Signs BP (!) 150/89 (BP Location: Right Arm, Patient Position: Sitting, Cuff Size: Large)   Pulse 81   Ht 5' 2" (1.575 m)   Wt 183 lb 9.6 oz (83.3 kg)   SpO2 97%   BMI 33.58 kg/m   Physical Exam  Constitutional:      General: Not in acute distress.    Appearance: Normal appearance. Not ill-appearing.  HENT:     Head: Normocephalic and atraumatic.  Eyes:     Pupils: Pupils are equal, round Neck:     Musculoskeletal: Normal range of motion.  Cardiovascular:     Rate and Rhythm: Normal rate    Pulses: Normal pulses.  Pulmonary:     Effort: Pulmonary effort is normal. No respiratory distress.  Abdominal:     General: Abdomen is  flat. There is no distension.  Musculoskeletal: Normal range of motion.  Skin:    General: Skin is warm and dry.     Findings: No erythema or rash.  Neurological:     General: No focal deficit present.     Mental Status: Alert and oriented to person, place, and time. Mental status is at baseline.     Motor: No weakness.  Psychiatric:        Mood and Affect: Mood normal.        Behavior: Behavior normal.    Assessment/Plan: The patient is scheduled for  bilateral mastopexy and removal of bilateral breast implants and Complete capsulectomy with Dr. Marla Roe in conjunction with right breast lumpectomy reexcision with Dr. Barry Dienes.  Risks, benefits, and alternatives of procedure discussed, questions answered and consent obtained.    Smoking Status: Non-smoker; Counseling Given?  N/A Last Mammogram: 02/24/2020; Results: Suspicious lower outer right breast calcifications.  Indeterminant 1 cm upper left breast mass.  Patient with subsequent right breast partial mastectomy  Caprini Score: 7, high; Risk Factors include: Age, BMI greater than 25, current breast cancer and length of planned surgery. Recommendation for mechanical and pharmacological prophylaxis. Encourage early ambulation.   Pictures obtained: 05/29/2020  Post-op Rx sent to pharmacy: Norco, Zofran, Keflex  Patient was provided with the General Surgical Risk consent document and Pain Medication Agreement prior to their appointment.  They had adequate time to read through the risk consent documents and Pain Medication Agreement. We also discussed them in person together during this preop appointment. All of their questions were answered to their satisfaction.  Recommended calling if they have any further questions.  Risk consent form and Pain Medication Agreement to be scanned into patient's chart.  The risk that can be encountered with mastopexy were discussed and include the following but not limited to these:  Breast asymmetry, fluid  accumulation, firmness of the breast, inability to breast feed, loss of nipple or areola, skin loss, decrease or no nipple sensation, fat necrosis of the breast tissue, bleeding, infection, healing delay.  There are risks of anesthesia, changes to skin sensation and injury to nerves or blood vessels.  The muscle can be temporarily or permanently injured.  You may have an allergic reaction to tape, suture, glue, blood products which can result in skin discoloration, swelling, pain, skin lesions, poor healing.  Any of these can lead to the need for revisonal surgery or stage procedures.   Patient is aware of the need for possible postoperative JP drains.   Electronically signed by: Carola Rhine Scheeler, PA-C 06/20/2020 3:45 PM

## 2020-06-24 ENCOUNTER — Other Ambulatory Visit: Payer: Self-pay | Admitting: Family Medicine

## 2020-06-24 ENCOUNTER — Other Ambulatory Visit: Payer: Self-pay | Admitting: Physician Assistant

## 2020-06-24 DIAGNOSIS — M5431 Sciatica, right side: Secondary | ICD-10-CM

## 2020-06-24 NOTE — Telephone Encounter (Signed)
Last RF 03/15/20 #90 1 RF

## 2020-06-25 ENCOUNTER — Other Ambulatory Visit: Payer: Self-pay | Admitting: Physician Assistant

## 2020-06-25 DIAGNOSIS — M5431 Sciatica, right side: Secondary | ICD-10-CM

## 2020-06-28 ENCOUNTER — Ambulatory Visit (INDEPENDENT_AMBULATORY_CARE_PROVIDER_SITE_OTHER): Payer: PPO | Admitting: Family Medicine

## 2020-06-28 ENCOUNTER — Other Ambulatory Visit: Payer: Self-pay | Admitting: Physician Assistant

## 2020-06-28 ENCOUNTER — Encounter (INDEPENDENT_AMBULATORY_CARE_PROVIDER_SITE_OTHER): Payer: Self-pay | Admitting: Family Medicine

## 2020-06-28 ENCOUNTER — Other Ambulatory Visit: Payer: Self-pay

## 2020-06-28 VITALS — BP 145/90 | HR 79 | Temp 97.9°F | Ht 62.0 in | Wt 179.0 lb

## 2020-06-28 DIAGNOSIS — M5431 Sciatica, right side: Secondary | ICD-10-CM

## 2020-06-28 DIAGNOSIS — M25561 Pain in right knee: Secondary | ICD-10-CM

## 2020-06-28 DIAGNOSIS — R0683 Snoring: Secondary | ICD-10-CM | POA: Diagnosis not present

## 2020-06-28 DIAGNOSIS — I471 Supraventricular tachycardia, unspecified: Secondary | ICD-10-CM

## 2020-06-28 DIAGNOSIS — R5383 Other fatigue: Secondary | ICD-10-CM | POA: Diagnosis not present

## 2020-06-28 DIAGNOSIS — Z87891 Personal history of nicotine dependence: Secondary | ICD-10-CM | POA: Diagnosis not present

## 2020-06-28 DIAGNOSIS — F3289 Other specified depressive episodes: Secondary | ICD-10-CM | POA: Diagnosis not present

## 2020-06-28 DIAGNOSIS — D0511 Intraductal carcinoma in situ of right breast: Secondary | ICD-10-CM

## 2020-06-28 DIAGNOSIS — E7800 Pure hypercholesterolemia, unspecified: Secondary | ICD-10-CM | POA: Diagnosis not present

## 2020-06-28 DIAGNOSIS — R0602 Shortness of breath: Secondary | ICD-10-CM

## 2020-06-28 DIAGNOSIS — I10 Essential (primary) hypertension: Secondary | ICD-10-CM | POA: Diagnosis not present

## 2020-06-28 DIAGNOSIS — Z0289 Encounter for other administrative examinations: Secondary | ICD-10-CM

## 2020-06-28 DIAGNOSIS — E66811 Obesity, class 1: Secondary | ICD-10-CM

## 2020-06-28 DIAGNOSIS — Z6832 Body mass index (BMI) 32.0-32.9, adult: Secondary | ICD-10-CM

## 2020-06-28 DIAGNOSIS — G8929 Other chronic pain: Secondary | ICD-10-CM

## 2020-06-28 DIAGNOSIS — E669 Obesity, unspecified: Secondary | ICD-10-CM | POA: Diagnosis not present

## 2020-06-28 DIAGNOSIS — R7301 Impaired fasting glucose: Secondary | ICD-10-CM

## 2020-06-28 DIAGNOSIS — H3530 Unspecified macular degeneration: Secondary | ICD-10-CM

## 2020-06-29 LAB — LIPID PANEL
Chol/HDL Ratio: 3.5 ratio (ref 0.0–4.4)
Cholesterol, Total: 294 mg/dL — ABNORMAL HIGH (ref 100–199)
HDL: 83 mg/dL (ref >39)
LDL Chol Calc (NIH): 193 mg/dL — ABNORMAL HIGH (ref 0–99)
Triglycerides: 105 mg/dL (ref 0–149)
VLDL Cholesterol Cal: 18 mg/dL (ref 5–40)

## 2020-06-29 LAB — HEMOGLOBIN A1C
Est. average glucose Bld gHb Est-mCnc: 117 mg/dL
Hgb A1c MFr Bld: 5.7 % — ABNORMAL HIGH (ref 4.8–5.6)

## 2020-06-29 LAB — CBC WITH DIFFERENTIAL/PLATELET
Basophils Absolute: 0 10*3/uL (ref 0.0–0.2)
Basos: 1 %
EOS (ABSOLUTE): 0 10*3/uL (ref 0.0–0.4)
Eos: 1 %
Hematocrit: 41.8 % (ref 34.0–46.6)
Hemoglobin: 13.9 g/dL (ref 11.1–15.9)
Immature Grans (Abs): 0.1 10*3/uL (ref 0.0–0.1)
Immature Granulocytes: 1 %
Lymphocytes Absolute: 1.6 10*3/uL (ref 0.7–3.1)
Lymphs: 29 %
MCH: 31.5 pg (ref 26.6–33.0)
MCHC: 33.3 g/dL (ref 31.5–35.7)
MCV: 95 fL (ref 79–97)
Monocytes Absolute: 0.7 10*3/uL (ref 0.1–0.9)
Monocytes: 12 %
Neutrophils Absolute: 3.1 10*3/uL (ref 1.4–7.0)
Neutrophils: 56 %
Platelets: 204 10*3/uL (ref 150–450)
RBC: 4.41 x10E6/uL (ref 3.77–5.28)
RDW: 11.8 % (ref 11.7–15.4)
WBC: 5.5 10*3/uL (ref 3.4–10.8)

## 2020-06-29 LAB — IRON,TIBC AND FERRITIN PANEL
Ferritin: 244 ng/mL — ABNORMAL HIGH (ref 15–150)
Iron Saturation: 34 % (ref 15–55)
Iron: 105 ug/dL (ref 27–139)
Total Iron Binding Capacity: 312 ug/dL (ref 250–450)
UIBC: 207 ug/dL (ref 118–369)

## 2020-06-29 LAB — COMPREHENSIVE METABOLIC PANEL
ALT: 17 IU/L (ref 0–32)
AST: 17 IU/L (ref 0–40)
Albumin/Globulin Ratio: 1.8 (ref 1.2–2.2)
Albumin: 4.5 g/dL (ref 3.7–4.7)
Alkaline Phosphatase: 99 IU/L (ref 44–121)
BUN/Creatinine Ratio: 12 (ref 12–28)
BUN: 11 mg/dL (ref 8–27)
Bilirubin Total: 0.5 mg/dL (ref 0.0–1.2)
CO2: 24 mmol/L (ref 20–29)
Calcium: 9.7 mg/dL (ref 8.7–10.3)
Chloride: 100 mmol/L (ref 96–106)
Creatinine, Ser: 0.92 mg/dL (ref 0.57–1.00)
Globulin, Total: 2.5 g/dL (ref 1.5–4.5)
Glucose: 105 mg/dL — ABNORMAL HIGH (ref 65–99)
Potassium: 4.8 mmol/L (ref 3.5–5.2)
Sodium: 139 mmol/L (ref 134–144)
Total Protein: 7 g/dL (ref 6.0–8.5)
eGFR: 67 mL/min/{1.73_m2} (ref >59)

## 2020-06-29 LAB — T4, FREE: Free T4: 1.15 ng/dL (ref 0.82–1.77)

## 2020-06-29 LAB — VITAMIN D 25 HYDROXY (VIT D DEFICIENCY, FRACTURES): Vit D, 25-Hydroxy: 17.5 ng/mL — ABNORMAL LOW (ref 30.0–100.0)

## 2020-06-29 LAB — TSH: TSH: 0.998 u[IU]/mL (ref 0.450–4.500)

## 2020-06-29 LAB — INSULIN, RANDOM: INSULIN: 9.1 u[IU]/mL (ref 2.6–24.9)

## 2020-07-02 ENCOUNTER — Telehealth: Payer: Self-pay | Admitting: Oncology

## 2020-07-02 NOTE — Telephone Encounter (Signed)
R/s appt per 6/20 sch msg. Pt requested to push appt out to September. Pt aware of date and time.

## 2020-07-03 ENCOUNTER — Telehealth: Payer: PPO | Admitting: Oncology

## 2020-07-03 NOTE — Progress Notes (Signed)
Surgical Instructions    Your procedure is scheduled on Wednesday June 29th.  Report to [facility] Main Entrance "A" at 11 A.M., then check in with the Admitting office.  Call this number if you have problems the morning of surgery:  [phone]   If you have any questions prior to your surgery date call [phone]: Open Monday-Friday 8am-4pm    Remember:  Do not eat after midnight the night before your surgery  You may drink clear liquids until 10am the morning of your surgery.   Clear liquids allowed are: Water, Non-Citrus Juices (without pulp), Carbonated Beverages, Clear Tea, Black Coffee Only, and Gatorade    Take these medicines the morning of surgery with A SIP OF WATER  amLODipine (NORVASC) 5 MG tablet   IF NEEDED ondansetron (ZOFRAN) 4 MG tablet valACYclovir (VALTREX) 1000 MG tablet   As of today, STOP taking any Aspirin (unless otherwise instructed by your surgeon) Aleve, Naproxen, Ibuprofen, Motrin, Advil, Goody's, BC's, all herbal medications, fish oil, and all vitamins.          Do not wear jewelry or makeup Do not wear lotions, powders, perfumes, or deodorant. Do not shave 48 hours prior to surgery.   Do not bring valuables to the hospital. DO Not wear nail polish, gel polish, artificial nails, or any other type of covering on natural nails including finger and toenails. If patients have artificial nails, gel coating, etc. that need to be removed by a nail salon please have this removed prior to surgery or surgery may need to be canceled/delayed if the surgeon/ anesthesia feels like the patient is unable to be adequately monitored.             [facility] is not responsible for any belongings or valuables.  Do NOT Smoke (Tobacco/Vaping) or drink Alcohol 24 hours prior to your procedure If you use a CPAP at night, you may bring all equipment for your overnight stay.   Contacts, glasses, dentures or bridgework may not be worn into surgery, please bring cases for  these belongings   For patients admitted to the hospital, discharge time will be determined by your treatment team.   Patients discharged the day of surgery will not be allowed to drive home, and someone needs to stay with them for 24 hours.  ONLY 1 SUPPORT PERSON MAY BE PRESENT WHILE YOU ARE IN SURGERY. IF YOU ARE TO BE ADMITTED ONCE YOU ARE IN YOUR ROOM YOU WILL BE ALLOWED TWO (2) VISITORS.  Minor children may have two parents present. Special consideration for safety and communication needs will be reviewed on a case by case basis.  Special instructions:    Oral Hygiene is also important to reduce your risk of infection.  Remember - BRUSH YOUR TEETH THE MORNING OF SURGERY WITH YOUR REGULAR TOOTHPASTE   [facility]- Preparing For Surgery  Before surgery, you can play an important role. Because skin is not sterile, your skin needs to be as free of germs as possible. You can reduce the number of germs on your skin by washing with CHG (chlorahexidine gluconate) Soap before surgery.  CHG is an antiseptic cleaner which kills germs and bonds with the skin to continue killing germs even after washing.     Please do not use if you have an allergy to CHG or antibacterial soaps. If your skin becomes reddened/irritated stop using the CHG.  Do not shave (including legs and underarms) for at least 48 hours prior to first CHG shower.  It is OK to shave your face.  Please follow these instructions carefully.     Shower the NIGHT BEFORE SURGERY and the MORNING OF SURGERY with CHG Soap.   If you chose to wash your hair, wash your hair first as usual with your normal shampoo. After you shampoo, rinse your hair and body thoroughly to remove the shampoo.  Then [employer] and genitals (private parts) with your normal soap and rinse thoroughly to remove soap.  After that Use CHG Soap as you would any other liquid soap. You can apply CHG directly to the skin and wash gently with a scrungie or a clean washcloth.    Apply the CHG Soap to your body ONLY FROM THE NECK DOWN.  Do not use on open wounds or open sores. Avoid contact with your eyes, ears, mouth and genitals (private parts). Wash Face and genitals (private parts)  with your normal soap.   Wash thoroughly, paying special attention to the area where your surgery will be performed.  Thoroughly rinse your body with warm water from the neck down.  DO NOT shower/wash with your normal soap after using and rinsing off the CHG Soap.  Pat yourself dry with a CLEAN TOWEL.  Wear CLEAN [place] to bed the night before surgery  Place CLEAN SHEETS on your bed the night before your surgery  DO NOT SLEEP WITH PETS.   Day of Surgery:  Take a shower with CHG soap. Wear Clean/Comfortable clothing the morning of surgery Do not apply any deodorants/lotions.   Remember to brush your teeth WITH YOUR REGULAR TOOTHPASTE.   Please read over the following fact sheets that you were given.

## 2020-07-04 ENCOUNTER — Encounter (HOSPITAL_COMMUNITY)
Admission: RE | Admit: 2020-07-04 | Discharge: 2020-07-04 | Disposition: A | Discharge status: Home / Self Care | Payer: PPO | Source: Ambulatory Visit | Attending: General Surgery | Admitting: General Surgery

## 2020-07-04 ENCOUNTER — Encounter (HOSPITAL_COMMUNITY): Payer: Self-pay

## 2020-07-04 ENCOUNTER — Other Ambulatory Visit: Payer: Self-pay

## 2020-07-04 HISTORY — DX: Cardiac arrhythmia, unspecified: I49.9

## 2020-07-04 HISTORY — DX: Unspecified macular degeneration: H35.30

## 2020-07-04 NOTE — Progress Notes (Signed)
PCP - [facility] - has seen Dr. Fletcher Anon in the past (2015) no longer seeing him.  PPM/ICD - denies  Chest x-ray - n/a EKG - 04/30/20 Stress Test - 07/19/13 ECHO - 07/29/13 Cardiac Cath - denies  Sleep Study - denies CPAP - n/a  As of today, STOP taking any Aspirin (unless otherwise instructed by your surgeon) Aleve, Naproxen, Ibuprofen, Motrin, Advil, Goody's, BC's, all herbal medications, fish oil, and all vitamins.  ERAS Protcol - yes  PRE-SURGERY Ensure or G2- pre surgery ensure given.  COVID TEST- n/a ambulatory surgery.   Anesthesia review: no  Patient denies shortness of breath, fever, cough and chest pain at PAT appointment   All instructions explained to the patient, with a verbal understanding of the material. Patient agrees to go over the instructions while at home for a better understanding. Patient also instructed to self quarantine after being tested for COVID-19. The opportunity to ask questions was provided.

## 2020-07-04 NOTE — Progress Notes (Signed)
Chief Complaint:   Kimberly Klein (MR# [id number]) is a 70 y.o. female who presents for evaluation and treatment of obesity and related comorbidities. Current BMI is Body mass index is 32.74 kg/m. Kimberly Klein has been struggling with her weight for many years and has been unsuccessful in either losing weight, maintaining weight loss, or reaching her healthy weight goal.  Kimberly Klein is currently in the action stage of change and ready to dedicate time achieving and maintaining a healthier weight. Kimberly Klein is interested in becoming our patient and working on intensive lifestyle modifications including (but not limited to) diet and exercise for weight loss.  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  She is widowed and lives alone.  She plays pickle ball in her neighborhood, [place].  She was diagnosed with breast cancer in March 2022.  Kimberly Klein eats breakfast and lunch in the cafeteria at work.  She says she is not hungry at home.  Kimberly Klein habits were reviewed today and are as follows: her desired weight loss is 50 pounds, she started gaining excessive weight 3 years ago, her heaviest weight ever was 180 pounds, she craves carbs, she skips dinner frequently, she is frequently drinking liquids with calories, she frequently makes poor food choices, she frequently eats larger portions than normal, and she struggles with emotional eating.  Depression Screen Kimberly Klein Food and Mood (modified PHQ-9) score was 14.  Depression screen PHQ 2/9 06/28/2020  Decreased Interest 3  Down, Depressed, Hopeless 3  PHQ - 2 Score 6  Altered sleeping 1  Tired, decreased energy 3  Change in appetite 2  Feeling bad or failure about yourself  1  Trouble concentrating 0  Moving slowly or fidgety/restless 1  Suicidal thoughts 0  PHQ-9 Score 14  Difficult doing work/chores Somewhat difficult   Assessment/Plan:   Orders Placed This Encounter  Procedures   Comprehensive metabolic panel    CBC with Differential/Platelet   Hemoglobin A1c   Insulin, random   Lipid panel   VITAMIN D 25 Hydroxy (Vit-D Deficiency, Fractures)   TSH   T4, free   Iron, TIBC and Ferritin Panel   1. Other fatigue Kimberly Klein denies daytime somnolence and admits to waking up still tired. Patent has a history of symptoms of morning fatigue and snoring. Kimberly Klein generally gets 5 or 6 hours of sleep per night, and states that she has poor quality sleep. Snoring is present. Apneic episodes are not present. Epworth Sleepiness Score is 2.  Kimberly Klein does feel that her weight is causing her energy to be lower than it should be. Fatigue may be related to obesity, depression or many other causes. Labs will be ordered, and in the meanwhile, Trudee will focus on self care including making healthy food choices, increasing physical activity and focusing on stress reduction.  2. SOB (shortness of breath) on exertion Kimberly Klein notes increasing shortness of breath with exercising and seems to be worsening over time with weight gain. She notes getting out of breath sooner with activity than she used to. This has gotten worse recently. Kimberly Klein denies shortness of breath at rest or orthopnea.  Kimberly Klein does feel that she gets out of breath more easily that she used to when she exercises. Kimberly Klein shortness of breath appears to be obesity related and exercise induced. She has agreed to work on weight loss and gradually increase exercise to treat her exercise induced shortness of breath. Will continue to monitor closely.    3. Essential hypertension  Not at goal. Medications: lisinopril 20 mg daily and Norvasc 5 mg daily.   Plan: Avoid buying foods that are: processed, frozen, or prepackaged to avoid excess salt.   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  Followed by Oncology. We will continue to monitor symptoms as they relate to her weight loss journey.  5. Macular degeneration, unspecified laterality Kimberly Klein uses [facility] and is followed by [facility].   6. SVT (supraventricular tachycardia) ([place]), history She has history of SVT and has had Cardiology workup in the past.  7. Chronic pain of right knee She has a torn meniscus.  Will follow because mobility and pain control are important for weight management.  8. Sciatica of right side Status post prednisone taper.  Will follow along as it relates to her weight loss journey.  9. History of tobacco abuse Course:  Kimberly Klein quit smoking 12 years ago.  She smoked for 15 years.  10. Snores Kimberly Klein endorsed snoring.  She sleeps 5-6 hours per night.  Epworth sleepiness score is 2.  11. Pure hypercholesterolemia Course: Not at goal. Lipid-lowering medications: None.   Plan: Dietary changes: Increase soluble fiber, decrease simple carbohydrates, decrease saturated fat. Exercise changes: Moderate to vigorous-intensity aerobic activity 150 minutes per week or as tolerated. We will continue to monitor along with PCP/specialists as it pertains to her weight loss journey.  Will check lipid panel today.  Lab Results  Component Value Date   CHOL 294 (H) 06/28/2020   HDL 83 06/28/2020   LDLCALC 193 (H) 06/28/2020   TRIG 105 06/28/2020   CHOLHDL 3.5 06/28/2020   Lab Results  Component Value Date   ALT 17 06/28/2020   AST 17 06/28/2020   ALKPHOS 99 06/28/2020   BILITOT 0.5 06/28/2020   The 10-year ASCVD risk score Mikey Bussing DC Jr., et al., 2013) is: 18.2%   Values used to calculate the score:     Age: 47 years     Sex: Female     Is Non-Hispanic African American: No     Diabetic: No     Tobacco smoker: No     Systolic Blood Pressure: 269 mmHg     Is BP treated: Yes     HDL Cholesterol: 83 mg/dL     Total Cholesterol: 294 mg/dL  12. Fasting  hyperglycemia Will check A1c and fasting insulin level today, as per below.  13. Other depression, with emotional eating Not at goal. Medication: None.    Plan:  Kimberly Klein eats when stressed, sad, as a reward, when bored, and when she feels guilty.  Behavior modification techniques were discussed today to help deal with emotional/non-hunger eating behaviors.  14. Class 1 obesity with serious comorbidity and body mass index (BMI) of 32.0 to 32.9 in adult, unspecified obesity type  Kimberly Klein is currently in the action stage of change and her goal is to continue with weight loss efforts. I recommend Kimberly Klein begin the structured treatment plan as follows:  She has agreed to the Category 2 Plan.  Exercise goals:  As is.    Behavioral modification strategies: increasing lean protein intake, decreasing simple carbohydrates, increasing vegetables, increasing water intake, decreasing liquid calories, decreasing alcohol intake, decreasing sodium intake, and increasing high fiber foods.  She was informed of the importance  of frequent follow-up visits to maximize her success with intensive lifestyle modifications for her multiple health conditions. She was informed we would discuss her lab results at her next visit unless there is a critical issue that needs to be addressed sooner. Kimberly Klein agreed to keep her next visit at the agreed upon time to discuss these results.  Objective:   Blood pressure (!) 145/90, pulse 79, temperature 97.9 F (36.6 C), temperature source Oral, height 5\' 2"  (1.575 m), weight 179 lb (81.2 kg), SpO2 96 %. Body mass index is 32.74 kg/m.  EKG: Normal sinus rhythm, rate 219.  Indirect Calorimeter completed today shows a VO2 of 219 and a REE of 1522.  Her calculated basal metabolic rate is 0347 thus her basal metabolic rate is better than expected.  General: Cooperative, alert, well developed, in no acute distress. HEENT: Conjunctivae and lids unremarkable. Cardiovascular:  Regular rhythm.  Lungs: Normal work of breathing. Neurologic: No focal deficits.   Lab Results  Component Value Date   CREATININE 0.92 06/28/2020   BUN 11 06/28/2020   NA 139 06/28/2020   K 4.8 06/28/2020   CL 100 06/28/2020   CO2 24 06/28/2020   Lab Results  Component Value Date   ALT 17 06/28/2020   AST 17 06/28/2020   ALKPHOS 99 06/28/2020   BILITOT 0.5 06/28/2020   Lab Results  Component Value Date   HGBA1C 5.7 (H) 06/28/2020   HGBA1C 5.3 09/27/2018   HGBA1C 4.9 05/30/2016   Lab Results  Component Value Date   INSULIN 9.1 06/28/2020   Lab Results  Component Value Date   TSH 0.998 06/28/2020   Lab Results  Component Value Date   CHOL 294 (H) 06/28/2020   HDL 83 06/28/2020   LDLCALC 193 (H) 06/28/2020   TRIG 105 06/28/2020   CHOLHDL 3.5 06/28/2020   Lab Results  Component Value Date   WBC 5.5 06/28/2020   HGB 13.9 06/28/2020   HCT 41.8 06/28/2020   MCV 95 06/28/2020   PLT 204 06/28/2020   Lab Results  Component Value Date   IRON 105 06/28/2020   TIBC 312 06/28/2020   FERRITIN 244 (H) 06/28/2020   Obesity Behavioral Intervention:   Approximately 15 minutes were spent on the discussion below.  ASK: We discussed the diagnosis of obesity with Kimberly Klein today and Kimberly Klein agreed to give [place] permission to discuss obesity behavioral modification therapy today.  ASSESS: Medrith has the diagnosis of obesity and her BMI today is 32.7. Daysie is in the action stage of change.   ADVISE: Ivonne was educated on the multiple health risks of obesity as well as the benefit of weight loss to improve her health. She was advised of the need for long term treatment and the importance of lifestyle modifications to improve her current health and to decrease her risk of future health problems.  AGREE: Multiple dietary modification options and treatment options were discussed and Kimberly Klein agreed to follow the recommendations documented in the above  note.  ARRANGE: Lougenia was educated on the importance of frequent visits to treat obesity as outlined per CMS and USPSTF guidelines and agreed to schedule her next follow up appointment today.  Attestation Statements:   This is the patient's first visit at [facility]. The patient's NEW PATIENT PACKET was reviewed at length. Included in the packet: current and past health history, medications, allergies, ROS, gynecologic history (women only), surgical history, family history, social history, weight history, weight loss surgery history (for those that  have had weight loss surgery), nutritional evaluation, mood and food questionnaire, PHQ9, Epworth questionnaire, sleep habits questionnaire, patient life and health improvement goals questionnaire. These will all be scanned into the patient's chart under media.   During the visit, I independently reviewed the patient's EKG, bioimpedance scale results, and indirect calorimeter results. I used this information to tailor a meal plan for the patient that will help her to lose weight and will improve her obesity-related conditions going forward. I performed a medically necessary appropriate examination and/or evaluation. I discussed the assessment and treatment plan with the patient. The patient was provided an opportunity to ask questions and all were answered. The patient agreed with the plan and demonstrated an understanding of the instructions. Labs were ordered at this visit and will be reviewed at the next visit unless more critical results need to be addressed immediately. Clinical information was updated and documented in the EMR.   I, Water quality scientist, CMA, am acting as transcriptionist for Briscoe Deutscher, DO  I have reviewed the above documentation for accuracy and completeness, and I agree with the above. Briscoe Deutscher, DO

## 2020-07-10 NOTE — H&P (Signed)
Kimberly Klein Location: [facility] Patient #: [id number] DOB: [birth year] Widowed / Language: [name] / Race: White Female   History of Present Illness The patient is a 70 year old female who presents for a follow-up for Breast cancer. pt is a 70 yo F who presented wtih screening detected right breast calcifications.  She also had a left breast mass on screening.  Dx imaging was performed showing 5 cm of calcs in the LOQ on the right.  The left breast mass was around 1 cm.  She had biopsies of the right breast at the anterior and posterior aspect of the calcifications.  She also had the left breast mass sampled.  The left breast mass was benign.  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    She has no prior cancer history before this.  Her brother had bladder cancer and sister with cholangiocarcinoma, brother with multiple myeloma.  She had menarche at age 61.  she is post menopausal.  She is a G3P3 wtih first child at age 71.  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    She underwent bracketed lumpectomy 05/02/2020. She had no invasive cancer seen. She also had very little pain.  Unfortunately the posterior margin was positive (implant capsule) and several other margins were <1 mm for DCIS.  I discussed multiple possible plans with her on the phone and she is here to discuss.  At this point, she would like to have reexcision and removal of implants bilaterally.  She has an appointment with Dr. Marla Roe tomorrow.    lumpectomy pathology 05/02/2020 A. BREAST, RIGHT, LUMPECTOMY: - High-grade ductal carcinoma in situ with necrosis and calcification, spanning a fibrotic area of 6.7 cm - Posterior margin is focally positive for DCIS; superior, medial, and inferior margins are focally less than 1 mm from DCIS - Background fibrocystic changes - Biopsy site changes - See oncology table  B. BREAST,  RIGHT ADDITIONAL MEDIAL MARGIN, EXCISION: - Focal high-grade ductal carcinoma in situ with necrosis - New medial resection margin is less than 1 mm from DCIS - Benign breast parenchyma with biopsy-related changes   dx mammo/us 03/20/20 [facility]  IMPRESSION: 1. Suspicious LOWER OUTER RIGHT breast calcifications spanning a distance of at least 5 cm. Tissue sampling of the anterior and posterior aspects (if technically feasible due to implant) recommended. 2. Indeterminate 1 cm UPPER LEFT breast mass. Tissue sampling is recommended given RIGHT breast findings. 3. No abnormal appearing axillary lymph nodes.  RECOMMENDATION: 1. Stereotactic guided biopsies of anterior and posterior aspects of LOWER OUTER RIGHT breast calcifications (the posterior aspect may be technically difficult to biopsy given implant). 2. Ultrasound-guided LEFT breast biopsy.  These biopsies have been scheduled.  I have discussed the findings and recommendations with the patient. If applicable, a reminder letter will be sent to the patient regarding the next appointment.  BI-RADS CATEGORY  4: Suspicious.  pathology 03/28/20 1. Breast, left, needle core biopsy, 12 o'clock - FIBROADENOMA. - NO EVIDENCE OF MALIGNANCY. 2. Breast, right, needle core biopsy, LOQ (posterior) - DUCTAL CARCINOMA IN SITU WITH NECROSIS AND CALCIFICATIONS. - SEE MICROSCOPIC DESCRIPTION. 3. Breast, right, needle core biopsy, LOQ (anterior) - DUCTAL CARCINOMA IN SITU, WITH NECROSIS AND CALCIFICATION. - COMPLEX SCLEROSING LESION WITH CALCIFICATIONS. - SEE MICROSCOPIC DESCRIPTION. Microscopic Comment 2. and 3. The DCIS has intermediate to high nuclear grade Estrogen Receptor: 20%, POSITIVE, WEAK STAINING INTENSITY Progesterone  Receptor: 0%, NEGATIVE   Allergies  No Known Allergies   [04/02/2020]: Allergies Reconciled    Medication History Xanax  (0.5MG Tablet, Oral) Active. Norvasc  (5MG Tablet, Oral) Active. Lisinopril  (20MG Tablet,  Oral) Active. Valtrex  (1GM Tablet, Oral) Active. Multiple Vitamin  (1 (one) Oral) Active. Medications Reconciled     Review of Systems  All other systems negative  Vitals Weight: 178.4 lb   Height: 62 in  Body Surface Area: 1.82 m   Body Mass Index: 32.63 kg/m   Temp.: 98.2 F    Pulse: 93 (Regular)          Physical Exam  Breast Note:  right breast without erythema or drainage from lateral incision. no appreciable seroma.     Assessment & Plan MALIGNANT NEOPLASM OF LOWER-OUTER QUADRANT OF RIGHT BREAST OF FEMALE, ESTROGEN RECEPTOR POSITIVE (C50.511) Impression: Pt has appt with [facility] tomorrow. Will set up combined surgery for reexcision and removal of bilateral implants.  Discussed risk of having positive/close margins again.  Will do this at first mutually agreeable opportunity. Current Plans You are being scheduled for surgery - Our schedulers will call you.   You should hear from our office's scheduling department within 5 working days about the location, date, and time of surgery.  We try to make accommodations for patient's preferences in scheduling surgery, but sometimes the OR schedule or the surgeon's schedule prevents [place] from making those accommodations.  If you have not heard from our office [phone]) in 5 working days, call the office and ask for your surgeon's nurse.  If you have other questions about your diagnosis, plan, or surgery, call the office and ask for your surgeon's nurse.  S/P BILATERAL BREAST IMPLANTS (Z98.82) Impression: plan removal

## 2020-07-11 ENCOUNTER — Encounter (HOSPITAL_COMMUNITY)
Admission: RE | Disposition: A | Discharge status: Home / Self Care | Payer: Self-pay | Source: Ambulatory Visit | Attending: General Surgery

## 2020-07-11 ENCOUNTER — Ambulatory Visit (HOSPITAL_COMMUNITY)
Admission: RE | Admit: 2020-07-11 | Discharge: 2020-07-11 | Disposition: A | Discharge status: Home / Self Care | Payer: PPO | Source: Ambulatory Visit | Attending: General Surgery | Admitting: General Surgery

## 2020-07-11 ENCOUNTER — Other Ambulatory Visit: Payer: Self-pay

## 2020-07-11 ENCOUNTER — Ambulatory Visit (HOSPITAL_COMMUNITY): Payer: PPO | Admitting: Anesthesiology

## 2020-07-11 ENCOUNTER — Encounter (HOSPITAL_COMMUNITY): Payer: Self-pay | Admitting: General Surgery

## 2020-07-11 DIAGNOSIS — Z79899 Other long term (current) drug therapy: Secondary | ICD-10-CM | POA: Insufficient documentation

## 2020-07-11 DIAGNOSIS — N6081 Other benign mammary dysplasias of right breast: Secondary | ICD-10-CM | POA: Diagnosis not present

## 2020-07-11 DIAGNOSIS — D0511 Intraductal carcinoma in situ of right breast: Secondary | ICD-10-CM | POA: Diagnosis not present

## 2020-07-11 DIAGNOSIS — Z8052 Family history of malignant neoplasm of bladder: Secondary | ICD-10-CM | POA: Insufficient documentation

## 2020-07-11 DIAGNOSIS — Z9882 Breast implant status: Secondary | ICD-10-CM | POA: Insufficient documentation

## 2020-07-11 DIAGNOSIS — D0501 Lobular carcinoma in situ of right breast: Secondary | ICD-10-CM | POA: Diagnosis not present

## 2020-07-11 DIAGNOSIS — Z45811 Encounter for adjustment or removal of right breast implant: Secondary | ICD-10-CM | POA: Diagnosis not present

## 2020-07-11 DIAGNOSIS — N6320 Unspecified lump in the left breast, unspecified quadrant: Secondary | ICD-10-CM | POA: Insufficient documentation

## 2020-07-11 DIAGNOSIS — K219 Gastro-esophageal reflux disease without esophagitis: Secondary | ICD-10-CM | POA: Diagnosis not present

## 2020-07-11 DIAGNOSIS — Z800 Family history of malignant neoplasm of digestive organs: Secondary | ICD-10-CM | POA: Diagnosis not present

## 2020-07-11 DIAGNOSIS — D0581 Other specified type of carcinoma in situ of right breast: Secondary | ICD-10-CM | POA: Insufficient documentation

## 2020-07-11 DIAGNOSIS — N6011 Diffuse cystic mastopathy of right breast: Secondary | ICD-10-CM | POA: Insufficient documentation

## 2020-07-11 DIAGNOSIS — T8579XA Infection and inflammatory reaction due to other internal prosthetic devices, implants and grafts, initial encounter: Secondary | ICD-10-CM | POA: Diagnosis not present

## 2020-07-11 DIAGNOSIS — C50911 Malignant neoplasm of unspecified site of right female breast: Secondary | ICD-10-CM | POA: Diagnosis not present

## 2020-07-11 DIAGNOSIS — N6021 Fibroadenosis of right breast: Secondary | ICD-10-CM | POA: Diagnosis not present

## 2020-07-11 DIAGNOSIS — Z45812 Encounter for adjustment or removal of left breast implant: Secondary | ICD-10-CM | POA: Diagnosis not present

## 2020-07-11 DIAGNOSIS — C50511 Malignant neoplasm of lower-outer quadrant of right female breast: Secondary | ICD-10-CM | POA: Diagnosis not present

## 2020-07-11 HISTORY — PX: MASTOPEXY: SHX5358

## 2020-07-11 HISTORY — PX: RE-EXCISION OF BREAST LUMPECTOMY: SHX6048

## 2020-07-11 SURGERY — EXCISION, LESION, BREAST
Anesthesia: General | Site: Breast | Laterality: Right

## 2020-07-11 MED ORDER — CHLORHEXIDINE GLUCONATE CLOTH 2 % EX PADS: 6.0000 | MEDICATED_PAD | Freq: Once | CUTANEOUS | Status: DC

## 2020-07-11 MED ORDER — MIDAZOLAM HCL 2 MG/2ML IJ SOLN
INTRAMUSCULAR | Status: AC
  Filled 2020-07-11: qty 2

## 2020-07-11 MED ORDER — ACETAMINOPHEN 500 MG PO TABS
1000.0000 mg | ORAL_TABLET | ORAL | Status: AC
  Administered 2020-07-11: 1000 mg via ORAL
  Filled 2020-07-11: qty 2

## 2020-07-11 MED ORDER — CEFAZOLIN SODIUM-DEXTROSE 2-4 GM/100ML-% IV SOLN
2.0000 g | INTRAVENOUS | Status: DC
  Filled 2020-07-11: qty 100

## 2020-07-11 MED ORDER — DEXAMETHASONE SODIUM PHOSPHATE 10 MG/ML IJ SOLN
INTRAMUSCULAR | Status: AC
  Filled 2020-07-11: qty 1

## 2020-07-11 MED ORDER — OXYCODONE HCL 5 MG/5ML PO SOLN
5.0000 mg | Freq: Once | ORAL | Status: AC | PRN
Start: 2020-07-11 — End: 2020-07-11

## 2020-07-11 MED ORDER — MIDAZOLAM HCL 5 MG/5ML IJ SOLN
INTRAMUSCULAR | Status: DC | PRN
  Administered 2020-07-11: 2 mg via INTRAVENOUS

## 2020-07-11 MED ORDER — LIDOCAINE 2% (20 MG/ML) 5 ML SYRINGE
INTRAMUSCULAR | Status: AC
  Filled 2020-07-11: qty 5

## 2020-07-11 MED ORDER — ACETAMINOPHEN 325 MG PO TABS: 650.0000 mg | ORAL_TABLET | ORAL | Status: DC | PRN

## 2020-07-11 MED ORDER — SODIUM CHLORIDE 0.9% FLUSH: 3.0000 mL | INTRAVENOUS | Status: DC | PRN

## 2020-07-11 MED ORDER — ROCURONIUM BROMIDE 10 MG/ML (PF) SYRINGE
PREFILLED_SYRINGE | INTRAVENOUS | Status: AC
  Filled 2020-07-11: qty 10

## 2020-07-11 MED ORDER — FENTANYL CITRATE (PF) 100 MCG/2ML IJ SOLN
25.0000 ug | INTRAMUSCULAR | Status: DC | PRN
Start: 2020-07-11 — End: 2020-07-11

## 2020-07-11 MED ORDER — ENSURE PRE-SURGERY PO LIQD: 296.0000 mL | Freq: Once | ORAL | Status: DC

## 2020-07-11 MED ORDER — PROPOFOL 10 MG/ML IV BOLUS
INTRAVENOUS | Status: DC | PRN
  Administered 2020-07-11: 150 mg via INTRAVENOUS
  Administered 2020-07-11: 50 mg via INTRAVENOUS

## 2020-07-11 MED ORDER — OXYCODONE HCL 5 MG PO TABS
5.0000 mg | ORAL_TABLET | Freq: Once | ORAL | Status: AC | PRN
  Administered 2020-07-11: 5 mg via ORAL

## 2020-07-11 MED ORDER — CEFAZOLIN SODIUM-DEXTROSE 2-4 GM/100ML-% IV SOLN
2.0000 g | INTRAVENOUS | Status: AC
  Administered 2020-07-11: 2 g via INTRAVENOUS

## 2020-07-11 MED ORDER — CHLORHEXIDINE GLUCONATE 0.12 % MT SOLN
15.0000 mL | Freq: Once | OROMUCOSAL | Status: AC
  Administered 2020-07-11: 15 mL via OROMUCOSAL
  Filled 2020-07-11: qty 15

## 2020-07-11 MED ORDER — PHENYLEPHRINE HCL (PRESSORS) 10 MG/ML IV SOLN
INTRAVENOUS | Status: DC | PRN
  Administered 2020-07-11: 120 ug via INTRAVENOUS
  Administered 2020-07-11: 100 ug via INTRAVENOUS
  Administered 2020-07-11: 60 ug via INTRAVENOUS
  Administered 2020-07-11: 120 ug via INTRAVENOUS

## 2020-07-11 MED ORDER — 0.9 % SODIUM CHLORIDE (POUR BTL) OPTIME
TOPICAL | Status: DC | PRN
  Administered 2020-07-11: 1000 mL

## 2020-07-11 MED ORDER — ROCURONIUM BROMIDE 100 MG/10ML IV SOLN
INTRAVENOUS | Status: DC | PRN
  Administered 2020-07-11 (×2): 20 mg via INTRAVENOUS
  Administered 2020-07-11: 50 mg via INTRAVENOUS

## 2020-07-11 MED ORDER — SODIUM CHLORIDE 0.9 % IV SOLN: 250.0000 mL | INTRAVENOUS | Status: DC | PRN

## 2020-07-11 MED ORDER — LIDOCAINE HCL 1 % IJ SOLN
INTRAMUSCULAR | Status: DC | PRN
  Administered 2020-07-11: 14 mL

## 2020-07-11 MED ORDER — SUGAMMADEX SODIUM 500 MG/5ML IV SOLN
INTRAVENOUS | Status: DC | PRN
  Administered 2020-07-11: 400 mg via INTRAVENOUS

## 2020-07-11 MED ORDER — ACETAMINOPHEN 650 MG RE SUPP: 650.0000 mg | RECTAL | Status: DC | PRN

## 2020-07-11 MED ORDER — BUPIVACAINE-EPINEPHRINE (PF) 0.25% -1:200000 IJ SOLN
INTRAMUSCULAR | Status: AC
  Filled 2020-07-11: qty 30

## 2020-07-11 MED ORDER — LACTATED RINGERS IV SOLN: INTRAVENOUS | Status: DC

## 2020-07-11 MED ORDER — LIDOCAINE HCL 1 % IJ SOLN
INTRAMUSCULAR | Status: AC
  Filled 2020-07-11: qty 20

## 2020-07-11 MED ORDER — ORAL CARE MOUTH RINSE: 15.0000 mL | Freq: Once | OROMUCOSAL | Status: AC

## 2020-07-11 MED ORDER — FENTANYL CITRATE (PF) 100 MCG/2ML IJ SOLN
INTRAMUSCULAR | Status: DC | PRN
  Administered 2020-07-11: 100 ug via INTRAVENOUS
  Administered 2020-07-11: 50 ug via INTRAVENOUS
  Administered 2020-07-11: 100 ug via INTRAVENOUS

## 2020-07-11 MED ORDER — FENTANYL CITRATE (PF) 100 MCG/2ML IJ SOLN
INTRAMUSCULAR | Status: AC
  Filled 2020-07-11: qty 2

## 2020-07-11 MED ORDER — ONDANSETRON HCL 4 MG/2ML IJ SOLN
INTRAMUSCULAR | Status: AC
  Filled 2020-07-11: qty 2

## 2020-07-11 MED ORDER — ONDANSETRON HCL 4 MG/2ML IJ SOLN
4.0000 mg | Freq: Four times a day (QID) | INTRAMUSCULAR | Status: AC | PRN
  Administered 2020-07-11: 4 mg via INTRAVENOUS

## 2020-07-11 MED ORDER — DEXAMETHASONE SODIUM PHOSPHATE 10 MG/ML IJ SOLN
INTRAMUSCULAR | Status: DC | PRN
  Administered 2020-07-11: 10 mg via INTRAVENOUS

## 2020-07-11 MED ORDER — SODIUM CHLORIDE 0.9% FLUSH: 3.0000 mL | Freq: Two times a day (BID) | INTRAVENOUS | Status: DC

## 2020-07-11 MED ORDER — OXYCODONE HCL 5 MG PO TABS
ORAL_TABLET | ORAL | Status: AC
  Filled 2020-07-11: qty 1

## 2020-07-11 MED ORDER — FENTANYL CITRATE (PF) 250 MCG/5ML IJ SOLN
INTRAMUSCULAR | Status: AC
  Filled 2020-07-11: qty 5

## 2020-07-11 MED ORDER — OXYCODONE HCL 5 MG PO TABS: 5.0000 mg | ORAL_TABLET | ORAL | Status: DC | PRN

## 2020-07-11 MED ORDER — FENTANYL CITRATE (PF) 100 MCG/2ML IJ SOLN
25.0000 ug | INTRAMUSCULAR | Status: DC | PRN
  Administered 2020-07-11: 25 ug via INTRAVENOUS

## 2020-07-11 MED ORDER — PHENYLEPHRINE 40 MCG/ML (10ML) SYRINGE FOR IV PUSH (FOR BLOOD PRESSURE SUPPORT)
PREFILLED_SYRINGE | INTRAVENOUS | Status: AC
  Filled 2020-07-11: qty 10

## 2020-07-11 MED ORDER — LIDOCAINE HCL (CARDIAC) PF 100 MG/5ML IV SOSY
PREFILLED_SYRINGE | INTRAVENOUS | Status: DC | PRN
  Administered 2020-07-11: 50 mg via INTRAVENOUS

## 2020-07-11 SURGICAL SUPPLY — 66 items
ADH SKN CLS APL DERMABOND .7 (GAUZE/BANDAGES/DRESSINGS) ×4
APL PRP STRL LF DISP 70% ISPRP (MISCELLANEOUS) ×4
BAG COUNTER SPONGE SURGICOUNT (BAG) IMPLANT
BAG DECANTER FOR FLEXI CONT (MISCELLANEOUS) ×3 IMPLANT
BAG SPNG CNTER NS LX DISP (BAG)
BINDER BREAST LRG (GAUZE/BANDAGES/DRESSINGS) IMPLANT
BINDER BREAST XLRG (GAUZE/BANDAGES/DRESSINGS) IMPLANT
BINDER BREAST XXLRG (GAUZE/BANDAGES/DRESSINGS) ×3 IMPLANT
BIOPATCH RED 1 DISK 7.0 (GAUZE/BANDAGES/DRESSINGS) ×6 IMPLANT
CANISTER SUCT 3000ML PPV (MISCELLANEOUS) ×6 IMPLANT
CHLORAPREP W/TINT 26 (MISCELLANEOUS) ×6 IMPLANT
CLIP VESOCCLUDE LG 6/CT (CLIP) IMPLANT
CNTNR URN SCR LID CUP LEK RST (MISCELLANEOUS) ×2 IMPLANT
CONT SPEC 4OZ STRL OR WHT (MISCELLANEOUS) ×3
COVER SURGICAL LIGHT HANDLE (MISCELLANEOUS) ×3 IMPLANT
DERMABOND ADVANCED (GAUZE/BANDAGES/DRESSINGS) ×2
DERMABOND ADVANCED .7 DNX12 (GAUZE/BANDAGES/DRESSINGS) ×4 IMPLANT
DRAIN CHANNEL 19F RND (DRAIN) ×6 IMPLANT
DRAPE CHEST BREAST 15X10 FENES (DRAPES) IMPLANT
DRAPE HALF SHEET 40X57 (DRAPES) IMPLANT
DRAPE ORTHO SPLIT 77X108 STRL (DRAPES) ×6
DRAPE SURG ORHT 6 SPLT 77X108 (DRAPES) ×4 IMPLANT
DRAPE WARM FLUID 44X44 (DRAPES) IMPLANT
DRSG PAD ABDOMINAL 8X10 ST (GAUZE/BANDAGES/DRESSINGS) ×3 IMPLANT
DRSG TEGADERM 4X4.75 (GAUZE/BANDAGES/DRESSINGS) ×6 IMPLANT
DRSG TELFA 3X8 NADH (GAUZE/BANDAGES/DRESSINGS) ×3 IMPLANT
ELECT REM PT RETURN 9FT ADLT (ELECTROSURGICAL) ×3
ELECTRODE REM PT RTRN 9FT ADLT (ELECTROSURGICAL) ×2 IMPLANT
EVACUATOR SILICONE 100CC (DRAIN) ×6 IMPLANT
GAUZE SPONGE 4X4 12PLY STRL (GAUZE/BANDAGES/DRESSINGS) IMPLANT
GLOVE SURG ENC MOIS LTX SZ6 (GLOVE) ×3 IMPLANT
GLOVE SURG ENC MOIS LTX SZ6.5 (GLOVE) ×3 IMPLANT
GLOVE SURG UNDER LTX SZ6.5 (GLOVE) ×3 IMPLANT
GOWN STRL REUS W/ TWL LRG LVL3 (GOWN DISPOSABLE) ×6 IMPLANT
GOWN STRL REUS W/TWL 2XL LVL3 (GOWN DISPOSABLE) ×3 IMPLANT
GOWN STRL REUS W/TWL LRG LVL3 (GOWN DISPOSABLE) ×9
ILLUMINATOR WAVEGUIDE N/F (MISCELLANEOUS) IMPLANT
KIT BASIN OR (CUSTOM PROCEDURE TRAY) ×3 IMPLANT
KIT MARKER MARGIN INK (KITS) IMPLANT
KIT TURNOVER KIT B (KITS) ×3 IMPLANT
LIGHT WAVEGUIDE WIDE FLAT (MISCELLANEOUS) IMPLANT
NEEDLE HYPO 25GX1X1/2 BEV (NEEDLE) ×3 IMPLANT
NS IRRIG 1000ML POUR BTL (IV SOLUTION) ×3 IMPLANT
PACK GENERAL/GYN (CUSTOM PROCEDURE TRAY) ×3 IMPLANT
PAD ABD 8X10 STRL (GAUZE/BANDAGES/DRESSINGS) ×3 IMPLANT
PAD ARMBOARD 7.5X6 YLW CONV (MISCELLANEOUS) ×6 IMPLANT
PENCIL SMOKE EVACUATOR (MISCELLANEOUS) ×3 IMPLANT
SPONGE T-LAP 18X18 ~~LOC~~+RFID (SPONGE) ×6 IMPLANT
STAPLER VISISTAT 35W (STAPLE) IMPLANT
STRIP CLOSURE SKIN 1/2X4 (GAUZE/BANDAGES/DRESSINGS) ×6 IMPLANT
SUT MNCRL AB 3-0 PS2 18 (SUTURE) IMPLANT
SUT MNCRL AB 4-0 PS2 18 (SUTURE) ×12 IMPLANT
SUT MON AB 3-0 SH 27 (SUTURE) ×9
SUT MON AB 3-0 SH27 (SUTURE) ×6 IMPLANT
SUT PDS AB 2-0 CT1 27 (SUTURE) IMPLANT
SUT PDS AB 3-0 SH 27 (SUTURE) ×9 IMPLANT
SUT SILK 2 0 PERMA HAND 18 BK (SUTURE) IMPLANT
SUT SILK 4 0 PS 2 (SUTURE) ×6 IMPLANT
SUT VIC AB 2-0 SH 18 (SUTURE) IMPLANT
SUT VIC AB 3-0 SH 27 (SUTURE)
SUT VIC AB 3-0 SH 27X BRD (SUTURE) IMPLANT
SYR CONTROL 10ML LL (SYRINGE) ×6 IMPLANT
TOWEL GREEN STERILE (TOWEL DISPOSABLE) ×6 IMPLANT
TOWEL GREEN STERILE FF (TOWEL DISPOSABLE) ×6 IMPLANT
TRAY FOLEY MTR SLVR 16FR STAT (SET/KITS/TRAYS/PACK) IMPLANT
WATER STERILE IRR 1000ML POUR (IV SOLUTION) IMPLANT

## 2020-07-11 NOTE — Discharge Instructions (Addendum)
INSTRUCTIONS FOR AFTER SURGERY   You will likely have some questions about what to expect following your operation.  The following information will help you and your family understand what to expect when you are discharged from the hospital.  Following these guidelines will help ensure a smooth recovery and reduce risks of complications.  Postoperative instructions include information on: diet, wound care, medications and physical activity.  AFTER SURGERY Expect to go home after the procedure.  In some cases, you may need to spend one night in the hospital for observation.  DIET This surgery does not require a specific diet.  However, I have to mention that the healthier you eat the better your body can start healing. It is important to increasing your protein intake.  This means limiting the foods with added sugar.  Focus on fruits and vegetables and some meat. It is very important to drink water after your surgery.  If your urine is bright yellow, then it is concentrated, and you need to drink more water.  As a general rule after surgery, you should have 8 ounces of water every hour while awake.  If you find you are persistently nauseated or unable to take in liquids let us know.  NO TOBACCO USE or EXPOSURE.  This will slow your healing process and increase the risk of a wound.  WOUND CARE If you have a drain: Clean with baby wipes for 3-5 days and then you can shower.  If you have a binder you may remove it to shower and then put it back on. If you have steri-strips / tape directly attached to your skin leave them in place. It is OK to get these wet.  No baths, pools or hot tubs for two weeks. We close your incision to leave the smallest and best-looking scar. No ointment or creams on your incisions until given the go ahead.  Especially not Neosporin (Too many skin reactions with this one).  A few weeks after surgery you can use Mederma and start massaging the scar. We ask you to wear your binder or  sports bra for the first 6 weeks around the clock, including while sleeping. This provides added comfort and helps reduce the fluid accumulation at the surgery site.  ACTIVITY No heavy lifting until cleared by the doctor.  It is OK to walk and climb stairs. In fact, moving your legs is very important to decrease your risk of a blood clot.  It will also help keep you from getting deconditioned.  Every 1 to 2 hours get up and walk for 5 minutes. This will help with a quicker recovery back to normal.  Let pain be your guide so you don't do too much.  NO, you cannot do the spring cleaning and don't plan on taking care of anyone else.  This is your time for TLC.   WORK Everyone returns to work at different times. As a rough guide, most people take at least 1 - 2 weeks off prior to returning to work. If you need documentation for your job, bring the forms to your postoperative follow up visit.  DRIVING Arrange for someone to bring you home from the hospital.  You may be able to drive a few days after surgery but not while taking any narcotics or valium.  BOWEL MOVEMENTS Constipation can occur after anesthesia and while taking pain medication.  It is important to stay ahead for your comfort.  We recommend taking Milk of Magnesia (2 tablespoons; twice   a day) while taking the pain pills.  SEROMA This is fluid your body tried to put in the surgical site.  This is normal but if it creates excessive pain and swelling let us know.  It usually decreases in a few weeks.  MEDICATIONS and PAIN CONTROL At your preoperative visit for you history and physical you were given the following medications: An antibiotic: Start this medication when you get home and take according to the instructions on the bottle. Zofran 4 mg:  This is to treat nausea and vomiting.  You can take this every 6 hours as needed and only if needed. Norco (hydrocodone/acetaminophen) 5/325 mg:  This is only to be used after you have taken the  motrin or the tylenol. Every 8 hours as needed. Over the counter Medication to take: Ibuprofen (Motrin) 600 mg:  Take this every 6 hours.  If you have additional pain then take 500 mg of the tylenol.  Only take the Norco after you have tried these two. Miralax or stool softener of choice: Take this according to the bottle if you take the Norco.  WHEN TO CALL Call your surgeon's office if any of the following occur:  Fever 101 degrees F or greater  Excessive bleeding or fluid from the incision site.  Pain that increases over time without aid from the medications  Redness, warmth, or pus draining from incision sites  Persistent nausea or inability to take in liquids  Severe misshapen area that underwent the operation.  [facility]  What is the benefit of having a drain?  During surgery your tissue layers are separated.  This raw surface stimulates your body to fill the space with serous fluid.  This is normal but you don't want that fluid to collect and prevent healing.  A fluid collection can also become infected.  The Jackson-Pratt (JP) drain is used to eliminate this collection of fluid and allow the tissue to heal together.    Jackson-Pratt (JP) bulb    How to care for your drainage and suction unit at home Your drainage catheter will be connected to a collection device. The vacuum caused when the device is compressed allows drainage to collect in the device.    Wash your hands with soap and water before and after touching the system. Empty the JP drain every 12 hours once you get home from your procedure. Record the fluid amount on the record sheet included. Start with stripping the drain tube to push the clots or excess fluid to the bulb.  Do this by pinching the tube with one hand near your skin.  Then with the other hand squeeze the tubing and work it toward the bulb.  This should be done several times a day.  This may collapse the tube which will correct on its  own.   Use a safety pin to attach your collection device to your clothing so there is no tension on the insertion site.   If you have drainage at the skin insertion site, you can apply a gauze dressing and secure it with tape. If the drain falls out, apply a gauze dressing over the drain insertion site and secure with tape.   To empty the collection device:   Release the stopper on the top of the collection unit (bulb).  Pour contents into a measuring container such as a plastic medicine cup.  Record the day and amount of drainage on the attached sheet. This should be done at least   twice a day.    To compress the Jackson-Pratt Bulb:  Release the stopper at the top of the bulb. Squeeze the bulb tightly in your fist, squeezing air out of the bulb.  Replace the stopper while the bulb is compressed.  Be careful not to spill the contents when squeezing the bulb. The drainage will start bright red and turn to pink and then yellow with time. IMPORTANT: If the bulb is not squeezed before adding the stopper it will not draw out the fluid.  Care for the JP drain site and your skin daily:  You may shower three days after surgery. Secure the drain to a ribbon or cloth around your waist while showering so it does not pull out while showering. Be sure your hands are cleaned with soap and water. Use a clean wet cotton swab to clean the skin around the drain site.  Use another cotton swab to place Vaseline or antibiotic ointment on the skin around the drain.     Contact your physician if any of the following occur:  The fluid in the bulb becomes cloudy. Your temperature is greater than 101.4.  The incision opens. If you have drainage at the skin insertion site, you can apply a gauze dressing and secure it with tape. If the drain falls out, apply a gauze dressing over the drain insertion site and secure with tape.  You will usually have more drainage when you are active than while you rest or are  asleep. If the drainage increases significantly or is bloody call the physician                             Bring this record with you to each office visit Date  Drainage Volume  Date   Drainage volume                                                                                                                                                                                          

## 2020-07-11 NOTE — Transfer of Care (Signed)
Immediate Anesthesia Transfer of Care Note  Patient: Kimberly Klein  Procedure(s) Performed: RE-EXCISION OF RIGHT BREAST LUMPECTOMY (Right: Breast) BILATERAL MASTOPEXY AND REMOVAL OF IMPLANTS WITH COMPLETE CAPSULECTOMY (Bilateral: Breast)  Patient Location: PACU  Anesthesia Type:General  Level of Consciousness: awake, alert , oriented and patient cooperative  Airway & Oxygen Therapy: Patient Spontanous Breathing  Post-op Assessment: Report given to RN, Post -op Vital signs reviewed and stable and Patient moving all extremities X 4  Post vital signs: Reviewed and stable  Last Vitals:  Vitals Value Taken Time  BP 149/91 07/11/20 1526  Temp    Pulse 97 07/11/20 1527  Resp 21 07/11/20 1527  SpO2 97 % 07/11/20 1527  Vitals shown include unvalidated device data.  Last Pain:  Vitals:   07/11/20 1058  TempSrc:   PainSc: 0-No pain         Complications: No notable events documented.

## 2020-07-11 NOTE — Interval H&P Note (Signed)
History and Physical Interval Note:  07/11/2020 12:47 PM  Kimberly Klein  has presented today for surgery, with the diagnosis of RIGHT BREAST CANCER.  The various methods of treatment have been discussed with the patient and family. After consideration of risks, benefits and other options for treatment, the patient has consented to  Procedure(s): RE-EXCISION OF RIGHT BREAST LUMPECTOMY (Right) BILATERAL MASTOPEXY AND REMOVAL OF IMPLANTS WITH COMPLETE CAPSULECTOMY (Bilateral) as a surgical intervention.  The patient's history has been reviewed, patient examined, no change in status, stable for surgery.  I have reviewed the patient's chart and labs.  Questions were answered to the patient's satisfaction.     Loel Lofty Ray Gervasi

## 2020-07-11 NOTE — Op Note (Signed)
DATE OF OPERATION: 07/11/2020  LOCATION: [facility]  PREOPERATIVE DIAGNOSIS: Right breast cancer  POSTOPERATIVE DIAGNOSIS: Same  PROCEDURE: Bilateral removal of breast implants, capsulectomy and mastopexy  SURGEON: Lyndee Leo Sanger Addysin Porco, DO  ASSISTANT: Roetta Sessions, PA  EBL: 5 cc  CONDITION: Stable  COMPLICATIONS: None  INDICATION: The patient, Kimberly Klein, is a 70 y.o. female born on 1950-05-20, is here for treatment of right breast cancer.  She had implants in place that needed to be removed due to the proximity to the positive margin   PROCEDURE DETAILS:  The patient was seen prior to surgery and marked.  The IV antibiotics were given. The patient was taken to the operating room by general surgery.  A general anesthetic was administered. A standard time out was performed and all information was confirmed by those in the room. SCDs were placed.  General surgery performed their portion of the case and is dictated separately.   Another time out was performed.  Right:  Using the same incision I excised the pectoralis fascia that was directly at the removed specimen site.  Two staples were at 12 o'clock, one at three o'clock and one at the 9 o'clock position.  This was sent to path.  I then inked the remaining deep margin that was capsule and removed it as well.  The implant was removed which was a 300 cc saline implant without any other markings.  The bovie was used to excise the capsule for all areas that were able.  The capsule was very thin.  Hemostasis was achieved with electrocautery.  A drain was placed and secured to the skin with the 4-0 Silk.  The deep layer was closed with the 3-0 PDS followed by the 3-0 Monocryl.  The skin was closed with the 4-0 Monocryl.  Using a #10 blade a cirumareolar incision was made at the superior aspect of the areola from the 9 to the 3 o'clock position.  This was de-epithelialized.  The mastopexy was performed and with elevation of  the areola.  This was done with extreme caution to not violate the blood supply due to the previous surgeries.  Left: Local with epinephrine was injected around the superior aspect of the areola the areola was marked and a crescent shaped mark at the superior aspect of the areola was made.  This was from the 9:00 to 3 o'clock position.  A #10 blade was used to make the incision.  The Bovie was used to dissected at the lateral aspect of the areola down to the capsule.  The capsule was incised and the implant was removed.  It was a Mentor moderate profile 300 cc saline implant.  The Bovie was used to dissect the capsule free from the surrounding tissue as much as possible.  It was very thin.  Hemostasis was achieved with electrocautery.  A drain was placed and secured to the skin with a 4-0 silk.  Each areola was elevated approximately 1 cm.  The deep layer was closed with 3-0 PDS followed by 3-0 Monocryl.  The skin was closed with 4-0 Monocryl.  Dermabond and Steri-Strips were applied to both incision sites.  The patient was allowed to wake up and taken to recovery room in stable condition at the end of the case. The family was notified at the end of the case.   The advanced practice practitioner (APP) assisted throughout the case.  The APP was essential in retraction and counter traction when needed to make the case  progress smoothly.  This retraction and assistance made it possible to see the tissue plans for the procedure.  The assistance was needed for blood control, tissue re-approximation and assisted with closure of the incision site.

## 2020-07-11 NOTE — Op Note (Addendum)
Re-excisional Right Breast Lumpectomy   Indications: This patient presents with history of positive and close margins after partial mastectomy for right breast cancer, LOQ, cTis, with implants in place (years prior to breast cancer diagnosis)  Pre-operative Diagnosis: right breast cancer   Post-operative Diagnosis: right breast cancer   Surgeon: [name]   Assist: Carlena Hurl, PA  Anesthesia: General anesthesia   ASA Class: 2   Procedure Details  The patient was seen in the Holding Room. The risks, benefits, complications, treatment options, and expected outcomes were discussed with the patient. The possibilities of reaction to medication, pulmonary aspiration, bleeding, infection, the need for additional procedures, failure to diagnose a condition, and creating a complication requiring transfusion or operation were discussed with the patient. The patient concurred with the proposed plan, giving informed consent. The site of surgery properly noted/marked. The patient was taken to Operating Room # 9, identified, and the procedure verified as re-excision of right breast cancer.  After induction of anesthesia, the bilateral breast and chest were prepped and draped in standard fashion.  The lumpectomy was performed by reopening the prior incision. Seroma was aspirated. The mastopexy sutures were removed. Additional margins were taken at the superior, medial, and inferior borders of the partial mastectomy cavity. The anterior capsule of the implant was the posterior margin.  I marked this with clips in the cardinal directions.  This margin will be taken intact with the implant capsule and will make up the posterior margin. Orientation sutures were placed in the specimens. Hemostasis was achieved with cautery. The wound was irrigated and closed with a 3-0 Vicryl deep dermal interrupted and a 4-0 Monocryl subcuticular closure in layers.  Sterile dressings were applied. At the end of the operation,  all sponge, instrument, and needle counts were correct.   Findings:  grossly clear surgical margins, posterior margin is gone, it was implant capsule and small layer of muscle.  Anterior margin is skin.    Estimated Blood Loss: Minimal   Drains: none   Specimens: additional superior, medial, and inferior margins.   Complications: None; patient tolerated the procedure well.   Disposition: PACU - hemodynamically stable.   Condition: stable

## 2020-07-11 NOTE — Anesthesia Preprocedure Evaluation (Signed)
Anesthesia Evaluation  Patient identified by MRN, date of birth, ID band Patient awake    Reviewed: Allergy & Precautions, H&P , NPO status , Patient's Chart, lab work & pertinent test results  Airway Mallampati: II   Neck ROM: full    Dental   Pulmonary former smoker,    breath sounds clear to auscultation       Cardiovascular hypertension, + dysrhythmias Supra Ventricular Tachycardia  Rhythm:regular Rate:Normal     Neuro/Psych  Neuromuscular disease    GI/Hepatic GERD  ,  Endo/Other    Renal/GU      Musculoskeletal   Abdominal   Peds  Hematology   Anesthesia Other Findings   Reproductive/Obstetrics Breast CA                             Anesthesia Physical Anesthesia Plan  ASA: 2  Anesthesia Plan: General   Post-op Pain Management:    Induction: Intravenous  PONV Risk Score and Plan: 3 and Ondansetron, Dexamethasone and Treatment may vary due to age or medical condition  Airway Management Planned: Oral ETT  Additional Equipment:   Intra-op Plan:   Post-operative Plan: Extubation in OR  Informed Consent: I have reviewed the patients History and Physical, chart, labs and discussed the procedure including the risks, benefits and alternatives for the proposed anesthesia with the patient or authorized representative who has indicated his/her understanding and acceptance.     Dental advisory given  Plan Discussed with: CRNA, Anesthesiologist and Surgeon  Anesthesia Plan Comments:         Anesthesia Quick Evaluation

## 2020-07-11 NOTE — Anesthesia Procedure Notes (Signed)
Procedure Name: Intubation Date/Time: 07/11/2020 12:55 PM Performed by: Jonna Munro, CRNA Pre-anesthesia Checklist: Patient identified, Emergency Drugs available, Suction available, Patient being monitored and Timeout performed Patient Re-evaluated:Patient Re-evaluated prior to induction Oxygen Delivery Method: Circle system utilized Preoxygenation: Pre-oxygenation with 100% oxygen Induction Type: IV induction Ventilation: Mask ventilation without difficulty Laryngoscope Size: Mac and 3 Grade View: Grade I Tube type: Oral Tube size: 7.0 mm Number of attempts: 1 Airway Equipment and Method: Stylet Placement Confirmation: ETT inserted through vocal cords under direct vision, positive ETCO2 and breath sounds checked- equal and bilateral Secured at: 21 cm Tube secured with: Tape

## 2020-07-11 NOTE — Anesthesia Postprocedure Evaluation (Signed)
Anesthesia Post Note  Patient: Kimberly Klein  Procedure(s) Performed: RE-EXCISION OF RIGHT BREAST LUMPECTOMY (Right: Breast) BILATERAL MASTOPEXY AND REMOVAL OF IMPLANTS WITH COMPLETE CAPSULECTOMY (Bilateral: Breast)     Patient location during evaluation: PACU Anesthesia Type: General Level of consciousness: awake and alert Pain management: pain level controlled Vital Signs Assessment: post-procedure vital signs reviewed and stable Respiratory status: spontaneous breathing, nonlabored ventilation, respiratory function stable and patient connected to nasal cannula oxygen Cardiovascular status: blood pressure returned to baseline and stable Postop Assessment: no apparent nausea or vomiting Anesthetic complications: no   No notable events documented.  Last Vitals:  Vitals:   07/11/20 1541 07/11/20 1556  BP: (!) 150/75 (!) 149/74  Pulse: 83 76  Resp: 14 13  Temp:  36.8 C  SpO2: 98% 98%    Last Pain:  Vitals:   07/11/20 1526  TempSrc:   PainSc: 4                  Odes Lolli COKER

## 2020-07-12 ENCOUNTER — Encounter (HOSPITAL_COMMUNITY): Payer: Self-pay | Admitting: General Surgery

## 2020-07-17 ENCOUNTER — Encounter: Payer: Self-pay | Admitting: *Deleted

## 2020-07-17 ENCOUNTER — Encounter (INDEPENDENT_AMBULATORY_CARE_PROVIDER_SITE_OTHER): Payer: Self-pay | Admitting: Family Medicine

## 2020-07-17 ENCOUNTER — Other Ambulatory Visit: Payer: Self-pay

## 2020-07-17 ENCOUNTER — Ambulatory Visit (INDEPENDENT_AMBULATORY_CARE_PROVIDER_SITE_OTHER): Payer: PPO | Admitting: Family Medicine

## 2020-07-17 VITALS — BP 148/79 | HR 75 | Temp 98.0°F | Ht 62.0 in | Wt 177.0 lb

## 2020-07-17 DIAGNOSIS — Z6832 Body mass index (BMI) 32.0-32.9, adult: Secondary | ICD-10-CM | POA: Diagnosis not present

## 2020-07-17 DIAGNOSIS — E7800 Pure hypercholesterolemia, unspecified: Secondary | ICD-10-CM

## 2020-07-17 DIAGNOSIS — E669 Obesity, unspecified: Secondary | ICD-10-CM

## 2020-07-17 DIAGNOSIS — R7301 Impaired fasting glucose: Secondary | ICD-10-CM | POA: Diagnosis not present

## 2020-07-17 DIAGNOSIS — D0511 Intraductal carcinoma in situ of right breast: Secondary | ICD-10-CM

## 2020-07-17 DIAGNOSIS — I10 Essential (primary) hypertension: Secondary | ICD-10-CM | POA: Diagnosis not present

## 2020-07-17 DIAGNOSIS — E559 Vitamin D deficiency, unspecified: Secondary | ICD-10-CM | POA: Diagnosis not present

## 2020-07-17 LAB — SURGICAL PATHOLOGY

## 2020-07-17 MED ORDER — VITAMIN D (ERGOCALCIFEROL) 1.25 MG (50000 UNIT) PO CAPS: 50000.0000 [IU] | ORAL_CAPSULE | ORAL | 0 refills | Status: DC

## 2020-07-17 MED ORDER — OZEMPIC (0.25 OR 0.5 MG/DOSE) 2 MG/1.5ML ~~LOC~~ SOPN: 0.2500 mg | PEN_INJECTOR | SUBCUTANEOUS | 0 refills | Status: DC

## 2020-07-19 ENCOUNTER — Telehealth: Payer: Self-pay | Admitting: Radiation Oncology

## 2020-07-19 ENCOUNTER — Encounter: Payer: Self-pay | Admitting: *Deleted

## 2020-07-19 DIAGNOSIS — D0511 Intraductal carcinoma in situ of right breast: Secondary | ICD-10-CM

## 2020-07-20 ENCOUNTER — Other Ambulatory Visit: Payer: Self-pay

## 2020-07-20 ENCOUNTER — Ambulatory Visit (INDEPENDENT_AMBULATORY_CARE_PROVIDER_SITE_OTHER): Payer: PPO | Admitting: Plastic Surgery

## 2020-07-20 ENCOUNTER — Encounter: Payer: Self-pay | Admitting: Plastic Surgery

## 2020-07-20 DIAGNOSIS — D0511 Intraductal carcinoma in situ of right breast: Secondary | ICD-10-CM

## 2020-07-20 NOTE — Progress Notes (Signed)
The patient is a 70 year old female here for postop follow-up.  She has had minimal drainage from her breast drains.  She was diagnosed with right breast ductal carcinoma.  She had a partial mastectomy and required additional margins.  In coordination with Dr. Barry Dienes she had a repeat partial mastectomy and we did removal of the capsule of the implant and the implants.  She is doing extremely well no sign of infection.  No sign of a hematoma or seroma.  The drains were removed and we will see her back in a few weeks.

## 2020-07-25 ENCOUNTER — Encounter: Payer: Self-pay | Admitting: *Deleted

## 2020-07-25 ENCOUNTER — Encounter: Payer: Self-pay | Admitting: Family Medicine

## 2020-07-25 NOTE — Progress Notes (Signed)
Chief Complaint:   OBESITY Kimberly Klein is here to discuss her progress with her obesity treatment plan along with follow-up of her obesity related diagnoses.   Today's visit was #: 2 Starting weight: 179 lbs Starting date: 06/28/2020 Today's weight: 177 lbs Today's date: 07/17/2020 Weight change since last visit: 2 lbs Total lbs lost to date: 2 lbs Body mass index is 32.37 kg/m.  Total weight loss percentage to date: -1.12%  Interim History:  Kimberly Klein says she has had a rough couple of weeks.  She had surgery on 07/11/2020.  She saw her labs and is unhappy that they are worse than ever before.  She endorses some depressive symptoms/decreased motivation.  Current Meal Plan: the Category 2 Plan for 75% of the time.  Current Exercise Plan: None.  Assessment/Plan:   Meds ordered this encounter  Medications   Semaglutide,0.25 or 0.5MG /DOS, (OZEMPIC, 0.25 OR 0.5 MG/DOSE,) 2 MG/1.5ML SOPN    Sig: Inject 0.25 mg into the skin once a week.    Dispense:  1.5 mL    Refill:  0   Vitamin D, Ergocalciferol, (DRISDOL) 1.25 MG (50000 UNIT) CAPS capsule    Sig: Take 1 capsule (50,000 Units total) by mouth every 7 (seven) days.    Dispense:  12 capsule    Refill:  0    1. Essential hypertension Elevated today.  Medications: Norvasc 5 mg daily.   Plan:  Worsening.  Discussed labs with patient today.  Avoid buying foods that are: processed, frozen, or prepackaged to avoid excess salt. We will watch for signs of hypotension as she continues lifestyle modifications.  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  3. Pure hypercholesterolemia Course: Not at goal. Lipid-lowering medications: None.   Plan:  Worsening.  Discussed labs with patient today.  Dietary changes: Increase soluble fiber,  decrease simple carbohydrates, decrease saturated fat. Exercise changes: Moderate to vigorous-intensity aerobic activity 150 minutes per week or as tolerated. We will continue to monitor along with PCP/specialists as it pertains to her weight loss journey.  Lab Results  Component Value Date   CHOL 294 (H) 06/28/2020   HDL 83 06/28/2020   LDLCALC 193 (H) 06/28/2020   TRIG 105 06/28/2020   CHOLHDL 3.5 06/28/2020   Lab Results  Component Value Date   ALT 17 06/28/2020   AST 17 06/28/2020   ALKPHOS 99 06/28/2020   BILITOT 0.5 06/28/2020   The 10-year ASCVD risk score Kimberly Bussing DC Jr., Kimberly al., Kimberly Klein) is: 21%   Values used to calculate the score:     Age: 70 years     Sex: Female     Is Non-Hispanic African American: No     Diabetic: No     Tobacco smoker: No     Systolic Blood Pressure: 850 mmHg     Is BP treated: Yes     HDL Cholesterol: 83 mg/dL     Total Cholesterol: 294 mg/dL  4. Impaired fasting glucose/Prediabetes Not optimized. Goal is HgbA1c < 5.7.  Medication: None.    Plan:  New.  Discussed labs with patient today.  Start Ozempic 0.25 mg subcutaneously weekly.  She will continue to focus on protein-rich, low simple carbohydrate foods. We reviewed the importance of hydration, regular exercise for stress reduction, and restorative  sleep.   Lab Results  Component Value Date   HGBA1C 5.7 (H) 06/28/2020   Lab Results  Component Value Date   INSULIN 9.1 06/28/2020   - Start Semaglutide,0.25 or 0.5MG /DOS, (OZEMPIC, 0.25 OR 0.5 MG/DOSE,) 2 MG/1.5ML SOPN; Inject 0.25 mg into the skin once a week.  Dispense: 1.5 mL; Refill: 0  5. Vitamin D deficiency Not at goal.  She is not currently taking a vitamin D supplement.  Plan:  New.  Discussed labs with patient today.  Start prescription Vitamin D @50 ,000 IU every week as prescribed.  Follow-up for routine testing of Vitamin D, at least 2-3 times per year to avoid over-replacement.  Lab Results  Component Value Date   VD25OH 17.5  (L) 06/28/2020   - Start Vitamin D, Ergocalciferol, (DRISDOL) 1.25 MG (50000 UNIT) CAPS capsule; Take 1 capsule (50,000 Units total) by mouth every 7 (seven) days.  Dispense: 12 capsule; Refill: 0  6. Obesity, current BMI 32.5  Course: Kimberly Klein is currently in the action stage of change. As such, her goal is to continue with weight loss efforts.   Nutrition goals: She has agreed to the Category 2 Plan.   Exercise goals: No exercise has been prescribed at this time.  Behavioral modification strategies: increasing lean protein intake, decreasing simple carbohydrates, increasing vegetables, and increasing water intake.  Kimberly Klein has agreed to follow-up with our clinic in 3 weeks. She was informed of the importance of frequent follow-up visits to maximize her success with intensive lifestyle modifications for her multiple health conditions.   Objective:   Blood pressure (!) 148/79, pulse 75, temperature 98 F (36.7 C), temperature source Oral, height 5\' 2"  (1.575 m), weight 177 lb (80.3 kg), SpO2 95 %. Body mass index is 32.37 kg/m.  General: Cooperative, alert, well developed, in no acute distress. HEENT: Conjunctivae and lids unremarkable. Cardiovascular: Regular rhythm.  Lungs: Normal work of breathing. Neurologic: No focal deficits.   Lab Results  Component Value Date   CREATININE 0.92 06/28/2020   BUN 11 06/28/2020   NA 139 06/28/2020   K 4.8 06/28/2020   CL 100 06/28/2020   CO2 24 06/28/2020   Lab Results  Component Value Date   ALT 17 06/28/2020   AST 17 06/28/2020   ALKPHOS 99 06/28/2020   BILITOT 0.5 06/28/2020   Lab Results  Component Value Date   HGBA1C 5.7 (H) 06/28/2020   HGBA1C 5.3 09/27/2018   HGBA1C 4.9 05/30/2016   Lab Results  Component Value Date   INSULIN 9.1 06/28/2020   Lab Results  Component Value Date   TSH 0.998 06/28/2020   Lab Results  Component Value Date   CHOL 294 (H) 06/28/2020   HDL 83 06/28/2020   LDLCALC 193 (H) 06/28/2020    TRIG 105 06/28/2020   CHOLHDL 3.5 06/28/2020   Lab Results  Component Value Date   VD25OH 17.5 (L) 06/28/2020   Lab Results  Component Value Date   WBC 5.5 06/28/2020   HGB 13.9 06/28/2020   HCT 41.8 06/28/2020   MCV 95 06/28/2020   PLT 204 06/28/2020   Lab Results  Component Value Date   IRON 105 06/28/2020   TIBC 312 06/28/2020   FERRITIN 244 (H) 06/28/2020   Obesity Behavioral Intervention:   Approximately 15 minutes were spent on the discussion below.  ASK: We discussed the diagnosis of obesity with Kimberly Klein today and Kimberly Klein agreed to give [place] permission to discuss obesity behavioral modification therapy today.  ASSESS: Kimberly Klein has the diagnosis  of obesity and her BMI today is 32.5. Kimberly Klein is in the action stage of change.   ADVISE: Kimberly Klein was educated on the multiple health risks of obesity as well as the benefit of weight loss to improve her health. She was advised of the need for long term treatment and the importance of lifestyle modifications to improve her current health and to decrease her risk of future health problems.  AGREE: Multiple dietary modification options and treatment options were discussed and Kimberly Klein agreed to follow the recommendations documented in the above note.  ARRANGE: Kimberly Klein was educated on the importance of frequent visits to treat obesity as outlined per CMS and USPSTF guidelines and agreed to schedule her next follow up appointment today.  Attestation Statements:   Reviewed by clinician on day of visit: allergies, medications, problem list, medical history, surgical history, family history, social history, and previous encounter notes.  I, Water quality scientist, CMA, am acting as transcriptionist for Briscoe Deutscher, DO  I have reviewed the above documentation for accuracy and completeness, and I agree with the above. Briscoe Deutscher, DO

## 2020-07-30 DIAGNOSIS — Z170 Estrogen receptor positive status [ER+]: Secondary | ICD-10-CM | POA: Insufficient documentation

## 2020-07-30 DIAGNOSIS — C50511 Malignant neoplasm of lower-outer quadrant of right female breast: Secondary | ICD-10-CM | POA: Insufficient documentation

## 2020-08-03 NOTE — Progress Notes (Signed)
Location of Breast Cancer:right breast  Histology per Pathology Report: right DCIS Biopsy on 03/28/2020 showed: high-grade DCIS of the LOQ (posterior and anterior) right breast with necrosis and calcifications. Left breast was negative for malignancy.  Receptor Status: Estrogen Receptor: 20%, POSITIVE, WEAK STAINING INTENSITY Progesterone Receptor: 0%, NEGATIVE  Did patient present with symptoms (if so, please note symptoms) or was this found on screening mammography?: routine screening mammography on 02/24/2020 that showed possible microcalcifications in the right breast and a possible mass in the left breast.   Past/Anticipated interventions by surgeon, if any:  05/02/2020 Right Breast Radioactive seed bracketed lumpectomy Surgeon: [facility]  07/11/2020 Re-excisional Right Breast Lumpectomy  Indications: This patient presents with history of positive and close margins after partial mastectomy for right breast cancer, LOQ, cTis, with implants in place (years prior to breast cancer diagnosis) Surgeon: [name]   Past/Anticipated interventions by medical oncology, if any: Upcoming appointment with Dr Jana Hakim on 10/01/2020 the overall plan is for surgery, followed by radiation, then a discussion of anti-estrogens.  Lymphedema issues, if any:  no    Pain issues, if any:  no   SAFETY ISSUES: Prior radiation? no Pacemaker/ICD? no Possible current pregnancy?no, postmenopausal Is the patient on methotrexate? no  Current Complaints / other details:  none     Vitals:   08/08/20 0946  BP: 108/68  Pulse: 85  Resp: 20  Temp: 98.1 F (36.7 C)  SpO2: 100%  Weight: 179 lb 3.2 oz (81.3 kg)  Height: '5\' 2"'$  (1.575 m)

## 2020-08-06 ENCOUNTER — Ambulatory Visit: Payer: PPO | Admitting: Radiation Oncology

## 2020-08-06 ENCOUNTER — Institutional Professional Consult (permissible substitution): Payer: PPO | Admitting: Radiation Oncology

## 2020-08-06 ENCOUNTER — Ambulatory Visit: Payer: PPO

## 2020-08-07 ENCOUNTER — Other Ambulatory Visit: Payer: Self-pay

## 2020-08-07 ENCOUNTER — Encounter (INDEPENDENT_AMBULATORY_CARE_PROVIDER_SITE_OTHER): Payer: Self-pay | Admitting: Family Medicine

## 2020-08-07 ENCOUNTER — Ambulatory Visit (INDEPENDENT_AMBULATORY_CARE_PROVIDER_SITE_OTHER): Payer: PPO | Admitting: Family Medicine

## 2020-08-07 VITALS — BP 116/73 | HR 78 | Temp 98.0°F | Ht 62.0 in | Wt 173.0 lb

## 2020-08-07 DIAGNOSIS — E669 Obesity, unspecified: Secondary | ICD-10-CM | POA: Diagnosis not present

## 2020-08-07 DIAGNOSIS — Z6832 Body mass index (BMI) 32.0-32.9, adult: Secondary | ICD-10-CM | POA: Diagnosis not present

## 2020-08-07 DIAGNOSIS — R7301 Impaired fasting glucose: Secondary | ICD-10-CM | POA: Diagnosis not present

## 2020-08-07 DIAGNOSIS — I10 Essential (primary) hypertension: Secondary | ICD-10-CM

## 2020-08-07 DIAGNOSIS — E559 Vitamin D deficiency, unspecified: Secondary | ICD-10-CM

## 2020-08-07 MED ORDER — OZEMPIC (0.25 OR 0.5 MG/DOSE) 2 MG/1.5ML ~~LOC~~ SOPN
0.5000 mg | PEN_INJECTOR | SUBCUTANEOUS | 0 refills | Status: DC
Start: 2020-08-07 — End: 2020-09-19

## 2020-08-07 NOTE — Progress Notes (Signed)
Radiation Oncology         [phone]) [phone] ________________________________  Name: Kimberly Klein MRN: [id number]  Date: 08/08/2020  DOB: 1950-01-25  Re-Evaluation Note  CC: Valerie Roys, DO  Magrinat, Virgie Dad, MD  Diagnosis: The encounter diagnosis was Ductal carcinoma in situ (DCIS) of right breast.    Stage 0 Right Breast LOQ, DCIS, ER+ / PR- / Grade 2 or 3     ICD-10-CM    1. Ductal carcinoma in situ (DCIS) of right breast D05.11     Narrative:  The patient returns today to discuss radiation treatment options. She was seen in the multidisciplinary breast clinic on 04/04/20.   She opted to proceed with right breast lumpectomy, w/ medial margin excision on 05/02/20. Pathology from the procedure revealed: high-grade ductal carcinoma in situ with necrosis and calcification spanning 6.7 cm; with the posterior margin focally positive for DCIS; the superior, medial, and inferior margins were focally less than 1 mm from DCIS. ER: 20%, positive with weak staining intensity, and PR: 0%, negative. Excision of the additional right medial margin revealed: focal high-grade ductal carcinoma in situ with necrosis, with the new medial resection margin less than 1  mm from DCIS.  On 07/11/20, the patient underwent re-excision of right breast lumpectomy under Dr. Barry Dienes, and bilateral mastopexy and removal of bilateral breast implants and complete capsulectomy under Dr. Marla Roe . Pathology from the procedure revealed the following:  --Excision of right additional medial margin: intermediate to high grade ductal carcinoma in situ and lobular carcinoma in situ, with resection margins negative for DCIS. --Excision of right additional superior margin: lobular carcinoma in situ and fibrocystic change with adenosis. Negative for carcinoma.  --Excision of right additional inferior margin: foci of high grade ductal carcinoma in situ with necrosis and calcifications, with resection margins negative for  DCIS --Excision of right additional deep margin: intermediate to high grade ductal carcinoma in situ with resection margins negative for carcinoma; new deep margin is focally less than 0.1 cm from DCIS --Right and left breast capsule negative for carcinoma  --Right and left breast skin excisions negative for carcinoma  The patient followed up with Dr. Marla Roe on 07/20/20 in which the patient is reported to be doing extremely well post-op with no signs of infection, hematoma, or seroma. At this time she had her breast drains removed.   On review of systems, the patient reports discomfort in the breast but no pain. She denies significant swelling in the breast or nipple discharge or bleeding.   Allergies:  has No Known Allergies.  Meds: Current Outpatient Medications  Medication Sig Dispense Refill   amLODipine (NORVASC) 5 MG tablet TAKE 1 TABLET(5 MG) BY MOUTH DAILY (Patient taking differently: Take 5 mg by mouth daily. TAKE 1 TABLET(5 MG) BY MOUTH DAILY) 90 tablet 1   lisinopril (ZESTRIL) 20 MG tablet Take 20 mg by mouth daily.     Multiple Vitamins-Minerals (PRESERVISION AREDS 2+MULTI VIT PO) Take 1 capsule by mouth in the morning and at bedtime.     ondansetron (ZOFRAN) 4 MG tablet Take 1 tablet (4 mg total) by mouth every 8 (eight) hours as needed for nausea or vomiting. 20 tablet 0   Semaglutide,0.25 or 0.'5MG'$ /DOS, (OZEMPIC, 0.25 OR 0.5 MG/DOSE,) 2 MG/1.5ML SOPN Inject 0.5 mg into the skin once a week. 1.5 mL 0   valACYclovir (VALTREX) 1000 MG tablet Take 1 tablet (1,000 mg total) by mouth 2 (two) times daily. (Patient taking differently: Take 1,000 mg by mouth  2 (two) times daily as needed (outbreaks).) 20 tablet 6   Vitamin D, Ergocalciferol, (DRISDOL) 1.25 MG (50000 UNIT) CAPS capsule Take 1 capsule (50,000 Units total) by mouth every 7 (seven) days. 12 capsule 0   No current facility-administered medications for this encounter.    Physical Findings: The patient is in no acute  distress. Patient is alert and oriented.  height is '5\' 2"'$  (1.575 m) and weight is 179 lb 3.2 oz (81.3 kg). Her temperature is 98.1 F (36.7 C). Her blood pressure is 108/68 and her pulse is 85. Her respiration is 20 and oxygen saturation is 100%.  No significant changes. Lungs are clear to auscultation bilaterally. Heart has regular rate and rhythm. No palpable cervical, supraclavicular, or axillary adenopathy. Abdomen soft, non-tender, normal bowel sounds. Left breast: no palpable mass, nipple discharge or bleeding.  Well-healed scars from recent surgery with removal of cosmetic implant Right breast: Periareolar and right lateral breast scar present which is healed well.  Cosmetic implant has been surgically removed.  No significant swelling noted in the breast or signs of infection.  Excellent cosmetic result at this point.  Lab Findings: Lab Results  Component Value Date   WBC 5.5 06/28/2020   HGB 13.9 06/28/2020   HCT 41.8 06/28/2020   MCV 95 06/28/2020   PLT 204 06/28/2020    Radiographic Findings: No results found.  Impression:  The encounter diagnosis was Ductal carcinoma in situ (DCIS) of right breast.    Stage 0 Right Breast LOQ, DCIS, ER+ / PR- / Grade 2 or 3  With reexcision and implant removal the patient now has clear surgical margins and is ready to proceed with adjuvant radiation therapy.  In light of the significant surgery recently with 2 excisions and implant removal as well as mastopexy I did not feel the she would be a good candidate for hypofractionated accelerated radiation therapy.  Would recommend conventional course of radiation therapy in this situation.  I discussed the general course of radiation therapy side effects and potential toxicities of this treatment.  Patient is well aware of these issues as she works as a Marine scientist in radiation oncology at [employer].  Plan:  She is scheduled for CT simulation later today.  Treatments to begin approximately 6  weeks postop.  Anticipate 6 and half weeks of radiation therapy  -----------------------------------  Blair Promise, PhD, MD  This document serves as a record of services personally performed by Gery Pray, MD. It was created on his behalf by Roney Mans, a trained medical scribe. The creation of this record is based on the scribe's personal observations and the provider's statements to them. This document has been checked and approved by the attending provider.

## 2020-08-08 ENCOUNTER — Ambulatory Visit
Admission: RE | Admit: 2020-08-08 | Discharge: 2020-08-08 | Disposition: A | Discharge status: Home / Self Care | Payer: PPO | Source: Ambulatory Visit | Attending: Radiation Oncology | Admitting: Radiation Oncology

## 2020-08-08 ENCOUNTER — Ambulatory Visit: Payer: PPO

## 2020-08-08 ENCOUNTER — Institutional Professional Consult (permissible substitution): Payer: PPO | Admitting: Radiation Oncology

## 2020-08-08 ENCOUNTER — Encounter: Payer: Self-pay | Admitting: Radiation Oncology

## 2020-08-08 ENCOUNTER — Ambulatory Visit: Admission: RE | Admit: 2020-08-08 | Payer: PPO | Source: Ambulatory Visit | Admitting: Radiation Oncology

## 2020-08-08 VITALS — BP 108/68 | HR 85 | Temp 98.1°F | Resp 20 | Ht 62.0 in | Wt 179.2 lb

## 2020-08-08 DIAGNOSIS — D0511 Intraductal carcinoma in situ of right breast: Secondary | ICD-10-CM | POA: Insufficient documentation

## 2020-08-08 DIAGNOSIS — Z923 Personal history of irradiation: Secondary | ICD-10-CM | POA: Insufficient documentation

## 2020-08-08 DIAGNOSIS — Z79899 Other long term (current) drug therapy: Secondary | ICD-10-CM | POA: Diagnosis not present

## 2020-08-08 DIAGNOSIS — Z510 Encounter for antineoplastic radiation therapy: Secondary | ICD-10-CM | POA: Diagnosis not present

## 2020-08-08 DIAGNOSIS — Z170 Estrogen receptor positive status [ER+]: Secondary | ICD-10-CM | POA: Diagnosis not present

## 2020-08-08 NOTE — Progress Notes (Signed)
See MD note for nursing evaluation. °

## 2020-08-12 NOTE — Progress Notes (Signed)
Chief Complaint:   OBESITY Kimberly Klein is here to discuss her progress with her obesity treatment plan along with follow-up of her obesity related diagnoses. Kimberly Klein is on the Category 2 Plan and states she is following her eating plan approximately 98% of the time. Kimberly Klein states she is doing 0 minutes 0 times per week.  Today's visit was #: 3 Starting weight: 179 lbs Starting date: 06/28/2020 Today's weight: 173 lbs Today's date: 08/07/2020 Total lbs lost to date: 6 lbs Total lbs lost since last in-office visit: 0  Interim History: Kimberly Klein had re-excision of breat biopsy on 08/10/2020 and got her surgical drains at last week. She wants to start walking for exercise. She will start radiation in the next few weeks.  She is a very good water drinker. She denies drinking sugar sweetened beverages.  Subjective:   1. Impaired fasting glucose/Prediabetes Kimberly Klein started Ozempic 0.25 mg. She has mild nausea. She denies constipation. Her last A1C was 5.7. She is working well on appetite suppression.  Lab Results  Component Value Date   HGBA1C 5.7 (H) 06/28/2020   Lab Results  Component Value Date   INSULIN 9.1 06/28/2020    2. Essential hypertension Babs's hypertension is well controlled with amlodipine 5 mg daily.   BP Readings from Last 3 Encounters:  08/08/20 108/68  08/07/20 116/73  07/17/20 (!) 148/79     Assessment/Plan:   1.Impaired fasting glucose/Prediabetes  Shakemia may increase Ozempic to 0.5 mg weekly. We will refill Ozempic today.  - Semaglutide,0.25 or 0.'5MG'$ /DOS, (OZEMPIC, 0.25 OR 0.5 MG/DOSE,) 2 MG/1.5ML SOPN; Inject 0.5 mg into the skin once a week.  Dispense: 1.5 mL; Refill: 0  2. Essential hypertension Kimberly Klein is working on healthy weight loss and exercise to improve blood pressure control. We will watch for signs of hypotension as she continues her lifestyle modifications. Kimberly Klein will continue amlodipine.   3. Obesity, current BMI 31.63 Kimberly Klein is  currently in the action stage of change. As such, her goal is to continue with weight loss efforts. She has agreed to the Category 2 Plan and keeping a food journal and adhering to recommended goals of 1200 calories and 75 grams of protein daily.  Exercise goals:  None.  Behavioral modification strategies: increasing lean protein intake.  Kimberly Klein has agreed to follow-up with our clinic in 3 weeks with Dr. Juleen China. Objective:   Blood pressure 116/73, pulse 78, temperature 98 F (36.7 C), height '5\' 2"'$  (1.575 m), weight 173 lb (78.5 kg), SpO2 98 %. Body mass index is 31.64 kg/m.  General: Cooperative, alert, well developed, in no acute distress. HEENT: Conjunctivae and lids unremarkable. Cardiovascular: Regular rhythm.  Lungs: Normal work of breathing. Neurologic: No focal deficits.   Lab Results  Component Value Date   CREATININE 0.92 06/28/2020   BUN 11 06/28/2020   NA 139 06/28/2020   K 4.8 06/28/2020   CL 100 06/28/2020   CO2 24 06/28/2020   Lab Results  Component Value Date   ALT 17 06/28/2020   AST 17 06/28/2020   ALKPHOS 99 06/28/2020   BILITOT 0.5 06/28/2020   Lab Results  Component Value Date   HGBA1C 5.7 (H) 06/28/2020   HGBA1C 5.3 09/27/2018   HGBA1C 4.9 05/30/2016   Lab Results  Component Value Date   INSULIN 9.1 06/28/2020   Lab Results  Component Value Date   TSH 0.998 06/28/2020   Lab Results  Component Value Date   CHOL 294 (H) 06/28/2020   HDL 83 06/28/2020  LDLCALC 193 (H) 06/28/2020   TRIG 105 06/28/2020   CHOLHDL 3.5 06/28/2020   Lab Results  Component Value Date   VD25OH 17.5 (L) 06/28/2020   Lab Results  Component Value Date   WBC 5.5 06/28/2020   HGB 13.9 06/28/2020   HCT 41.8 06/28/2020   MCV 95 06/28/2020   PLT 204 06/28/2020   Lab Results  Component Value Date   IRON 105 06/28/2020   TIBC 312 06/28/2020   FERRITIN 244 (H) 06/28/2020    Obesity Behavioral Intervention:   Approximately 15 minutes were spent on the  discussion below.  ASK: We discussed the diagnosis of obesity with Kimberly Klein today and Kimberly Klein agreed to give Kimberly Klein permission to discuss obesity behavioral modification therapy today.  ASSESS: Kimberly Klein has the diagnosis of obesity and her BMI today is 31.6. Kimberly Klein is in the action stage of change.   ADVISE: Kimberly Klein was educated on the multiple health risks of obesity as well as the benefit of weight loss to improve her health. She was advised of the need for long term treatment and the importance of lifestyle modifications to improve her current health and to decrease her risk of future health problems.  AGREE: Multiple dietary modification options and treatment options were discussed and Kimberly Klein agreed to follow the recommendations documented in the above note.  ARRANGE: Kimberly Klein was educated on the importance of frequent visits to treat obesity as outlined per CMS and USPSTF guidelines and agreed to schedule her next follow up appointment today.  Attestation Statements:   Reviewed by clinician on day of visit: allergies, medications, problem list, medical history, surgical history, family history, social history, and previous encounter notes.  I, Kimberly Klein, RMA, am acting as Location manager for [employer], [place].   I have reviewed the above documentation for accuracy and completeness, and I agree with the above. -  Georgianne Fick, FNP

## 2020-08-13 ENCOUNTER — Ambulatory Visit (INDEPENDENT_AMBULATORY_CARE_PROVIDER_SITE_OTHER): Payer: PPO

## 2020-08-13 VITALS — Ht 62.0 in | Wt 178.0 lb

## 2020-08-13 DIAGNOSIS — Z6832 Body mass index (BMI) 32.0-32.9, adult: Secondary | ICD-10-CM | POA: Insufficient documentation

## 2020-08-13 DIAGNOSIS — Z0000 Encounter for general adult medical examination without abnormal findings: Secondary | ICD-10-CM

## 2020-08-13 DIAGNOSIS — E669 Obesity, unspecified: Secondary | ICD-10-CM | POA: Insufficient documentation

## 2020-08-13 DIAGNOSIS — R7301 Impaired fasting glucose: Secondary | ICD-10-CM | POA: Insufficient documentation

## 2020-08-13 DIAGNOSIS — E66811 Obesity, class 1: Secondary | ICD-10-CM | POA: Insufficient documentation

## 2020-08-13 NOTE — Progress Notes (Signed)
I connected with Kimberly Klein today by telephone and verified that I am speaking with the correct person using two identifiers. Location patient: home Location provider: work Persons participating in the virtual visit: [place], Oh LPN.   I discussed the limitations, risks, security and privacy concerns of performing an evaluation and management service by telephone and the availability of in person appointments. I also discussed with the patient that there may be a patient responsible charge related to this service. The patient expressed understanding and verbally consented to this telephonic visit.    Interactive audio and video telecommunications were attempted between this provider and patient, however failed, due to patient having technical difficulties OR patient did not have access to video capability.  We continued and completed visit with audio only.     Vital signs may be patient reported or missing.  Subjective:   Kimberly Klein is a 70 y.o. female who presents for Medicare Annual (Subsequent) preventive examination.  Review of Systems     Cardiac Risk Factors include: advanced age (>90mn, >>58women);hypertension;obesity (BMI >30kg/m2)     Objective:    Today's Vitals   08/13/20 1341  Weight: 178 lb (80.7 kg)  Height: '5\' 2"'  (1.575 m)   Body mass index is 32.56 kg/m.  Advanced Directives 08/13/2020 08/08/2020 07/04/2020 05/02/2020 04/23/2020 04/27/2019 05/30/2016  Does Patient Have a Medical Advance Directive? Yes Yes Yes Yes Yes Yes No  Type of AParamed[place]of [place] will [place] will [place] will - - Living will;Healthcare Power of Attorney -  Does patient want to make changes to medical advance directive? - No - Patient declined - No - Patient declined No - Patient declined - -  Copy of [place]? No - copy requested No - copy requested - -  - No - copy requested -  Would patient like information on creating a medical advance directive? - - - - - - No - Patient declined    Current Medications (verified) Outpatient Encounter Medications as of 08/13/2020  Medication Sig   amLODipine (NORVASC) 5 MG tablet TAKE 1 TABLET(5 MG) BY MOUTH DAILY (Patient taking differently: Take 5 mg by mouth daily. TAKE 1 TABLET(5 MG) BY MOUTH DAILY)   lisinopril (ZESTRIL) 20 MG tablet Take 20 mg by mouth daily.   Multiple Vitamins-Minerals (PRESERVISION AREDS 2+MULTI VIT PO) Take 1 capsule by mouth in the morning and at bedtime.   Semaglutide,0.25 or 0.5MG/DOS, (OZEMPIC, 0.25 OR 0.5 MG/DOSE,) 2 MG/1.5ML SOPN Inject 0.5 mg into the skin once a week.   valACYclovir (VALTREX) 1000 MG tablet Take 1 tablet (1,000 mg total) by mouth 2 (two) times daily. (Patient taking differently: Take 1,000 mg by mouth 2 (two) times daily as needed (outbreaks).)   Vitamin D, Ergocalciferol, (DRISDOL) 1.25 MG (50000 UNIT) CAPS capsule Take 1 capsule (50,000 Units total) by mouth every 7 (seven) days.   ondansetron (ZOFRAN) 4 MG tablet Take 1 tablet (4 mg total) by mouth every 8 (eight) hours as needed for nausea or vomiting. (Patient not taking: Reported on 08/13/2020)   No facility-administered encounter medications on file as of 08/13/2020.    Allergies (verified) Patient has no known allergies.   History: Past Medical History:  Diagnosis Date   Back pain    [place]ast [place]   Dysrhythmia    SVT at times   Hx of cold sores    Hypertension 2019   Joint  pain    Macular degeneration    Palpitations    Sciatica    SVT (supraventricular tachycardia) ([place]) 07/2013   strees test- normal   Past Surgical History:  Procedure Laterality Date   appendectomy     APPENDECTOMY  1921   AUGMENTATION MAMMAPLASTY Bilateral    BREAST LUMPECTOMY WITH RADIOACTIVE SEED LOCALIZATION Right 05/02/2020   Procedure: RIGHT BREAST LUMPECTOMY WITH RADIOACTIVE SEED LOCALIZATION X  2;  Surgeon: [name], MD;  Location: [facility];  Service: General;  Laterality: Right;   EYE SURGERY Bilateral 2022   cataract surgery   MASTOPEXY Bilateral 07/11/2020   Procedure: BILATERAL MASTOPEXY AND REMOVAL OF IMPLANTS WITH COMPLETE CAPSULECTOMY;  Surgeon: Wallace Going, DO;  Location: [facility];  Service: Plastics;  Laterality: Bilateral;   RE-EXCISION OF BREAST LUMPECTOMY Right 07/11/2020   Procedure: RE-EXCISION OF RIGHT BREAST LUMPECTOMY;  Surgeon: [name], MD;  Location: [facility];  Service: General;  Laterality: Right;   Family History  Problem Relation Age of Onset   Depression Mother    Obesity Mother    Hypertension Father    Hyperlipidemia Father    Heart attack Father    Stroke Father    Heart disease Father    Liver cancer Sister    Bladder Cancer Brother    Multiple myeloma Brother    Social History   Socioeconomic History   Marital status: Widowed    Spouse name: Not on file   Number of children: Not on file   Years of education: Not on file   Highest education level: Not on file  Occupational History   Occupation: PRN RN   Tobacco Use   Smoking status: Former    Packs/day: 0.00    Years: 20.00    Pack years: 0.00    Types: Cigarettes    Quit date: 10/03/2007    Years since quitting: 12.8   Smokeless tobacco: Never  Vaping Use   Vaping Use: Never used  Substance and Sexual Activity   Alcohol use: Yes    Alcohol/week: 4.0 - 5.0 standard drinks    Types: 4 - 5 Glasses of wine per week    Comment: occasional   Drug use: No   Sexual activity: Not Currently    Birth control/protection: Post-menopausal  Other Topics Concern   Not on file  Social History Narrative   Not on file   Social Determinants of Health   Financial Resource Strain: Low Risk    Difficulty of Paying Living Expenses: Not hard at all  Food Insecurity: No Food Insecurity   Worried About Charity fundraiser in the Last Year: Never true   Ran Out of Food  in the Last Year: Never true  Transportation Needs: No Transportation Needs   Lack of Transportation (Medical): No   Lack of Transportation (Non-Medical): No  Physical Activity: Sufficiently Active   Days of Exercise per Week: 4 days   Minutes of Exercise per Session: 40 min  Stress: No Stress Concern Present   Feeling of Stress : Not at all  Social Connections: Not on file    Tobacco Counseling Counseling given: Not Answered   Clinical Intake:  Pre-visit preparation completed: Yes  Pain : No/denies pain     Nutritional Status: BMI > 30  Obese Nutritional Risks: None Diabetes: No  How often do you need to have someone help you when you read instructions, pamphlets, or other written materials from your doctor or pharmacy?: 1 -  Never What is the last grade level you completed in school?: college  Diabetic? no  Interpreter Needed?: No  Information entered by :: NAllen LPN   Activities of Daily Living In your present state of health, do you have any difficulty performing the following activities: 08/13/2020 07/04/2020  Hearing? N N  Vision? N N  Difficulty concentrating or making decisions? N N  Walking or climbing stairs? N N  Dressing or bathing? N N  Doing errands, shopping? N N  Preparing Food and eating ? N -  Using the Toilet? N -  In the past six months, have you accidently leaked urine? N -  Do you have problems with loss of bowel control? N -  Managing your Medications? N -  Managing your Finances? N -  Housekeeping or managing your Housekeeping? N -  Some recent data might be hidden    Patient Care Team: Valerie Roys, DO as PCP - General (Family Medicine) Mauro Kaufmann, RN as Oncology Nurse Navigator Rockwell Germany, RN as Oncology Nurse Navigator Magrinat, Virgie Dad, MD as Consulting Physician (Oncology) [name], MD as Consulting Physician (General Surgery) Gery Pray, MD as Consulting Physician (Radiation Oncology)  Indicate any  recent Winifred you may have received from other than [facility] providers in the past year (date may be approximate).     Assessment:   This is a routine wellness examination for [place].  Hearing/Vision screen Vision Screening - Comments:: Regular eye exams, Dr. Katy Fitch, Dr. Zigmund Daniel  Dietary issues and exercise activities discussed: Current Exercise Habits: Home exercise routine, Type of exercise: walking, Time (Minutes): 40, Frequency (Times/Week): 4, Weekly Exercise (Minutes/Week): 160   Goals Addressed             This Visit's Progress    Patient Stated       08/13/2020, wants to weigh130 pounds       Depression Screen PHQ 2/9 Scores 08/13/2020 06/28/2020 04/27/2019 05/30/2016  PHQ - 2 Score 0 6 0 0  PHQ- 9 Score - 14 - -    Fall Risk Fall Risk  08/13/2020 04/27/2019 12/08/2018 08/11/2018 05/30/2016  Falls in the past year? 0 0 0 (No Data) No  Comment - - Emmi Telephone Survey: data to providers prior to load Emmi Telephone Survey: data to providers prior to load -  Number falls in past yr: - 0 - (No Data) -  Comment - - - Emmi Telephone Survey Actual Response =  -  Injury with Fall? - 0 - - -  Risk for fall due to : Medication side effect - - - -  Follow up Falls evaluation completed;Education provided;Falls prevention discussed - - - -    FALL RISK PREVENTION PERTAINING TO THE HOME:  Any stairs in or around the home? Yes  If so, are there any without handrails? No  Home free of loose throw rugs in walkways, pet beds, electrical cords, etc? Yes  Adequate lighting in your home to reduce risk of falls? Yes   ASSISTIVE DEVICES UTILIZED TO PREVENT FALLS:  Life alert? No  Use of a cane, walker or w/c? No  Grab bars in the bathroom? Yes  Shower chair or bench in shower? Yes  Elevated toilet seat or a handicapped toilet? Yes   TIMED UP AND GO:  Was the test performed? No .       Cognitive Function:     6CIT Screen 08/13/2020 05/30/2016  What Year? 0 points 0 points  What month? 0 points 0 points  What time? 0 points 0 points  Count back from 20 2 points 0 points  Months in reverse 0 points 0 points  Repeat phrase 0 points 0 points  Total Score 2 0    Immunizations Immunization History  Administered Date(s) Administered   Influenza-Unspecified 09/28/2014, 10/24/2019   PFIZER(Purple Top)SARS-COV-2 Vaccination 01/04/2019, 01/25/2019   Pneumococcal Conjugate-13 10/28/2013   Pneumococcal Polysaccharide-23 09/27/2018    TDAP status: Due, Education has been provided regarding the importance of this vaccine. Advised may receive this vaccine at local pharmacy or Health Dept. Aware to provide a copy of the vaccination record if obtained from local pharmacy or Health Dept. Verbalized acceptance and understanding.  Flu Vaccine status: Up to date  Pneumococcal vaccine status: Up to date  Covid-19 vaccine status: Completed vaccines  Qualifies for Shingles Vaccine? Yes   Zostavax completed No   Shingrix Completed?: No.    Education has been provided regarding the importance of this vaccine. Patient has been advised to call insurance company to determine out of pocket expense if they have not yet received this vaccine. Advised may also receive vaccine at local pharmacy or Health Dept. Verbalized acceptance and understanding.  Screening Tests Health Maintenance  Topic Date Due   TETANUS/TDAP  Never done   Zoster Vaccines- Shingrix (1 of 2) Never done   COVID-19 Vaccine (3 - Pfizer risk series) 02/22/2019   INFLUENZA VACCINE  08/13/2020   MAMMOGRAM  03/21/2022   COLONOSCOPY (Pts 45-83yr Insurance coverage will need to be confirmed)  12/14/2023   DEXA SCAN  Completed   Hepatitis C Screening  Completed   PNA vac Low Risk Adult  Completed   HPV VACCINES  Aged Out    Health Maintenance  Health Maintenance Due  Topic Date Due   TETANUS/TDAP  Never done   Zoster Vaccines- Shingrix (1 of 2) Never done   COVID-19 Vaccine (3 - Pfizer risk series)  02/22/2019   INFLUENZA VACCINE  08/13/2020    Colorectal cancer screening: Type of screening: Colonoscopy. Completed 12/13/2013. Repeat every 10 years  Mammogram status: Completed 03/20/2020. Repeat every year  Bone Density status: Completed 03/07/2008.   Lung Cancer Screening: (Low Dose CT Chest recommended if Age 10336-80years, 30 pack-year currently smoking OR have quit w/in 15years.) does not qualify.   Lung Cancer Screening Referral: no  Additional Screening:  Hepatitis C Screening: does qualify; Completed 04/04/2019  Vision Screening: Recommended annual ophthalmology exams for early detection of glaucoma and other disorders of the eye. Is the patient up to date with their annual eye exam?  Yes  Who is the provider or what is the name of the office in which the patient attends annual eye exams? Dr. GKaty Fitch, Dr. MZigmund DanielIf pt is not established with a provider, would they like to be referred to a provider to establish care? No .   Dental Screening: Recommended annual dental exams for proper oral hygiene  Community Resource Referral / Chronic Care Management: CRR required this visit?  No   CCM required this visit?  No      Plan:     I have personally reviewed and noted the following in the patient's chart:   Medical and social history Use of alcohol, tobacco or illicit drugs  Current medications and supplements including opioid prescriptions.  Functional ability and status Nutritional status Physical activity Advanced directives List of other physicians Hospitalizations, surgeries, and ER visits in previous 12 months Vitals Screenings to  include cognitive, depression, and falls Referrals and appointments  In addition, I have reviewed and discussed with patient certain preventive protocols, quality metrics, and best practice recommendations. A written personalized care plan for preventive services as well as general preventive health recommendations were provided to  patient.     Kellie Simmering, LPN   08/21/2798   Nurse Notes:

## 2020-08-13 NOTE — Patient Instructions (Signed)
Kimberly Klein , Thank you for taking time to come for your Medicare Wellness Visit. I appreciate your ongoing commitment to your health goals. Please review the following plan we discussed and let me know if I can assist you in the future.   Screening recommendations/referrals: Colonoscopy: completed 12/13/2013 Mammogram: completed 03/20/2020 Bone Density: completed 03/07/2008 Recommended yearly ophthalmology/optometry visit for glaucoma screening and checkup Recommended yearly dental visit for hygiene and checkup  Vaccinations: Influenza vaccine: due Pneumococcal vaccine: completed 09/27/2018 Tdap vaccine: due Shingles vaccine: discussed   Covid-19: 01/25/2019, 01/02/2019. Will bring card for other dates  Advanced directives: Please bring a copy of your POA (Power of [place]) and/or Living Will to your next appointment.   Conditions/risks identified: none  Next appointment: Follow up in one year for your annual wellness visit    Preventive Care 65 Years and Older, Female Preventive care refers to lifestyle choices and visits with your health care provider that can promote health and wellness. What does preventive care include? A yearly physical exam. This is also called an annual well check. Dental exams once or twice a year. Routine eye exams. Ask your health care provider how often you should have your eyes checked. Personal lifestyle choices, including: Daily care of your teeth and gums. Regular physical activity. Eating a healthy diet. Avoiding tobacco and drug use. Limiting alcohol use. Practicing safe sex. Taking low-dose aspirin every day. Taking vitamin and mineral supplements as recommended by your health care provider. What happens during an annual well check? The services and screenings done by your health care provider during your annual well check will depend on your age, overall health, lifestyle risk factors, and family history of disease. Counseling  Your health  care provider may ask you questions about your: Alcohol use. Tobacco use. Drug use. Emotional well-being. Home and relationship well-being. Sexual activity. Eating habits. History of falls. Memory and ability to understand (cognition). Work and work Statistician. Reproductive health. Screening  You may have the following tests or measurements: Height, weight, and BMI. Blood pressure. Lipid and cholesterol levels. These may be checked every 5 years, or more frequently if you are over 90 years old. Skin check. Lung cancer screening. You may have this screening every year starting at age 34 if you have a 30-pack-year history of smoking and currently smoke or have quit within the past 15 years. Fecal occult blood test (FOBT) of the stool. You may have this test every year starting at age 89. Flexible sigmoidoscopy or colonoscopy. You may have a sigmoidoscopy every 5 years or a colonoscopy every 10 years starting at age 34. Hepatitis C blood test. Hepatitis B blood test. Sexually transmitted disease (STD) testing. Diabetes screening. This is done by checking your blood sugar (glucose) after you have not eaten for a while (fasting). You may have this done every 1-3 years. Bone density scan. This is done to screen for osteoporosis. You may have this done starting at age 77. Mammogram. This may be done every 1-2 years. Talk to your health care provider about how often you should have regular mammograms. Talk with your health care provider about your test results, treatment options, and if necessary, the need for more tests. Vaccines  Your health care provider may recommend certain vaccines, such as: Influenza vaccine. This is recommended every year. Tetanus, diphtheria, and acellular pertussis (Tdap, Td) vaccine. You may need a Td booster every 10 years. Zoster vaccine. You may need this after age 38. Pneumococcal 13-valent conjugate (PCV13) vaccine. One  dose is recommended after age  21. Pneumococcal polysaccharide (PPSV23) vaccine. One dose is recommended after age 20. Talk to your health care provider about which screenings and vaccines you need and how often you need them. This information is not intended to replace advice given to you by your health care provider. Make sure you discuss any questions you have with your health care provider. Document Released: 01/26/2015 Document Revised: 09/19/2015 Document Reviewed: 10/31/2014 Elsevier Interactive Patient Education  2017 [employer] [place] Prevention in the Home Falls can cause injuries. They can happen to people of all ages. There are many things you can do to make your home safe and to help prevent falls. What can I do on the outside of my home? Regularly fix the edges of walkways and driveways and fix any cracks. Remove anything that might make you trip as you walk through a door, such as a raised step or threshold. Trim any bushes or trees on the path to your home. Use bright outdoor lighting. Clear any walking paths of anything that might make someone trip, such as rocks or tools. Regularly check to see if handrails are loose or broken. Make sure that both sides of any steps have handrails. Any raised decks and porches should have guardrails on the edges. Have any leaves, snow, or ice cleared regularly. Use sand or salt on walking paths during winter. Clean up any spills in your garage right away. This includes oil or grease spills. What can I do in the bathroom? Use night lights. Install grab bars by the toilet and in the tub and shower. Do not use towel bars as grab bars. Use non-skid mats or decals in the tub or shower. If you need to sit down in the shower, use a plastic, non-slip stool. Keep the floor dry. Clean up any water that spills on the floor as soon as it happens. Remove soap buildup in the tub or shower regularly. Attach bath mats securely with double-sided non-slip rug tape. Do not have throw  rugs and other things on the floor that can make you trip. What can I do in the bedroom? Use night lights. Make sure that you have a light by your bed that is easy to reach. Do not use any sheets or blankets that are too big for your bed. They should not hang down onto the floor. Have a firm chair that has side arms. You can use this for support while you get dressed. Do not have throw rugs and other things on the floor that can make you trip. What can I do in the kitchen? Clean up any spills right away. Avoid walking on wet floors. Keep items that you use a lot in easy-to-reach places. If you need to reach something above you, use a strong step stool that has a grab bar. Keep electrical cords out of the way. Do not use floor polish or wax that makes floors slippery. If you must use wax, use non-skid floor wax. Do not have throw rugs and other things on the floor that can make you trip. What can I do with my stairs? Do not leave any items on the stairs. Make sure that there are handrails on both sides of the stairs and use them. Fix handrails that are broken or loose. Make sure that handrails are as long as the stairways. Check any carpeting to make sure that it is firmly attached to the stairs. Fix any carpet that is loose or worn.  Avoid having throw rugs at the top or bottom of the stairs. If you do have throw rugs, attach them to the floor with carpet tape. Make sure that you have a light switch at the top of the stairs and the bottom of the stairs. If you do not have them, ask someone to add them for you. What else can I do to help prevent falls? Wear shoes that: Do not have high heels. Have rubber bottoms. Are comfortable and fit you well. Are closed at the toe. Do not wear sandals. If you use a stepladder: Make sure that it is fully opened. Do not climb a closed stepladder. Make sure that both sides of the stepladder are locked into place. Ask someone to hold it for you, if  possible. Clearly mark and make sure that you can see: Any grab bars or handrails. First and last steps. Where the edge of each step is. Use tools that help you move around (mobility aids) if they are needed. These include: Canes. Walkers. Scooters. Crutches. Turn on the lights when you go into a dark area. Replace any light bulbs as soon as they burn out. Set up your furniture so you have a clear path. Avoid moving your furniture around. If any of your floors are uneven, fix them. If there are any pets around you, be aware of where they are. Review your medicines with your doctor. Some medicines can make you feel dizzy. This can increase your chance of falling. Ask your doctor what other things that you can do to help prevent falls. This information is not intended to replace advice given to you by your health care provider. Make sure you discuss any questions you have with your health care provider. Document Released: 10/26/2008 Document Revised: 06/07/2015 Document Reviewed: 02/03/2014 Elsevier Interactive Patient Education  2017 [employer].

## 2020-08-14 ENCOUNTER — Encounter: Payer: Self-pay | Admitting: *Deleted

## 2020-08-14 ENCOUNTER — Other Ambulatory Visit: Payer: Self-pay

## 2020-08-14 ENCOUNTER — Ambulatory Visit (INDEPENDENT_AMBULATORY_CARE_PROVIDER_SITE_OTHER): Payer: PPO | Admitting: Surgical

## 2020-08-14 ENCOUNTER — Encounter: Payer: Self-pay | Admitting: Surgical

## 2020-08-14 DIAGNOSIS — D0511 Intraductal carcinoma in situ of right breast: Secondary | ICD-10-CM

## 2020-08-14 NOTE — Progress Notes (Signed)
Patient is a 70 year old female here for follow-up after reexcision of right breast lumpectomy for right breast cancer followed by bilateral mastopexy and removal of bilateral breast implants with complete capsulectomy by Dr. Marla Roe on 07/11/2020.  She is 5 weeks postop.  Reports today that she is doing really well, she is very pleased with how things are healing.  She is going to begin radiation soon for a total of 6 weeks.  She has some questions about returning to yoga  Chaperone present on exam On exam bilateral NAC's are viable, bilateral breast incisions are intact.  No subcutaneous fluid collections noted with palpation.  No erythema or cellulitic changes noted.  Radiation markers noted on right breast.  No wounds are noted.  Recommend continue her compressive garment 24/7 for more week, then can transition to wearing just during today. Recommend avoiding normal bra for total of 3 months.  Avoid bra with underwire. Recommend following up 2 to 3 months after completion of radiation.  Recommend calling with questions or concerns or if she notices any concerning symptoms or has questions or concerns prior.  Picture was taken and placed in chart with patient's permission.

## 2020-08-15 ENCOUNTER — Ambulatory Visit: Payer: PPO | Admitting: Radiation Oncology

## 2020-08-15 DIAGNOSIS — D0511 Intraductal carcinoma in situ of right breast: Secondary | ICD-10-CM | POA: Diagnosis not present

## 2020-08-15 DIAGNOSIS — Z510 Encounter for antineoplastic radiation therapy: Secondary | ICD-10-CM | POA: Insufficient documentation

## 2020-08-16 ENCOUNTER — Ambulatory Visit: Payer: PPO

## 2020-08-17 ENCOUNTER — Ambulatory Visit: Payer: PPO

## 2020-08-20 ENCOUNTER — Ambulatory Visit
Admission: RE | Admit: 2020-08-20 | Discharge: 2020-08-20 | Disposition: A | Discharge status: Home / Self Care | Payer: PPO | Source: Ambulatory Visit | Attending: Radiation Oncology | Admitting: Radiation Oncology

## 2020-08-20 ENCOUNTER — Other Ambulatory Visit: Payer: Self-pay

## 2020-08-20 ENCOUNTER — Ambulatory Visit: Payer: PPO

## 2020-08-20 DIAGNOSIS — D0511 Intraductal carcinoma in situ of right breast: Secondary | ICD-10-CM | POA: Diagnosis not present

## 2020-08-20 DIAGNOSIS — Z510 Encounter for antineoplastic radiation therapy: Secondary | ICD-10-CM | POA: Diagnosis not present

## 2020-08-20 MED ORDER — RADIAPLEXRX EX GEL
Freq: Once | CUTANEOUS | Status: AC
Start: 2020-08-20 — End: 2020-08-20

## 2020-08-20 MED ORDER — ALRA NON-METALLIC DEODORANT (RAD-ONC)
1.0000 | Freq: Once | TOPICAL | Status: AC
Start: 2020-08-20 — End: 2020-08-20
  Administered 2020-08-20: 1 via TOPICAL

## 2020-08-21 ENCOUNTER — Other Ambulatory Visit: Payer: Self-pay

## 2020-08-21 ENCOUNTER — Ambulatory Visit
Admission: RE | Admit: 2020-08-21 | Discharge: 2020-08-21 | Disposition: A | Discharge status: Home / Self Care | Payer: PPO | Source: Ambulatory Visit | Attending: Radiation Oncology | Admitting: Radiation Oncology

## 2020-08-21 DIAGNOSIS — D0511 Intraductal carcinoma in situ of right breast: Secondary | ICD-10-CM | POA: Diagnosis not present

## 2020-08-21 DIAGNOSIS — Z510 Encounter for antineoplastic radiation therapy: Secondary | ICD-10-CM | POA: Diagnosis not present

## 2020-08-22 ENCOUNTER — Ambulatory Visit
Admission: RE | Admit: 2020-08-22 | Discharge: 2020-08-22 | Disposition: A | Discharge status: Home / Self Care | Payer: PPO | Source: Ambulatory Visit | Attending: Radiation Oncology | Admitting: Radiation Oncology

## 2020-08-22 ENCOUNTER — Other Ambulatory Visit: Payer: Self-pay

## 2020-08-22 ENCOUNTER — Other Ambulatory Visit (INDEPENDENT_AMBULATORY_CARE_PROVIDER_SITE_OTHER): Payer: Self-pay | Admitting: Family Medicine

## 2020-08-22 DIAGNOSIS — Z510 Encounter for antineoplastic radiation therapy: Secondary | ICD-10-CM | POA: Diagnosis not present

## 2020-08-22 DIAGNOSIS — D0511 Intraductal carcinoma in situ of right breast: Secondary | ICD-10-CM | POA: Diagnosis not present

## 2020-08-22 DIAGNOSIS — R7301 Impaired fasting glucose: Secondary | ICD-10-CM

## 2020-08-23 ENCOUNTER — Ambulatory Visit
Admission: RE | Admit: 2020-08-23 | Discharge: 2020-08-23 | Disposition: A | Discharge status: Home / Self Care | Payer: PPO | Source: Ambulatory Visit | Attending: Radiation Oncology | Admitting: Radiation Oncology

## 2020-08-23 DIAGNOSIS — Z510 Encounter for antineoplastic radiation therapy: Secondary | ICD-10-CM | POA: Diagnosis not present

## 2020-08-23 DIAGNOSIS — D0511 Intraductal carcinoma in situ of right breast: Secondary | ICD-10-CM | POA: Diagnosis not present

## 2020-08-24 ENCOUNTER — Other Ambulatory Visit: Payer: Self-pay

## 2020-08-24 ENCOUNTER — Ambulatory Visit
Admission: RE | Admit: 2020-08-24 | Discharge: 2020-08-24 | Disposition: A | Discharge status: Home / Self Care | Payer: PPO | Source: Ambulatory Visit | Attending: Radiation Oncology | Admitting: Radiation Oncology

## 2020-08-24 DIAGNOSIS — Z510 Encounter for antineoplastic radiation therapy: Secondary | ICD-10-CM | POA: Diagnosis not present

## 2020-08-24 DIAGNOSIS — D0511 Intraductal carcinoma in situ of right breast: Secondary | ICD-10-CM | POA: Diagnosis not present

## 2020-08-27 ENCOUNTER — Other Ambulatory Visit: Payer: Self-pay

## 2020-08-27 ENCOUNTER — Ambulatory Visit
Admission: RE | Admit: 2020-08-27 | Discharge: 2020-08-27 | Disposition: A | Discharge status: Home / Self Care | Payer: PPO | Source: Ambulatory Visit | Attending: Radiation Oncology | Admitting: Radiation Oncology

## 2020-08-27 DIAGNOSIS — Z510 Encounter for antineoplastic radiation therapy: Secondary | ICD-10-CM | POA: Diagnosis not present

## 2020-08-27 DIAGNOSIS — D0511 Intraductal carcinoma in situ of right breast: Secondary | ICD-10-CM | POA: Diagnosis not present

## 2020-08-28 ENCOUNTER — Encounter (INDEPENDENT_AMBULATORY_CARE_PROVIDER_SITE_OTHER): Payer: Self-pay | Admitting: Family Medicine

## 2020-08-28 ENCOUNTER — Ambulatory Visit
Admission: RE | Admit: 2020-08-28 | Discharge: 2020-08-28 | Disposition: A | Discharge status: Home / Self Care | Payer: PPO | Source: Ambulatory Visit | Attending: Radiation Oncology | Admitting: Radiation Oncology

## 2020-08-28 ENCOUNTER — Ambulatory Visit (INDEPENDENT_AMBULATORY_CARE_PROVIDER_SITE_OTHER): Payer: PPO | Admitting: Family Medicine

## 2020-08-28 VITALS — HR 74 | Temp 97.7°F | Ht 62.0 in | Wt 171.0 lb

## 2020-08-28 DIAGNOSIS — F3289 Other specified depressive episodes: Secondary | ICD-10-CM | POA: Diagnosis not present

## 2020-08-28 DIAGNOSIS — R7301 Impaired fasting glucose: Secondary | ICD-10-CM

## 2020-08-28 DIAGNOSIS — D0511 Intraductal carcinoma in situ of right breast: Secondary | ICD-10-CM | POA: Diagnosis not present

## 2020-08-28 DIAGNOSIS — Z510 Encounter for antineoplastic radiation therapy: Secondary | ICD-10-CM | POA: Diagnosis not present

## 2020-08-28 DIAGNOSIS — Z6832 Body mass index (BMI) 32.0-32.9, adult: Secondary | ICD-10-CM

## 2020-08-28 DIAGNOSIS — E669 Obesity, unspecified: Secondary | ICD-10-CM | POA: Diagnosis not present

## 2020-08-29 ENCOUNTER — Other Ambulatory Visit: Payer: Self-pay

## 2020-08-29 ENCOUNTER — Ambulatory Visit
Admission: RE | Admit: 2020-08-29 | Discharge: 2020-08-29 | Disposition: A | Discharge status: Home / Self Care | Payer: PPO | Source: Ambulatory Visit | Attending: Radiation Oncology | Admitting: Radiation Oncology

## 2020-08-29 DIAGNOSIS — D0511 Intraductal carcinoma in situ of right breast: Secondary | ICD-10-CM | POA: Diagnosis not present

## 2020-08-29 DIAGNOSIS — Z510 Encounter for antineoplastic radiation therapy: Secondary | ICD-10-CM | POA: Diagnosis not present

## 2020-08-29 NOTE — Progress Notes (Signed)
Chief Complaint:   OBESITY Kimberly Klein is here to discuss her progress with her obesity treatment plan along with follow-up of her obesity related diagnoses.   Today's visit was #: 4 Starting weight: 179 lbs Starting date: 06/28/2020 Today's weight: 171 lbs Today's date: 08/28/2020 Weight change since last visit: -2 lbs Total lbs lost to date: 8 lbs Body mass index is 31.28 kg/m.  Total weight loss percentage to date: -4.47%  Current Meal Plan: the Category 2 Plan and keeping a food journal and adhering to recommended goals of 1200 calories and 75 grams of protein for 99% of the time.  Current Exercise Plan: Walking for 40 minutes 7 times per week. Current Anti-Obesity Medications: Ozempic 0.25 mg subcutaneously weekly. Side effects: None.  Interim History: Anette is still on the 0.25 mg Ozempic, took her last dose on Saturday. She says "I don't daydream about food anymore". She is currently undergoing radiation and is still feeling good.   Assessment/Plan:   1. Impaired fasting glucose, with polyphagia Not optimized. Goal is HgbA1c < 5.7.  Medication: Ozempic 0.25 mg subcutaneously weekly.     Plan: Start Ozempic 0.5 mg subcutaneously weekly.  She will continue to focus on protein-rich, low simple carbohydrate foods. We reviewed the importance of hydration, regular exercise for stress reduction, and restorative sleep.   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 Recent notes reviewed. We will continue to monitor symptoms as they relate to her weight loss journey.  3. Emotional eating tendencies Not at goal. Medication: None.     Plan: Behavior modification techniques were discussed today to help deal with emotional/non-hunger eating behaviors.  4. Obesity, current BMI 31.3 Course: Hayven is currently in the action stage of change. As such, her goal is to continue with weight loss efforts.   Nutrition goals: She has agreed to the Category 2  Plan and keeping a food journal and adhering to recommended goals of 1200 calories and 75 grams of protein.  Okay to have a protein drink daily.   Exercise goals: As is.  Behavioral modification strategies: increasing lean protein intake, decreasing simple carbohydrates, increasing vegetables, increasing water intake, and emotional eating strategies.  Merica has agreed to follow-up with our clinic in 4 weeks. She was informed of the importance of frequent follow-up visits to maximize her success with intensive lifestyle modifications for her multiple health conditions.   Objective:   Pulse 74, temperature 97.7 F (36.5 C), height '5\' 2"'$  (1.575 m), weight 171 lb (77.6 kg), SpO2 97 %. Body mass index is 31.28 kg/m.  General: Cooperative, alert, well developed, in no acute distress. HEENT: Conjunctivae and lids unremarkable. Cardiovascular: Regular rhythm.  Lungs: Normal work of breathing. Neurologic: No focal deficits.   Lab Results  Component Value Date   CREATININE 0.92 06/28/2020   BUN 11 06/28/2020   NA 139 06/28/2020   K 4.8 06/28/2020   CL 100 06/28/2020   CO2 24 06/28/2020   Lab Results  Component Value Date   ALT 17 06/28/2020   AST 17 06/28/2020   ALKPHOS 99 06/28/2020   BILITOT 0.5 06/28/2020   Lab Results  Component Value Date   HGBA1C 5.7 (H) 06/28/2020   HGBA1C 5.3 09/27/2018   HGBA1C 4.9 05/30/2016   Lab Results  Component Value Date   INSULIN 9.1 06/28/2020   Lab Results  Component Value Date   TSH 0.998 06/28/2020   Lab Results  Component Value Date   CHOL  294 (H) 06/28/2020   HDL 83 06/28/2020   LDLCALC 193 (H) 06/28/2020   TRIG 105 06/28/2020   CHOLHDL 3.5 06/28/2020   Lab Results  Component Value Date   VD25OH 17.5 (L) 06/28/2020   Lab Results  Component Value Date   WBC 5.5 06/28/2020   HGB 13.9 06/28/2020   HCT 41.8 06/28/2020   MCV 95 06/28/2020   PLT 204 06/28/2020   Lab Results  Component Value Date   IRON 105 06/28/2020    TIBC 312 06/28/2020   FERRITIN 244 (H) 06/28/2020   Obesity Behavioral Intervention:   Approximately 15 minutes were spent on the discussion below.  ASK: We discussed the diagnosis of obesity with Desmond today and Janiesha agreed to give [place] permission to discuss obesity behavioral modification therapy today.  ASSESS: Tamika has the diagnosis of obesity and her BMI today is 31.3. Valisha is in the action stage of change.   ADVISE: Edynn was educated on the multiple health risks of obesity as well as the benefit of weight loss to improve her health. She was advised of the need for long term treatment and the importance of lifestyle modifications to improve her current health and to decrease her risk of future health problems.  AGREE: Multiple dietary modification options and treatment options were discussed and Ceren agreed to follow the recommendations documented in the above note.  ARRANGE: Kamaljit was educated on the importance of frequent visits to treat obesity as outlined per CMS and USPSTF guidelines and agreed to schedule her next follow up appointment today.  Attestation Statements:   Reviewed by clinician on day of visit: allergies, medications, problem list, medical history, surgical history, family history, social history, and previous encounter notes.  Leodis Binet Friedenbach, CMA, am acting as Location manager for [employer], DO.  I have reviewed the above documentation for accuracy and completeness, and I agree with the above. Briscoe Deutscher, DO

## 2020-08-30 ENCOUNTER — Ambulatory Visit
Admission: RE | Admit: 2020-08-30 | Discharge: 2020-08-30 | Disposition: A | Discharge status: Home / Self Care | Payer: PPO | Source: Ambulatory Visit | Attending: Radiation Oncology | Admitting: Radiation Oncology

## 2020-08-30 DIAGNOSIS — Z510 Encounter for antineoplastic radiation therapy: Secondary | ICD-10-CM | POA: Diagnosis not present

## 2020-08-30 DIAGNOSIS — D0511 Intraductal carcinoma in situ of right breast: Secondary | ICD-10-CM | POA: Diagnosis not present

## 2020-08-31 ENCOUNTER — Other Ambulatory Visit: Payer: Self-pay

## 2020-08-31 ENCOUNTER — Ambulatory Visit
Admission: RE | Admit: 2020-08-31 | Discharge: 2020-08-31 | Disposition: A | Discharge status: Home / Self Care | Payer: PPO | Source: Ambulatory Visit | Attending: Radiation Oncology | Admitting: Radiation Oncology

## 2020-08-31 DIAGNOSIS — Z510 Encounter for antineoplastic radiation therapy: Secondary | ICD-10-CM | POA: Diagnosis not present

## 2020-08-31 DIAGNOSIS — D0511 Intraductal carcinoma in situ of right breast: Secondary | ICD-10-CM | POA: Diagnosis not present

## 2020-09-03 ENCOUNTER — Encounter: Payer: PPO | Admitting: Family Medicine

## 2020-09-03 ENCOUNTER — Ambulatory Visit
Admission: RE | Admit: 2020-09-03 | Discharge: 2020-09-03 | Disposition: A | Discharge status: Home / Self Care | Payer: PPO | Source: Ambulatory Visit | Attending: Radiation Oncology | Admitting: Radiation Oncology

## 2020-09-03 ENCOUNTER — Other Ambulatory Visit: Payer: Self-pay

## 2020-09-03 DIAGNOSIS — Z510 Encounter for antineoplastic radiation therapy: Secondary | ICD-10-CM | POA: Diagnosis not present

## 2020-09-03 DIAGNOSIS — D0511 Intraductal carcinoma in situ of right breast: Secondary | ICD-10-CM | POA: Diagnosis not present

## 2020-09-04 ENCOUNTER — Ambulatory Visit
Admission: RE | Admit: 2020-09-04 | Discharge: 2020-09-04 | Disposition: A | Discharge status: Home / Self Care | Payer: PPO | Source: Ambulatory Visit | Attending: Radiation Oncology | Admitting: Radiation Oncology

## 2020-09-04 DIAGNOSIS — Z510 Encounter for antineoplastic radiation therapy: Secondary | ICD-10-CM | POA: Diagnosis not present

## 2020-09-04 DIAGNOSIS — D0511 Intraductal carcinoma in situ of right breast: Secondary | ICD-10-CM | POA: Diagnosis not present

## 2020-09-05 ENCOUNTER — Other Ambulatory Visit: Payer: Self-pay

## 2020-09-05 ENCOUNTER — Ambulatory Visit
Admission: RE | Admit: 2020-09-05 | Discharge: 2020-09-05 | Disposition: A | Discharge status: Home / Self Care | Payer: PPO | Source: Ambulatory Visit | Attending: Radiation Oncology | Admitting: Radiation Oncology

## 2020-09-05 DIAGNOSIS — D0511 Intraductal carcinoma in situ of right breast: Secondary | ICD-10-CM | POA: Diagnosis not present

## 2020-09-05 DIAGNOSIS — Z510 Encounter for antineoplastic radiation therapy: Secondary | ICD-10-CM | POA: Diagnosis not present

## 2020-09-06 ENCOUNTER — Ambulatory Visit
Admission: RE | Admit: 2020-09-06 | Discharge: 2020-09-06 | Disposition: A | Discharge status: Home / Self Care | Payer: PPO | Source: Ambulatory Visit | Attending: Radiation Oncology | Admitting: Radiation Oncology

## 2020-09-06 DIAGNOSIS — Z510 Encounter for antineoplastic radiation therapy: Secondary | ICD-10-CM | POA: Diagnosis not present

## 2020-09-06 DIAGNOSIS — D0511 Intraductal carcinoma in situ of right breast: Secondary | ICD-10-CM | POA: Diagnosis not present

## 2020-09-07 ENCOUNTER — Ambulatory Visit
Admission: RE | Admit: 2020-09-07 | Discharge: 2020-09-07 | Disposition: A | Discharge status: Home / Self Care | Payer: PPO | Source: Ambulatory Visit | Attending: Radiation Oncology | Admitting: Radiation Oncology

## 2020-09-07 ENCOUNTER — Other Ambulatory Visit: Payer: Self-pay

## 2020-09-07 DIAGNOSIS — Z510 Encounter for antineoplastic radiation therapy: Secondary | ICD-10-CM | POA: Diagnosis not present

## 2020-09-07 DIAGNOSIS — D0511 Intraductal carcinoma in situ of right breast: Secondary | ICD-10-CM | POA: Diagnosis not present

## 2020-09-10 ENCOUNTER — Other Ambulatory Visit: Payer: Self-pay

## 2020-09-10 ENCOUNTER — Encounter: Payer: PPO | Admitting: Family Medicine

## 2020-09-10 ENCOUNTER — Ambulatory Visit
Admission: RE | Admit: 2020-09-10 | Discharge: 2020-09-10 | Disposition: A | Discharge status: Home / Self Care | Payer: PPO | Source: Ambulatory Visit | Attending: Radiation Oncology | Admitting: Radiation Oncology

## 2020-09-10 DIAGNOSIS — Z510 Encounter for antineoplastic radiation therapy: Secondary | ICD-10-CM | POA: Diagnosis not present

## 2020-09-10 DIAGNOSIS — D0511 Intraductal carcinoma in situ of right breast: Secondary | ICD-10-CM | POA: Diagnosis not present

## 2020-09-11 ENCOUNTER — Ambulatory Visit
Admission: RE | Admit: 2020-09-11 | Discharge: 2020-09-11 | Disposition: A | Discharge status: Home / Self Care | Payer: PPO | Source: Ambulatory Visit | Attending: Radiation Oncology | Admitting: Radiation Oncology

## 2020-09-11 DIAGNOSIS — D0511 Intraductal carcinoma in situ of right breast: Secondary | ICD-10-CM | POA: Diagnosis not present

## 2020-09-11 DIAGNOSIS — Z510 Encounter for antineoplastic radiation therapy: Secondary | ICD-10-CM | POA: Diagnosis not present

## 2020-09-12 ENCOUNTER — Other Ambulatory Visit: Payer: Self-pay

## 2020-09-12 ENCOUNTER — Ambulatory Visit
Admission: RE | Admit: 2020-09-12 | Discharge: 2020-09-12 | Disposition: A | Discharge status: Home / Self Care | Payer: PPO | Source: Ambulatory Visit | Attending: Radiation Oncology | Admitting: Radiation Oncology

## 2020-09-12 DIAGNOSIS — Z510 Encounter for antineoplastic radiation therapy: Secondary | ICD-10-CM | POA: Diagnosis not present

## 2020-09-12 DIAGNOSIS — D0511 Intraductal carcinoma in situ of right breast: Secondary | ICD-10-CM | POA: Diagnosis not present

## 2020-09-13 ENCOUNTER — Ambulatory Visit
Admission: RE | Admit: 2020-09-13 | Discharge: 2020-09-13 | Disposition: A | Discharge status: Home / Self Care | Payer: PPO | Source: Ambulatory Visit | Attending: Radiation Oncology | Admitting: Radiation Oncology

## 2020-09-13 DIAGNOSIS — D0511 Intraductal carcinoma in situ of right breast: Secondary | ICD-10-CM | POA: Diagnosis not present

## 2020-09-14 ENCOUNTER — Ambulatory Visit
Admission: RE | Admit: 2020-09-14 | Discharge: 2020-09-14 | Disposition: A | Discharge status: Home / Self Care | Payer: PPO | Source: Ambulatory Visit | Attending: Radiation Oncology | Admitting: Radiation Oncology

## 2020-09-14 ENCOUNTER — Other Ambulatory Visit: Payer: Self-pay

## 2020-09-14 DIAGNOSIS — D0511 Intraductal carcinoma in situ of right breast: Secondary | ICD-10-CM | POA: Diagnosis not present

## 2020-09-18 ENCOUNTER — Ambulatory Visit (INDEPENDENT_AMBULATORY_CARE_PROVIDER_SITE_OTHER): Payer: PPO | Admitting: Family Medicine

## 2020-09-18 ENCOUNTER — Encounter (INDEPENDENT_AMBULATORY_CARE_PROVIDER_SITE_OTHER): Payer: Self-pay | Admitting: Family Medicine

## 2020-09-18 ENCOUNTER — Other Ambulatory Visit (INDEPENDENT_AMBULATORY_CARE_PROVIDER_SITE_OTHER): Payer: Self-pay | Admitting: Family Medicine

## 2020-09-18 ENCOUNTER — Ambulatory Visit
Admission: RE | Admit: 2020-09-18 | Discharge: 2020-09-18 | Disposition: A | Discharge status: Home / Self Care | Payer: PPO | Source: Ambulatory Visit | Attending: Radiation Oncology | Admitting: Radiation Oncology

## 2020-09-18 ENCOUNTER — Other Ambulatory Visit: Payer: Self-pay

## 2020-09-18 VITALS — BP 117/75 | HR 92 | Temp 98.0°F | Ht 62.0 in | Wt 167.0 lb

## 2020-09-18 DIAGNOSIS — R7303 Prediabetes: Secondary | ICD-10-CM

## 2020-09-18 DIAGNOSIS — D0511 Intraductal carcinoma in situ of right breast: Secondary | ICD-10-CM | POA: Diagnosis not present

## 2020-09-18 DIAGNOSIS — E669 Obesity, unspecified: Secondary | ICD-10-CM

## 2020-09-18 DIAGNOSIS — R7301 Impaired fasting glucose: Secondary | ICD-10-CM | POA: Diagnosis not present

## 2020-09-18 DIAGNOSIS — Z6832 Body mass index (BMI) 32.0-32.9, adult: Secondary | ICD-10-CM

## 2020-09-18 DIAGNOSIS — E559 Vitamin D deficiency, unspecified: Secondary | ICD-10-CM | POA: Diagnosis not present

## 2020-09-18 NOTE — Telephone Encounter (Signed)
Last OV with Dr Wallace 

## 2020-09-18 NOTE — Progress Notes (Signed)
Chief Complaint:   OBESITY Kimberly Klein is here to discuss her progress with her obesity treatment plan along with follow-up of her obesity related diagnoses. Kimberly Klein is on the Category 2 Plan and keeping a food journal and adhering to recommended goals of 1200 calories and 75 grams of protein and states she is following her eating plan approximately 98% of the time. Kimberly Klein states she is walking for 40 minutes 4-5 times per week.  Today's visit was #: 5 Starting weight: 179 lbs Starting date: 06/28/2020 Today's weight: 167 lbs Today's date:09/18/2020 Total lbs lost to date: 12 lbs Total lbs lost since last in-office visit: 4 lbs  Interim History: Kimberly Klein is averaging only 40 grams of protein per day. She notes hunger is well controlled. She is under-going radiation for DCIS.  Subjective:   1. Prediabetes/IFG Kimberly Klein is on Ozempic 0.25 mg weekly. She is having some nausea. She has no appetite and struggles to meet protein goal.   Lab Results  Component Value Date   INSULIN 9.1 06/28/2020   Lab Results  Component Value Date   HGBA1C 5.7 (H) 06/28/2020   2. Vit D Deficiency Vit D low at 17.5. On weekly RX vit D. Assessment/Plan:   1. Prediabetes/IFG Kimberly Klein will continue at 0.25 mg weekly. She will continue to work on getting in adequate protein.   2. Vit D Deficiency Continue weekly RX vit D.  Obesity, current BMI 30.54 Kimberly Klein is currently in the action stage of change. As such, her goal is to continue with weight loss efforts. She has agreed to keeping a food journal and adhering to recommended goals of 1200 calories and 75 grams of protein.   We discussed ways to increase protein and importance of protein for healing. Discussed protein rich foods she can add to her plan.   Exercise goals:  As is.  Behavioral modification strategies: increasing lean protein intake.  Kimberly Klein has agreed to follow-up with our clinic in 4-5 weeks Kimberly Klein requests 4 week follow up.    Objective:   Blood pressure 117/75, pulse 92, temperature 98 F (36.7 C), height '5\' 2"'$  (1.575 m), weight 167 lb (75.8 kg), SpO2 95 %. Body mass index is 30.54 kg/m.  General: Cooperative, alert, well developed, in no acute distress. HEENT: Conjunctivae and lids unremarkable. Cardiovascular: Regular rhythm.  Lungs: Normal work of breathing. Neurologic: No focal deficits.   Lab Results  Component Value Date   CREATININE 0.92 06/28/2020   BUN 11 06/28/2020   NA 139 06/28/2020   K 4.8 06/28/2020   CL 100 06/28/2020   CO2 24 06/28/2020   Lab Results  Component Value Date   ALT 17 06/28/2020   AST 17 06/28/2020   ALKPHOS 99 06/28/2020   BILITOT 0.5 06/28/2020   Lab Results  Component Value Date   HGBA1C 5.7 (H) 06/28/2020   HGBA1C 5.3 09/27/2018   HGBA1C 4.9 05/30/2016   Lab Results  Component Value Date   INSULIN 9.1 06/28/2020   Lab Results  Component Value Date   TSH 0.998 06/28/2020   Lab Results  Component Value Date   CHOL 294 (H) 06/28/2020   HDL 83 06/28/2020   LDLCALC 193 (H) 06/28/2020   TRIG 105 06/28/2020   CHOLHDL 3.5 06/28/2020   Lab Results  Component Value Date   VD25OH 17.5 (L) 06/28/2020   Lab Results  Component Value Date   WBC 5.5 06/28/2020   HGB 13.9 06/28/2020   HCT 41.8 06/28/2020   MCV 95  06/28/2020   PLT 204 06/28/2020   Lab Results  Component Value Date   IRON 105 06/28/2020   TIBC 312 06/28/2020   FERRITIN 244 (H) 06/28/2020    Obesity Behavioral Intervention:   Approximately 15 minutes were spent on the discussion below.  ASK: We discussed the diagnosis of obesity with Kimberly Klein today and Kimberly Klein agreed to give [place] permission to discuss obesity behavioral modification therapy today.  ASSESS: Kimberly Klein has the diagnosis of obesity and her BMI today is 30.7. Kimberly Klein is in the action stage of change.   ADVISE: Kimberly Klein was educated on the multiple health risks of obesity as well as the benefit of weight loss to improve  her health. She was advised of the need for long term treatment and the importance of lifestyle modifications to improve her current health and to decrease her risk of future health problems.  AGREE: Multiple dietary modification options and treatment options were discussed and Kimberly Klein agreed to follow the recommendations documented in the above note.  ARRANGE: Kimberly Klein was educated on the importance of frequent visits to treat obesity as outlined per CMS and USPSTF guidelines and agreed to schedule her next follow up appointment today.  Attestation Statements:   Reviewed by clinician on day of visit: allergies, medications, problem list, medical history, surgical history, family history, social history, and previous encounter notes.  I, Kimberly Klein, RMA, am acting as Location manager for [employer], [place].   I have reviewed the above documentation for accuracy and completeness, and I agree with the above. -  Kimberly Fick, FNP

## 2020-09-19 ENCOUNTER — Encounter (INDEPENDENT_AMBULATORY_CARE_PROVIDER_SITE_OTHER): Payer: Self-pay | Admitting: Family Medicine

## 2020-09-19 ENCOUNTER — Other Ambulatory Visit (INDEPENDENT_AMBULATORY_CARE_PROVIDER_SITE_OTHER): Payer: Self-pay | Admitting: Family Medicine

## 2020-09-19 ENCOUNTER — Ambulatory Visit
Admission: RE | Admit: 2020-09-19 | Discharge: 2020-09-19 | Disposition: A | Discharge status: Home / Self Care | Payer: PPO | Source: Ambulatory Visit | Attending: Radiation Oncology | Admitting: Radiation Oncology

## 2020-09-19 DIAGNOSIS — R7301 Impaired fasting glucose: Secondary | ICD-10-CM

## 2020-09-19 DIAGNOSIS — D0511 Intraductal carcinoma in situ of right breast: Secondary | ICD-10-CM | POA: Diagnosis not present

## 2020-09-19 MED ORDER — OZEMPIC (0.25 OR 0.5 MG/DOSE) 2 MG/1.5ML ~~LOC~~ SOPN: 0.5000 mg | PEN_INJECTOR | SUBCUTANEOUS | 0 refills | Status: DC

## 2020-09-19 NOTE — Telephone Encounter (Signed)
Kimberly Klein 

## 2020-09-19 NOTE — Telephone Encounter (Signed)
Pt last seen by Dawn Whitmire, FNP.  

## 2020-09-19 NOTE — Telephone Encounter (Signed)
Please review and advise.

## 2020-09-19 NOTE — Telephone Encounter (Signed)
LAST APPOINTMENT DATE: 09/18/2020 NEXT APPOINTMENT DATE: 10/23/2020   [facility] [id number] [place] [address] [place] [address] Phone: [phone] Fax: [phone]  Patient is requesting a refill of the following medications: Requested Prescriptions   Pending Prescriptions Disp Refills   Semaglutide,0.25 or 0.'5MG'$ /DOS, (OZEMPIC, 0.25 OR 0.5 MG/DOSE,) 2 MG/1.5ML SOPN 1.5 mL 0    Sig: Inject 0.5 mg into the skin once a week.    Date last filled: 08/07/2020 Previously prescribed by Juleen China  Lab Results  Component Value Date   HGBA1C 5.7 (H) 06/28/2020   HGBA1C 5.3 09/27/2018   HGBA1C 4.9 05/30/2016   Lab Results  Component Value Date   MICROALBUR 10 04/04/2019   LDLCALC 193 (H) 06/28/2020   CREATININE 0.92 06/28/2020   Lab Results  Component Value Date   VD25OH 17.5 (L) 06/28/2020    BP Readings from Last 3 Encounters:  09/18/20 117/75  08/08/20 108/68  08/07/20 116/73

## 2020-09-20 ENCOUNTER — Ambulatory Visit
Admission: RE | Admit: 2020-09-20 | Discharge: 2020-09-20 | Disposition: A | Discharge status: Home / Self Care | Payer: PPO | Source: Ambulatory Visit | Attending: Radiation Oncology | Admitting: Radiation Oncology

## 2020-09-20 ENCOUNTER — Other Ambulatory Visit: Payer: Self-pay

## 2020-09-20 DIAGNOSIS — D0511 Intraductal carcinoma in situ of right breast: Secondary | ICD-10-CM | POA: Diagnosis not present

## 2020-09-21 ENCOUNTER — Ambulatory Visit: Payer: PPO | Admitting: Radiation Oncology

## 2020-09-21 ENCOUNTER — Ambulatory Visit
Admission: RE | Admit: 2020-09-21 | Discharge: 2020-09-21 | Disposition: A | Discharge status: Home / Self Care | Payer: PPO | Source: Ambulatory Visit | Attending: Radiation Oncology | Admitting: Radiation Oncology

## 2020-09-21 DIAGNOSIS — D0511 Intraductal carcinoma in situ of right breast: Secondary | ICD-10-CM | POA: Diagnosis not present

## 2020-09-24 ENCOUNTER — Ambulatory Visit
Admission: RE | Admit: 2020-09-24 | Discharge: 2020-09-24 | Disposition: A | Discharge status: Home / Self Care | Payer: PPO | Source: Ambulatory Visit | Attending: Radiation Oncology | Admitting: Radiation Oncology

## 2020-09-24 ENCOUNTER — Other Ambulatory Visit: Payer: Self-pay

## 2020-09-24 DIAGNOSIS — D0511 Intraductal carcinoma in situ of right breast: Secondary | ICD-10-CM | POA: Diagnosis not present

## 2020-09-25 ENCOUNTER — Ambulatory Visit: Payer: PPO | Admitting: Radiation Oncology

## 2020-09-25 ENCOUNTER — Other Ambulatory Visit: Payer: Self-pay

## 2020-09-25 ENCOUNTER — Ambulatory Visit
Admission: RE | Admit: 2020-09-25 | Discharge: 2020-09-25 | Disposition: A | Discharge status: Home / Self Care | Payer: PPO | Source: Ambulatory Visit | Attending: Radiation Oncology | Admitting: Radiation Oncology

## 2020-09-25 DIAGNOSIS — D0511 Intraductal carcinoma in situ of right breast: Secondary | ICD-10-CM | POA: Diagnosis not present

## 2020-09-26 ENCOUNTER — Ambulatory Visit: Payer: PPO

## 2020-09-26 ENCOUNTER — Ambulatory Visit
Admission: RE | Admit: 2020-09-26 | Discharge: 2020-09-26 | Disposition: A | Discharge status: Home / Self Care | Payer: PPO | Source: Ambulatory Visit | Attending: Radiation Oncology | Admitting: Radiation Oncology

## 2020-09-26 DIAGNOSIS — D0511 Intraductal carcinoma in situ of right breast: Secondary | ICD-10-CM | POA: Diagnosis not present

## 2020-09-27 ENCOUNTER — Ambulatory Visit
Admission: RE | Admit: 2020-09-27 | Discharge: 2020-09-27 | Disposition: A | Discharge status: Home / Self Care | Payer: PPO | Source: Ambulatory Visit | Attending: Radiation Oncology | Admitting: Radiation Oncology

## 2020-09-27 ENCOUNTER — Ambulatory Visit: Payer: PPO

## 2020-09-27 DIAGNOSIS — D0511 Intraductal carcinoma in situ of right breast: Secondary | ICD-10-CM | POA: Diagnosis not present

## 2020-09-28 ENCOUNTER — Other Ambulatory Visit: Payer: Self-pay

## 2020-09-28 ENCOUNTER — Ambulatory Visit: Payer: PPO

## 2020-09-28 ENCOUNTER — Ambulatory Visit
Admission: RE | Admit: 2020-09-28 | Discharge: 2020-09-28 | Disposition: A | Discharge status: Home / Self Care | Payer: PPO | Source: Ambulatory Visit | Attending: Radiation Oncology | Admitting: Radiation Oncology

## 2020-09-28 ENCOUNTER — Telehealth: Payer: Self-pay | Admitting: Oncology

## 2020-09-28 DIAGNOSIS — D0511 Intraductal carcinoma in situ of right breast: Secondary | ICD-10-CM

## 2020-09-28 NOTE — Telephone Encounter (Signed)
Sch per 9/16 in basket, left msg

## 2020-10-01 ENCOUNTER — Ambulatory Visit: Payer: PPO

## 2020-10-01 ENCOUNTER — Inpatient Hospital Stay: Payer: PPO | Admitting: Oncology

## 2020-10-01 ENCOUNTER — Other Ambulatory Visit: Payer: Self-pay

## 2020-10-01 ENCOUNTER — Ambulatory Visit
Admission: RE | Admit: 2020-10-01 | Discharge: 2020-10-01 | Disposition: A | Discharge status: Home / Self Care | Payer: PPO | Source: Ambulatory Visit | Attending: Radiation Oncology | Admitting: Radiation Oncology

## 2020-10-01 DIAGNOSIS — D0511 Intraductal carcinoma in situ of right breast: Secondary | ICD-10-CM | POA: Diagnosis not present

## 2020-10-02 ENCOUNTER — Ambulatory Visit
Admission: RE | Admit: 2020-10-02 | Discharge: 2020-10-02 | Disposition: A | Discharge status: Home / Self Care | Payer: PPO | Source: Ambulatory Visit | Attending: Radiation Oncology | Admitting: Radiation Oncology

## 2020-10-02 ENCOUNTER — Other Ambulatory Visit: Payer: Self-pay

## 2020-10-02 DIAGNOSIS — D0511 Intraductal carcinoma in situ of right breast: Secondary | ICD-10-CM | POA: Diagnosis not present

## 2020-10-03 ENCOUNTER — Encounter: Payer: Self-pay | Admitting: *Deleted

## 2020-10-03 ENCOUNTER — Ambulatory Visit
Admission: RE | Admit: 2020-10-03 | Discharge: 2020-10-03 | Disposition: A | Discharge status: Home / Self Care | Payer: PPO | Source: Ambulatory Visit | Attending: Radiation Oncology | Admitting: Radiation Oncology

## 2020-10-03 DIAGNOSIS — D0511 Intraductal carcinoma in situ of right breast: Secondary | ICD-10-CM | POA: Diagnosis not present

## 2020-10-04 ENCOUNTER — Encounter: Payer: Self-pay | Admitting: Radiation Oncology

## 2020-10-04 ENCOUNTER — Ambulatory Visit
Admission: RE | Admit: 2020-10-04 | Discharge: 2020-10-04 | Disposition: A | Discharge status: Home / Self Care | Payer: PPO | Source: Ambulatory Visit | Attending: Radiation Oncology | Admitting: Radiation Oncology

## 2020-10-04 ENCOUNTER — Other Ambulatory Visit: Payer: Self-pay

## 2020-10-04 DIAGNOSIS — D0511 Intraductal carcinoma in situ of right breast: Secondary | ICD-10-CM | POA: Diagnosis not present

## 2020-10-05 ENCOUNTER — Encounter (INDEPENDENT_AMBULATORY_CARE_PROVIDER_SITE_OTHER): Payer: Self-pay | Admitting: Family Medicine

## 2020-10-08 ENCOUNTER — Ambulatory Visit: Payer: PPO | Admitting: Family Medicine

## 2020-10-15 ENCOUNTER — Ambulatory Visit: Payer: PPO | Admitting: Family Medicine

## 2020-10-16 ENCOUNTER — Other Ambulatory Visit (INDEPENDENT_AMBULATORY_CARE_PROVIDER_SITE_OTHER): Payer: Self-pay | Admitting: Family Medicine

## 2020-10-16 ENCOUNTER — Ambulatory Visit (INDEPENDENT_AMBULATORY_CARE_PROVIDER_SITE_OTHER): Payer: PPO | Admitting: Family Medicine

## 2020-10-16 DIAGNOSIS — R7301 Impaired fasting glucose: Secondary | ICD-10-CM

## 2020-10-16 NOTE — Telephone Encounter (Signed)
My chart message sent to pt.

## 2020-10-18 ENCOUNTER — Telehealth: Payer: Self-pay | Admitting: Adult Health

## 2020-10-18 NOTE — Telephone Encounter (Signed)
Called and [facility] for patient to return my call to discuss SCP visit.    My chart message sent.  Wilber Bihari, NP

## 2020-10-18 NOTE — Telephone Encounter (Signed)
Attempted to schedule per 10/6 sch msg, pt said she was out of town and will call to reschedule

## 2020-10-19 ENCOUNTER — Telehealth: Payer: Self-pay | Admitting: Adult Health

## 2020-10-19 NOTE — Telephone Encounter (Signed)
Scheduled per sch msg. Called, not able to leave msg. Mailed printout  °

## 2020-10-22 ENCOUNTER — Encounter (INDEPENDENT_AMBULATORY_CARE_PROVIDER_SITE_OTHER): Payer: PPO | Admitting: Ophthalmology

## 2020-10-23 ENCOUNTER — Ambulatory Visit (INDEPENDENT_AMBULATORY_CARE_PROVIDER_SITE_OTHER): Payer: PPO | Admitting: Family Medicine

## 2020-10-23 ENCOUNTER — Other Ambulatory Visit: Payer: Self-pay

## 2020-10-23 ENCOUNTER — Encounter (INDEPENDENT_AMBULATORY_CARE_PROVIDER_SITE_OTHER): Payer: Self-pay | Admitting: Family Medicine

## 2020-10-23 VITALS — BP 146/83 | HR 73 | Temp 97.5°F | Ht 62.0 in | Wt 168.0 lb

## 2020-10-23 DIAGNOSIS — E559 Vitamin D deficiency, unspecified: Secondary | ICD-10-CM | POA: Diagnosis not present

## 2020-10-23 DIAGNOSIS — Z6832 Body mass index (BMI) 32.0-32.9, adult: Secondary | ICD-10-CM

## 2020-10-23 DIAGNOSIS — E669 Obesity, unspecified: Secondary | ICD-10-CM | POA: Diagnosis not present

## 2020-10-23 DIAGNOSIS — R7303 Prediabetes: Secondary | ICD-10-CM

## 2020-10-23 DIAGNOSIS — E7800 Pure hypercholesterolemia, unspecified: Secondary | ICD-10-CM

## 2020-10-24 NOTE — Progress Notes (Signed)
Chief Complaint:   OBESITY Kimberly Klein is here to discuss her progress with her obesity treatment plan along with follow-up of her obesity related diagnoses.   Today's visit was #: 6 Starting weight: 179 lbs Starting date: 06/28/2020 Today's weight: 168 lbs Today's date: 10/23/2020 Weight change since last visit: +1 lb Total lbs lost to date: 11 lbs Body mass index is 30.73 kg/m.  Total weight loss percentage to date: -6.15%  Current Meal Plan: keeping a food journal and adhering to recommended goals of 1200 calories and 75 grams of protein for 75% of the time.  Current Exercise Plan: Walking for 40 minutes 5 times per week. Current Anti-Obesity Medications: Ozempic 0.5 mg subcutaneously weekly. Side effects: None.  Interim History:  [name] increased Ozempic to 0.5 mg and had some constipation.  She says she is finished with radiation.  She is eating more meat and using more protein powder/drinks.  She says she was on vacation last week and had 1 meal per day.  No alcohol.  Assessment/Plan:   1. Prediabetes, with polyphagia At goal. Goal is HgbA1c < 5.7.  Medication: Ozempic 0.5 mg subcutaneously weekly.    Plan:  Continue Ozempic 0.5 mg subcutaneously weekly.  She will continue to focus on protein-rich, low simple carbohydrate foods. We reviewed the importance of hydration, regular exercise for stress reduction, and restorative sleep.   Lab Results  Component Value Date   HGBA1C 5.7 (H) 06/28/2020   Lab Results  Component Value Date   INSULIN 9.1 06/28/2020   2. Vitamin D deficiency She has completed her course of vitamin D.  Lab Results  Component Value Date   VD25OH 17.5 (L) 06/28/2020   3. Pure hypercholesterolemia Course: Not at goal. Lipid-lowering medications: None.   Plan: Dietary changes: Increase soluble fiber, decrease simple carbohydrates, decrease saturated fat. Exercise changes: Moderate to vigorous-intensity aerobic activity 150 minutes per week or as  tolerated. We will continue to monitor along with PCP/specialists as it pertains to her weight loss journey.  Will check labs at next visit.  Lab Results  Component Value Date   CHOL 294 (H) 06/28/2020   HDL 83 06/28/2020   LDLCALC 193 (H) 06/28/2020   TRIG 105 06/28/2020   CHOLHDL 3.5 06/28/2020   Lab Results  Component Value Date   ALT 17 06/28/2020   AST 17 06/28/2020   ALKPHOS 99 06/28/2020   BILITOT 0.5 06/28/2020   The 10-year ASCVD risk score (Arnett DK, et al., 2019) is: 20.4%   Values used to calculate the score:     Age: 49 years     Sex: Female     Is Non-Hispanic African American: No     Diabetic: No     Tobacco smoker: No     Systolic Blood Pressure: 093 mmHg     Is BP treated: Yes     HDL Cholesterol: 83 mg/dL     Total Cholesterol: 294 mg/dL  4. Obesity, current BMI 30.7  Course: [name] is currently in the action stage of change. As such, her goal is to continue with weight loss efforts.   Nutrition goals: She has agreed to keeping a food journal and adhering to recommended goals of 1200 calories and 75 grams of protein.   Exercise goals:  As is.  Behavioral modification strategies: increasing lean protein intake, decreasing simple carbohydrates, increasing vegetables, and increasing water intake.  [name] has agreed to follow-up with our clinic in 3 weeks. She was informed of the  importance of frequent follow-up visits to maximize her success with intensive lifestyle modifications for her multiple health conditions.   Objective:   Blood pressure (!) 146/83, pulse 73, temperature (!) 97.5 F (36.4 C), temperature source Oral, height 5\' 2"  (1.575 m), weight 168 lb (76.2 kg), SpO2 98 %. Body mass index is 30.73 kg/m.  General: Cooperative, alert, well developed, in no acute distress. HEENT: Conjunctivae and lids unremarkable. Cardiovascular: Regular rhythm.  Lungs: Normal work of breathing. Neurologic: No focal deficits.   Lab Results  Component  Value Date   CREATININE 0.92 06/28/2020   BUN 11 06/28/2020   NA 139 06/28/2020   K 4.8 06/28/2020   CL 100 06/28/2020   CO2 24 06/28/2020   Lab Results  Component Value Date   ALT 17 06/28/2020   AST 17 06/28/2020   ALKPHOS 99 06/28/2020   BILITOT 0.5 06/28/2020   Lab Results  Component Value Date   HGBA1C 5.7 (H) 06/28/2020   HGBA1C 5.3 09/27/2018   HGBA1C 4.9 05/30/2016   Lab Results  Component Value Date   INSULIN 9.1 06/28/2020   Lab Results  Component Value Date   TSH 0.998 06/28/2020   Lab Results  Component Value Date   CHOL 294 (H) 06/28/2020   HDL 83 06/28/2020   LDLCALC 193 (H) 06/28/2020   TRIG 105 06/28/2020   CHOLHDL 3.5 06/28/2020   Lab Results  Component Value Date   VD25OH 17.5 (L) 06/28/2020   Lab Results  Component Value Date   WBC 5.5 06/28/2020   HGB 13.9 06/28/2020   HCT 41.8 06/28/2020   MCV 95 06/28/2020   PLT 204 06/28/2020   Lab Results  Component Value Date   IRON 105 06/28/2020   TIBC 312 06/28/2020   FERRITIN 244 (H) 06/28/2020   Attestation Statements:   Reviewed by clinician on day of visit: allergies, medications, problem list, medical history, surgical history, family history, social history, and previous encounter notes.  Time spent on visit including pre-visit chart review and post-visit care and charting was 35 minutes.   I, Water quality scientist, CMA, am acting as transcriptionist for Briscoe Deutscher, DO  I have reviewed the above documentation for accuracy and completeness, and I agree with the above. -  Briscoe Deutscher, DO, MS, FAAFP, DABOM - Family and Bariatric Medicine.

## 2020-10-29 ENCOUNTER — Encounter (INDEPENDENT_AMBULATORY_CARE_PROVIDER_SITE_OTHER): Payer: PPO | Admitting: Ophthalmology

## 2020-10-29 ENCOUNTER — Other Ambulatory Visit: Payer: Self-pay

## 2020-10-29 ENCOUNTER — Encounter: Payer: Self-pay | Admitting: Radiation Oncology

## 2020-10-29 DIAGNOSIS — H43813 Vitreous degeneration, bilateral: Secondary | ICD-10-CM | POA: Diagnosis not present

## 2020-10-29 DIAGNOSIS — H353132 Nonexudative age-related macular degeneration, bilateral, intermediate dry stage: Secondary | ICD-10-CM | POA: Diagnosis not present

## 2020-11-02 NOTE — Progress Notes (Incomplete)
  Radiation Oncology         [phone] ________________________________  Patient Name: Kimberly Klein MRN: [id number] DOB: 02-Jan-1951 Referring Physician: Park Liter (Profile Not Attached) Date of Service: 10/04/2020 [facility], [place]  End Of Treatment Note  Diagnoses: D05.11-Intraductal carcinoma in situ of right breast  Cancer Staging: Stage 0 Right Breast LOQ, DCIS, ER+ / PR- / Grade 2 or 3  Intent: Curative  Radiation Treatment Dates: 08/20/2020 through 10/04/2020 Site Technique Total Dose (Gy) Dose per Fx (Gy) Completed Fx Beam Energies  Breast, Right: Breast_Rt 3D 50.4/50.4 1.8 28/28 6X, 10X  Breast, Right: Breast_Rt_Bst 3D 10/10 2 5/5 6X, 10X   Narrative: The patient tolerated radiation therapy relatively well. She denies pain, fatigue, breast swelling or tenderness. Demonstrates full range of motion. Patient reports skin irritation is improving with use of Strata and is using Radiaplex as directed.  On physical exam, the right breast area shows significant hyperpigmentation changes but no skin breakdown   Plan: The patient will follow-up with radiation oncology in one month .  ________________________________________________ -----------------------------------  Blair Promise, PhD, MD  This document serves as a record of services personally performed by Gery Pray, MD. It was created on his behalf by Roney Mans, a trained medical scribe. The creation of this record is based on the scribe's personal observations and the provider's statements to them. This document has been checked and approved by the attending provider.

## 2020-11-03 NOTE — Progress Notes (Signed)
Radiation Oncology         [phone] ________________________________  Name: Kimberly Klein MRN: [id number]  Date: 11/05/2020  DOB: 1950-12-07  Follow-Up Visit Note  CC: Valerie Roys, DO  Johnson, Megan P, DO    ICD-10-CM   1. Ductal carcinoma in situ (DCIS) of right breast  D05.11       Diagnosis: Stage 0 Right Breast LOQ, DCIS, ER+ / PR- / Grade 2 or 3   Interval Since Last Radiation: 1 month and 2 days   Intent: Curative  Radiation Treatment Dates: 08/20/2020 through 10/04/2020 Site Technique Total Dose (Gy) Dose per Fx (Gy) Completed Fx Beam Energies  Breast, Right: Breast_Rt 3D 50.4/50.4 1.8 28/28 6X, 10X  Breast, Right: Breast_Rt_Bst 3D 10/10 2 5/5 6X, 10X    Narrative:  The patient returns today for routine follow-up. She tolerated her recent radiation treatment well with the exception of significant hyperpigmentation changes to the right breast.       Otherwise, no significant interval history since the patient was seen for consultation.    On evaluation today the patient denies any pain in the right breast area nipple discharge or bleeding.  She denies any itching or discomfort within the breast or fatigue at this point.                       Allergies:  has No Known Allergies.  Meds: Current Outpatient Medications  Medication Sig Dispense Refill   amLODipine (NORVASC) 5 MG tablet TAKE 1 TABLET(5 MG) BY MOUTH DAILY (Patient taking differently: Take 5 mg by mouth daily. TAKE 1 TABLET(5 MG) BY MOUTH DAILY) 90 tablet 1   lisinopril (ZESTRIL) 20 MG tablet Take 20 mg by mouth daily.     Multiple Vitamins-Minerals (PRESERVISION AREDS 2+MULTI VIT PO) Take 1 capsule by mouth in the morning and at bedtime.     Semaglutide,0.25 or 0.5MG /DOS, (OZEMPIC, 0.25 OR 0.5 MG/DOSE,) 2 MG/1.5ML SOPN Inject 0.5 mg into the skin once a week. 1.5 mL 0   valACYclovir (VALTREX) 1000 MG tablet Take 1 tablet (1,000 mg total) by mouth 2 (two) times daily. (Patient taking differently:  Take 1,000 mg by mouth 2 (two) times daily as needed (outbreaks).) 20 tablet 6   ondansetron (ZOFRAN) 4 MG tablet Take 1 tablet (4 mg total) by mouth every 8 (eight) hours as needed for nausea or vomiting. (Patient not taking: Reported on 11/05/2020) 20 tablet 0   No current facility-administered medications for this encounter.    Physical Findings: The patient is in no acute distress. Patient is alert and oriented.  height is 5\' 2"  (1.575 m) and weight is 170 lb 6.4 oz (77.3 kg). Her temperature is 98 F (36.7 C). Her blood pressure is 110/80 and her pulse is 97. Her respiration is 20 and oxygen saturation is 98%. .  No significant changes. Lungs are clear to auscultation bilaterally. Heart has regular rate and rhythm. No palpable cervical, supraclavicular, or axillary adenopathy.  Right Breast:   Mild edema in the nipple areolar complex area.  Hyperpigmentation changes noted throughout the right breast area.  Skin is healed well.  No dominant mass appreciated in the breast nipple discharge or bleeding.    Lab Findings: Lab Results  Component Value Date   WBC 5.5 06/28/2020   HGB 13.9 06/28/2020   HCT 41.8 06/28/2020   MCV 95 06/28/2020   PLT 204 06/28/2020    Radiographic Findings: No results found.  Impression:  Stage 0 Right Breast LOQ, DCIS, ER+ / PR- / Grade 2 or 3   The patient has recovered well from her radiation therapy.  No evidence of recurrence on clinical exam today.  Plan: As needed follow-up in radiation oncology.  The patient will meet with Dr. Jana Hakim next month to discuss adjuvant hormonal therapy.    ____________________________________  Blair Promise, PhD, MD   This document serves as a record of services personally performed by Gery Pray, MD. It was created on his behalf by Roney Mans, a trained medical scribe. The creation of this record is based on the scribe's personal observations and the provider's statements to them. This document has been  checked and approved by the attending provider.

## 2020-11-05 ENCOUNTER — Encounter: Payer: Self-pay | Admitting: Radiation Oncology

## 2020-11-05 ENCOUNTER — Other Ambulatory Visit: Payer: Self-pay

## 2020-11-05 ENCOUNTER — Ambulatory Visit
Admission: RE | Admit: 2020-11-05 | Discharge: 2020-11-05 | Disposition: A | Discharge status: Home / Self Care | Payer: PPO | Source: Ambulatory Visit | Attending: Radiation Oncology | Admitting: Radiation Oncology

## 2020-11-05 VITALS — BP 110/80 | HR 97 | Temp 98.0°F | Resp 20 | Ht 62.0 in | Wt 170.4 lb

## 2020-11-05 DIAGNOSIS — Z170 Estrogen receptor positive status [ER+]: Secondary | ICD-10-CM | POA: Insufficient documentation

## 2020-11-05 DIAGNOSIS — Z79899 Other long term (current) drug therapy: Secondary | ICD-10-CM | POA: Insufficient documentation

## 2020-11-05 DIAGNOSIS — Z923 Personal history of irradiation: Secondary | ICD-10-CM | POA: Diagnosis not present

## 2020-11-05 DIAGNOSIS — D0511 Intraductal carcinoma in situ of right breast: Secondary | ICD-10-CM | POA: Insufficient documentation

## 2020-11-05 HISTORY — DX: Personal history of irradiation: Z92.3

## 2020-11-05 NOTE — Progress Notes (Signed)
Kimberly Klein is here today for follow up post radiation to the breast.   Breast Side: right   They completed their radiation on: 10/04/2020   Does the patient complain of any of the following: Post radiation skin issues: denies Breast Tenderness: denies Breast Swelling: denies Lymphadema: denies Range of Motion limitations: full range of motion Fatigue post radiation: denies Appetite good/fair/poor: good  Additional comments if applicable: none  Vitals:   11/05/20 1517  BP: 110/80  Pulse: 97  Resp: 20  Temp: 98 F (36.7 C)  SpO2: 98%  Weight: 170 lb 6.4 oz (77.3 kg)  Height: 5\' 2"  (1.575 m)

## 2020-11-06 ENCOUNTER — Other Ambulatory Visit: Payer: Self-pay

## 2020-11-06 ENCOUNTER — Ambulatory Visit: Payer: PPO | Attending: Internal Medicine

## 2020-11-06 DIAGNOSIS — Z23 Encounter for immunization: Secondary | ICD-10-CM

## 2020-11-06 MED ORDER — PFIZER COVID-19 VAC BIVALENT 30 MCG/0.3ML IM SUSP
INTRAMUSCULAR | 0 refills | Status: DC
  Filled 2020-11-06: qty 0.3, 1d supply, fill #0

## 2020-11-06 NOTE — Progress Notes (Addendum)
   Covid-19 Vaccination Clinic  Name:  JANAISA BIRKLAND    MRN: [id number] DOB: Sep 28, 1950  11/06/2020  Ms. Egler was not observed post Covid-19 immunization for 15 minutes. 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 She was provided with Vaccine Information Sheet and instruction to access the V-Safe system.   Ms. Mcmasters was instructed to call 911 with any severe reactions post vaccine: Difficulty breathing  Swelling of face and throat  A fast heartbeat  A bad rash all over body  Dizziness and weakness   Immunizations Administered     Name Date Dose VIS Date Route   Pfizer Covid-19 Vaccine Bivalent Booster 11/06/2020 11:42 AM 0.3 mL 09/12/2020 Intramuscular   Manufacturer: [place]   Lot: [place]: [place], PharmD, MBA Clinical Acute Care Pharmacist

## 2020-11-13 ENCOUNTER — Other Ambulatory Visit: Payer: Self-pay

## 2020-11-13 ENCOUNTER — Encounter (INDEPENDENT_AMBULATORY_CARE_PROVIDER_SITE_OTHER): Payer: Self-pay | Admitting: Family Medicine

## 2020-11-13 ENCOUNTER — Ambulatory Visit (INDEPENDENT_AMBULATORY_CARE_PROVIDER_SITE_OTHER): Payer: PPO | Admitting: Family Medicine

## 2020-11-13 VITALS — BP 109/69 | HR 71 | Temp 97.7°F | Ht 62.0 in | Wt 164.0 lb

## 2020-11-13 DIAGNOSIS — R7301 Impaired fasting glucose: Secondary | ICD-10-CM | POA: Diagnosis not present

## 2020-11-13 DIAGNOSIS — E669 Obesity, unspecified: Secondary | ICD-10-CM | POA: Diagnosis not present

## 2020-11-13 DIAGNOSIS — Z6832 Body mass index (BMI) 32.0-32.9, adult: Secondary | ICD-10-CM | POA: Diagnosis not present

## 2020-11-13 DIAGNOSIS — E559 Vitamin D deficiency, unspecified: Secondary | ICD-10-CM | POA: Diagnosis not present

## 2020-11-13 MED ORDER — VITAMIN D (ERGOCALCIFEROL) 1.25 MG (50000 UNIT) PO CAPS: 50000.0000 [IU] | ORAL_CAPSULE | ORAL | 0 refills | Status: DC

## 2020-11-13 MED ORDER — OZEMPIC (0.25 OR 0.5 MG/DOSE) 2 MG/1.5ML ~~LOC~~ SOPN
0.5000 mg | PEN_INJECTOR | SUBCUTANEOUS | 0 refills | Status: DC
Start: 2020-11-13 — End: 2020-12-11

## 2020-11-13 NOTE — Progress Notes (Signed)
Chief Complaint:   OBESITY Grisel is here to discuss her progress with her obesity treatment plan along with follow-up of her obesity related diagnoses. Shallyn is on keeping a food journal and adhering to recommended goals of 1200 calories and 75 grams of protein and states she is following her eating plan approximately 95% of the time. Aliviana states she is walking for 20 minutes 4-5 times per week.  Today's visit was #: 7 Starting weight: 179 lbs Starting date: 06/28/2020 Today's weight: 164 lbs Today's date: 11/13/2020 Total lbs lost to date: 15 lbs Total lbs lost since last in-office visit: 4 lbs  Interim History: Cystal is doing better with protein intake and has really been focusing on this. However, protein intake averages only 30-40 grams per day. She is journaling consistently. Her calories averages 1000-1200 daily.  Subjective:   1. Impaired fasting glucose/Pre-diabetes Dulcemaria states nausea has improved. She is on Ozempic 0.5 mg which seems to be helping with appetite.   Lab Results  Component Value Date   HGBA1C 5.7 (H) 06/28/2020   Lab Results  Component Value Date   INSULIN 9.1 06/28/2020    2. Vitamin D deficiency Braylin's Vitamin D is very low at 17.5. She has not been on Vitamin D supplementation. She wasn't aware she should keep taking it after her initial prescription.   Lab Results  Component Value Date   VD25OH 17.5 (L) 06/28/2020    Assessment/Plan:   1. Impaired fasting glucose/Pre-diabetes We will refill Ozempic 0.5 mg weekly.   - Semaglutide,0.25 or 0.5MG /DOS, (OZEMPIC, 0.25 OR 0.5 MG/DOSE,) 2 MG/1.5ML SOPN; Inject 0.5 mg into the skin once a week.  Dispense: 1.5 mL; Refill: 0  2. Vitamin D deficiency Shakima agrees to start  prescription Vitamin D 50,000 IU every week and Evgenia will follow-up for routine testing of Vitamin D, at least 2-3 times per year to avoid over-replacement. We will check labs on next office visit.   - Vitamin  D, Ergocalciferol, (DRISDOL) 1.25 MG (50000 UNIT) CAPS capsule; Take 1 capsule (50,000 Units total) by mouth every 7 (seven) days.  Dispense: 12 capsule; Refill: 0  3. Overweight, Current BMI 29.99 Dnyla is currently in the action stage of change. As such, her goal is to continue with weight loss efforts. She has agreed to keeping a food journal and adhering to recommended goals of 1100-1200 calories and 75 grams of protein daily.  Jaxson will work on increasing protein intake.  Exercise goals:  As is.   Behavioral modification strategies: increasing lean protein intake and meal planning and cooking strategies.  Cambree has agreed to follow-up with our clinic in 4 weeks.  Objective:   Blood pressure 109/69, pulse 71, temperature 97.7 F (36.5 C), height 5\' 2"  (1.575 m), weight 164 lb (74.4 kg), SpO2 97 %. Body mass index is 30 kg/m.  General: Cooperative, alert, well developed, in no acute distress. HEENT: Conjunctivae and lids unremarkable. Cardiovascular: Regular rhythm.  Lungs: Normal work of breathing. Neurologic: No focal deficits.   Lab Results  Component Value Date   CREATININE 0.92 06/28/2020   BUN 11 06/28/2020   NA 139 06/28/2020   K 4.8 06/28/2020   CL 100 06/28/2020   CO2 24 06/28/2020   Lab Results  Component Value Date   ALT 17 06/28/2020   AST 17 06/28/2020   ALKPHOS 99 06/28/2020   BILITOT 0.5 06/28/2020   Lab Results  Component Value Date   HGBA1C 5.7 (H) 06/28/2020  HGBA1C 5.3 09/27/2018   HGBA1C 4.9 05/30/2016   Lab Results  Component Value Date   INSULIN 9.1 06/28/2020   Lab Results  Component Value Date   TSH 0.998 06/28/2020   Lab Results  Component Value Date   CHOL 294 (H) 06/28/2020   HDL 83 06/28/2020   LDLCALC 193 (H) 06/28/2020   TRIG 105 06/28/2020   CHOLHDL 3.5 06/28/2020   Lab Results  Component Value Date   VD25OH 17.5 (L) 06/28/2020   Lab Results  Component Value Date   WBC 5.5 06/28/2020   HGB 13.9 06/28/2020    HCT 41.8 06/28/2020   MCV 95 06/28/2020   PLT 204 06/28/2020   Lab Results  Component Value Date   IRON 105 06/28/2020   TIBC 312 06/28/2020   FERRITIN 244 (H) 06/28/2020    Obesity Behavioral Intervention:   Approximately 15 minutes were spent on the discussion below.  ASK: We discussed the diagnosis of obesity with Haylin today and Kesleigh agreed to give [place] permission to discuss obesity behavioral modification therapy today.  ASSESS: Alyda has the diagnosis of obesity and her BMI today is 30.0. Danielys is in the action stage of change.   ADVISE: Daylah was educated on the multiple health risks of obesity as well as the benefit of weight loss to improve her health. She was advised of the need for long term treatment and the importance of lifestyle modifications to improve her current health and to decrease her risk of future health problems.  AGREE: Multiple dietary modification options and treatment options were discussed and Marisha agreed to follow the recommendations documented in the above note.  ARRANGE: Jennalynn was educated on the importance of frequent visits to treat obesity as outlined per CMS and USPSTF guidelines and agreed to schedule her next follow up appointment today.  Attestation Statements:   Reviewed by clinician on day of visit: allergies, medications, problem list, medical history, surgical history, family history, social history, and previous encounter notes.  I, Lizbeth Bark, RMA, am acting as Location manager for [employer], [place].   I have reviewed the above documentation for accuracy and completeness, and I agree with the above. -  Georgianne Fick, FNP

## 2020-11-27 ENCOUNTER — Ambulatory Visit: Payer: PPO | Admitting: Plastic Surgery

## 2020-11-27 ENCOUNTER — Other Ambulatory Visit: Payer: Self-pay

## 2020-11-27 DIAGNOSIS — N651 Disproportion of reconstructed breast: Secondary | ICD-10-CM

## 2020-11-27 DIAGNOSIS — D0511 Intraductal carcinoma in situ of right breast: Secondary | ICD-10-CM

## 2020-11-27 NOTE — Progress Notes (Signed)
   Subjective:    Patient ID: Kimberly Klein, female    DOB: 28-Jun-1950, 70 y.o.   MRN: [id number]  The patient is a 70 year old female here for follow-up on her breast surgery.  She had right breast cancer and opted for removal of her implants with bilateral mastopexy.  She has finished radiation.  She is doing well overall and is pleased with her results.  She has a little bit of asymmetry and I gave her the information for second to nature.  In time we may need to do some contracture release especially if the scar tissue tightens on the right side.     Review of Systems  Constitutional: Negative.   Eyes: Negative.   Respiratory: Negative.    Cardiovascular: Negative.   Gastrointestinal: Negative.   Endocrine: Negative.   Genitourinary: Negative.       Objective:   Physical Exam Vitals and nursing note reviewed.  Constitutional:      Appearance: Normal appearance.  HENT:     Head: Normocephalic and atraumatic.  Cardiovascular:     Rate and Rhythm: Normal rate.     Pulses: Normal pulses.  Pulmonary:     Effort: Pulmonary effort is normal. No respiratory distress.  Skin:    Capillary Refill: Capillary refill takes less than 2 seconds.  Neurological:     Mental Status: She is alert and oriented to person, place, and time.  Psychiatric:        Mood and Affect: Mood normal.        Behavior: Behavior normal.        Thought Content: Thought content normal.       Assessment & Plan:     ICD-10-CM   1. Ductal carcinoma in situ (DCIS) of right breast  D05.11     2. Breast asymmetry following reconstructive surgery  N65.1        Pictures were obtained of the patient and placed in the chart with the patient's or guardian's permission. Massage area and continue with sports bra.  Information for second to nature given.  I like to see her back in 6 months to 1 year.  If we need to do fat grafting that would be a good time to do it.

## 2020-11-28 NOTE — Progress Notes (Addendum)
[facility]  [phone] [phone]     ID: Kimberly Klein DOB: 09/05/1950  MR#: [id number]  MOQ#:[id number]  Patient Care Team: Valerie Roys, DO as PCP - General (Family Medicine) Mauro Kaufmann, RN as Oncology Nurse Navigator Rockwell Germany, RN as Oncology Nurse Navigator Xaden Kaufman, Virgie Dad, MD as Consulting Physician (Oncology) [name], MD as Consulting Physician (General Surgery) Gery Pray, MD as Consulting Physician (Radiation Oncology) Chauncey Cruel, MD OTHER MD:  I connected with Manus Rudd on 11/29/20 at  9:30 AM EST by video enabled telemedicine visit and verified that I am speaking with the correct person using two identifiers.   I discussed the limitations, risks, security and privacy concerns of performing an evaluation and management service by telemedicine and the availability of in-person appointments. I also discussed with the patient that there may be a patient responsible charge related to this service. The patient expressed understanding and agreed to proceed.   Other persons participating in the visit and their role in the encounter: None  Patient's location: Work Provider's location: [facility]   I provided 15 minutes of face-to-face video visit time during this encounter, and > 50% was spent counseling as documented under my assessment & plan.   CHIEF COMPLAINT: Ductal carcinoma in situ  CURRENT TREATMENT: To start anastrozole   INTERVAL HISTORY: Kimberly Klein was contacted today for follow up of her noninvasive breast cancer. She was evaluated in the multidisciplinary breast cancer clinic on 04/04/2020.  She proceeded to right lumpectomy on 05/02/2020 under Dr. Barry Dienes. Pathology from the procedure [id number]) showed: high-grade ductal carcinoma in situ with necrosis and calcifications, spanning a fibrotic area of 6.7 cm; posterior margin focally positive for DCIS, other margins focally  less than 1 mm.  She underwent re-excision of the positive margins and bilateral implant removal on 07/11/2020. Pathology [id number]) showed residual ductal carcinoma in situ, intermediate to high grade. All new margins negative for carcinoma.  She was referred back to Dr. Sondra Come on 08/08/2020 to review radiation therapy. She subsequently received treatment from 08/20/2020 through 10/04/2020.   REVIEW OF SYSTEMS: Kimberly Klein did very well with radiation, with mild skin changes which have resolved.  She never had significant fatigue and was able to continue to work right through the treatment.  A detailed review of systems today was otherwise stable.   COVID 19 VACCINATION STATUS: fully vaccinated [employer]), with booster 08/2019   HISTORY OF CURRENT ILLNESS: From the original intake note:  Kimberly Klein (pronounced "Miss-ELLy") --goes by "Lorry" --had routine screening mammography on 02/24/2020 showing possible microcalcifications in the right breast and a possible mass in the left breast. She underwent bilateral diagnostic mammography with tomography and bilateral breast ultrasonography at [facility] on 03/20/2020 showing: breast density category B; lower-outer right breast calcifications spanning at least 5 cm; indeterminate 1 cm upper left breast mass; no abnormal-appearing axillary lymph nodes.  Accordingly on 03/28/2020 she proceeded to biopsy of the bilateral breast areas in question. The pathology from this procedure ([id number]) showed:  1. Left Breast, 12 o'clock  - fibroadenoma; concordant 2. Right Breast, LOQ (posterior)  - ductal carcinoma in situ, intermediate to high grade, with necrosis and calcifications  - Prognostic indicators significant for: estrogen receptor, 20% positive with weak staining intensity and progesterone receptor, 0% negative.  3. Right Breast, LOQ (anterior)  - ductal carcinoma in situ, intermediate to high grade, with necrosis and calcifications  - complex  sclerosing lesion with  calcifications   Cancer Staging  Ductal carcinoma in situ (DCIS) of right breast Staging form: Breast, AJCC 8th Edition - Clinical stage from 04/04/2020: Stage 0 (cTis (DCIS), cN0, cM0, ER+, PR-) - Signed by Chauncey Cruel, MD on 04/04/2020 Stage prefix: Initial diagnosis Nuclear grade: GX Laterality: Right Staged by: Pathologist and managing physician Stage used in treatment planning: Yes National guidelines used in treatment planning: Yes Type of national guideline used in treatment planning: NCCN  The patient's subsequent history is as detailed below.   PAST MEDICAL HISTORY: Past Medical History:  Diagnosis Date   Back pain    Breast cancer ([place]) 03/2020   Dysrhythmia    SVT at times   History of radiation therapy    Right breast -08/20/20-10/04/20- Dr. Gery Pray   Hx of cold sores    Hypertension 2019   Joint pain    Macular degeneration    Palpitations    Sciatica    SVT (supraventricular tachycardia) ([place]) 07/2013   strees test- normal    PAST SURGICAL HISTORY: Past Surgical History:  Procedure Laterality Date   appendectomy     APPENDECTOMY  1921   AUGMENTATION MAMMAPLASTY Bilateral    BREAST LUMPECTOMY WITH RADIOACTIVE SEED LOCALIZATION Right 05/02/2020   Procedure: RIGHT BREAST LUMPECTOMY WITH RADIOACTIVE SEED LOCALIZATION X 2;  Surgeon: [name], MD;  Location: [facility];  Service: General;  Laterality: Right;   EYE SURGERY Bilateral 2022   cataract surgery   MASTOPEXY Bilateral 07/11/2020   Procedure: BILATERAL MASTOPEXY AND REMOVAL OF IMPLANTS WITH COMPLETE CAPSULECTOMY;  Surgeon: Wallace Going, DO;  Location: [place];  Service: Plastics;  Laterality: Bilateral;   RE-EXCISION OF BREAST LUMPECTOMY Right 07/11/2020   Procedure: RE-EXCISION OF RIGHT BREAST LUMPECTOMY;  Surgeon: [name], MD;  Location: [facility];  Service: General;  Laterality: Right;    FAMILY HISTORY: Family History  Problem Relation  Age of Onset   Depression Mother    Obesity Mother    Hypertension Father    Hyperlipidemia Father    Heart attack Father    Stroke Father    Heart disease Father    Liver cancer Sister    Bladder Cancer Brother    Multiple myeloma Brother   Her parents both died at the age of 47, her father from CVA and her mother from Alzheimer's.  The patient has 6 brothers and 5 sisters. She reports multiple myeloma in a brother at age 46, bladder cancer in a brother at 74, and cholangiocarcinoma in a sister at age 98.   GYNECOLOGIC HISTORY:  No LMP recorded. Patient is postmenopausal. Menarche: 70 years old Age at first live birth: 70 years old [place] P 3 LMP 2004 Contraceptive never used HRT never used  Hysterectomy? no BSO? 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 She is widowed. She lives at home by herself, with her poodle Macao. Daughter Amy, age 15, is a midwife in [place]. Daughter Raquel Sarna, age 47, teaches early pre-school here in [place]. Kimberly Klein has 4 grandchildren. She is not a Designer, fashion/clothing.    ADVANCED DIRECTIVES: in place, daughters Amy and Raquel Sarna are her HCPOA   HEALTH MAINTENANCE: Social History   Tobacco Use   Smoking status: Former    Packs/day: 0.00    Years: 20.00    Pack years: 0.00    Types: Cigarettes    Quit date: 10/03/2007    Years since quitting: 35.1  Smokeless tobacco: Never  Vaping Use   Vaping Use: Never used  Substance Use Topics   Alcohol use: Yes    Alcohol/week: 4.0 - 5.0 standard drinks    Types: 4 - 5 Glasses of wine per week    Comment: occasional   Drug use: No     Colonoscopy: 2016  PAP: 2010  Bone density: 2010   No Known Allergies  Current Outpatient Medications  Medication Sig Dispense Refill   anastrozole (ARIMIDEX) 1 MG tablet Take 1 tablet (1 mg total) by mouth daily. 90 tablet 4   amLODipine (NORVASC) 5 MG tablet TAKE 1 TABLET(5 MG) BY MOUTH DAILY (Patient taking  differently: Take 5 mg by mouth daily. TAKE 1 TABLET(5 MG) BY MOUTH DAILY) 90 tablet 1   lisinopril (ZESTRIL) 20 MG tablet Take 20 mg by mouth daily.     Multiple Vitamins-Minerals (PRESERVISION AREDS 2+MULTI VIT PO) Take 1 capsule by mouth in the morning and at bedtime.     Semaglutide,0.25 or 0.5MG/DOS, (OZEMPIC, 0.25 OR 0.5 MG/DOSE,) 2 MG/1.5ML SOPN Inject 0.5 mg into the skin once a week. 1.5 mL 0   valACYclovir (VALTREX) 1000 MG tablet Take 1 tablet (1,000 mg total) by mouth 2 (two) times daily. (Patient taking differently: Take 1,000 mg by mouth 2 (two) times daily as needed (outbreaks).) 20 tablet 6   Vitamin D, Ergocalciferol, (DRISDOL) 1.25 MG (50000 UNIT) CAPS capsule Take 1 capsule (50,000 Units total) by mouth every 7 (seven) days. 12 capsule 0   No current facility-administered medications for this visit.    OBJECTIVE: White woman who appears well  There were no vitals filed for this visit.    There is no height or weight on file to calculate BMI.   Wt Readings from Last 3 Encounters:  11/13/20 164 lb (74.4 kg)  11/05/20 170 lb 6.4 oz (77.3 kg)  10/23/20 168 lb (76.2 kg)      ECOG FS:1 - Symptomatic but completely ambulatory  Telemedicine visit 11/29/2020   LAB RESULTS:  CMP     Component Value Date/Time   NA 139 06/28/2020 0834   NA 135 (L) 07/18/2013 1335   K 4.8 06/28/2020 0834   K 4.1 07/18/2013 1335   CL 100 06/28/2020 0834   CL 101 07/18/2013 1335   CO2 24 06/28/2020 0834   CO2 28 07/18/2013 1335   GLUCOSE 105 (H) 06/28/2020 0834   GLUCOSE 114 (H) 04/04/2020 1231   GLUCOSE 105 (H) 07/18/2013 1335   BUN 11 06/28/2020 0834   BUN 7 07/18/2013 1335   CREATININE 0.92 06/28/2020 0834   CREATININE 0.81 04/04/2020 1231   CREATININE 0.94 07/18/2013 1335   CALCIUM 9.7 06/28/2020 0834   CALCIUM 9.3 07/18/2013 1335   PROT 7.0 06/28/2020 0834   PROT 7.5 07/18/2013 1335   ALBUMIN 4.5 06/28/2020 0834   ALBUMIN 3.7 07/18/2013 1335   AST 17 06/28/2020 0834   AST  36 04/04/2020 1231   ALT 17 06/28/2020 0834   ALT 46 (H) 04/04/2020 1231   ALT 34 07/18/2013 1335   ALKPHOS 99 06/28/2020 0834   ALKPHOS 79 07/18/2013 1335   BILITOT 0.5 06/28/2020 0834   BILITOT 0.6 04/04/2020 1231   GFRNONAA >60 04/04/2020 1231   GFRNONAA >60 07/18/2013 1335   GFRAA 80 03/05/2020 1458   GFRAA >60 07/18/2013 1335    No results found for: TOTALPROTELP, ALBUMINELP, A1GS, A2GS, BETS, BETA2SER, GAMS, MSPIKE, SPEI  Lab Results  Component Value Date   WBC 5.5 06/28/2020  NEUTROABS 3.1 06/28/2020   HGB 13.9 06/28/2020   HCT 41.8 06/28/2020   MCV 95 06/28/2020   PLT 204 06/28/2020    No results found for: LABCA2  No components found for: [id number]  No results for input(s): INR in the last 168 hours.  No results found for: LABCA2  No results found for: PHX505  No results found for: WPV948  No results found for: AXK553  No results found for: CA2729  No components found for: HGQUANT  No results found for: CEA1 / No results found for: CEA1   No results found for: AFPTUMOR  No results found for: CHROMOGRNA  No results found for: KPAFRELGTCHN, LAMBDASER, KAPLAMBRATIO (kappa/lambda light chains)  No results found for: HGBA, HGBA2QUANT, HGBFQUANT, HGBSQUAN (Hemoglobinopathy evaluation)   No results found for: LDH  Lab Results  Component Value Date   IRON 105 06/28/2020   TIBC 312 06/28/2020   IRONPCTSAT 34 06/28/2020   (Iron and TIBC)  Lab Results  Component Value Date   FERRITIN 244 (H) 06/28/2020    Urinalysis    Component Value Date/Time   APPEARANCEUR Clear 09/27/2018 1504   GLUCOSEU Negative 09/27/2018 1504   BILIRUBINUR Negative 09/27/2018 1504   PROTEINUR Negative 09/27/2018 1504   NITRITE Negative 09/27/2018 1504   LEUKOCYTESUR Negative 09/27/2018 1504    STUDIES: No results found.   ELIGIBLE FOR AVAILABLE RESEARCH PROTOCOL: No  ASSESSMENT: 70 y.o. [place] woman status post right breast biopsy x2 on 03/28/2020 for  ductal carcinoma in situ measuring approximately 5 cm, grade 2 or 3, estrogen receptor weakly positive at 20%, progesterone receptor negative  (1) status post right lumpectomy 05/02/2020 for a 6.7 cm high-grade ductal carcinoma in situ, with focally positive margins.  (a) additional surgery 07/11/2020 obtained at negative margins.  (2) adjuvant radiation 08/20/2020 through 10/04/2020 Site Technique Total Dose (Gy) Dose per Fx (Gy) Completed Fx Beam Energies  Breast, Right: Breast_Rt 3D 50.4/50.4 1.8 28/28 6X, 10X  Breast, Right: Breast_Rt_Bst 3D 10/10 2 5/5 6X, 10X   (3) anastrozole started 11/29/2020   PLAN: Lorry tolerated local treatment for her noninvasive breast cancer without any complications.  She has an excellent functional status.  Her tumor was at best to weakly estrogen receptor positive and I would not anticipate her having any significant local recurrence reduction from taking antiestrogens  Nevertheless I think antiestrogens are indicated in her case for prophylaxis and we discussed the difference between anastrozole and tamoxifen in detail.  Were going to go with anastrozole at least initially.  I went ahead and put in the prescription for her.  If she has any difficulty from this she will let us know and we can always change her to tamoxifen.  Otherwise she is already scheduled for mammography in April and she will see [place] again in May.  I anticipate we will see her on a yearly basis thereafter until she completes her 5 years of follow-up  I added a bone density to her mammography orders for April 2023.  Total encounter time 15 minutes.Sarajane Jews C. Magrinat, MD 11/29/2020 9:59 AM Medical Oncology and Hematology [facility] [place], [place] [address] Tel. [phone]    Fax. [phone]   This document serves as a record of services personally performed by Lurline Del, MD. It was created on his behalf by Wilburn Mylar, a trained  medical scribe. The creation of this record is based on the scribe's personal observations and the provider's statements to them.  I, Lurline Del MD, have reviewed the above documentation for accuracy and completeness, and I agree with the above.   *Total Encounter Time as defined by the Centers for Medicare and Medicaid Services includes, in addition to the face-to-face time of a patient visit (documented in the note above) non-face-to-face time: obtaining and reviewing outside history, ordering and reviewing medications, tests or procedures, care coordination (communications with other health care professionals or caregivers) and documentation in the medical record.

## 2020-11-29 ENCOUNTER — Inpatient Hospital Stay: Payer: PPO | Admitting: Oncology

## 2020-11-29 DIAGNOSIS — D0511 Intraductal carcinoma in situ of right breast: Secondary | ICD-10-CM | POA: Insufficient documentation

## 2020-11-29 MED ORDER — ANASTROZOLE 1 MG PO TABS: 1.0000 mg | ORAL_TABLET | Freq: Every day | ORAL | 4 refills | Status: DC

## 2020-12-02 ENCOUNTER — Encounter: Payer: Self-pay | Admitting: Gastroenterology

## 2020-12-07 ENCOUNTER — Other Ambulatory Visit: Payer: Self-pay | Admitting: Family Medicine

## 2020-12-07 NOTE — Telephone Encounter (Signed)
Requested medication (s) are due for refill today: -  Requested medication (s) are on the active medication list: yes  Last refill:  08/08/20  Future visit scheduled: yes  Notes to clinic:  historical med and provider   Requested Prescriptions  Pending Prescriptions Disp Refills   lisinopril (ZESTRIL) 20 MG tablet [Pharmacy Med Name: LISINOPRIL 20MG  TABLETS] 90 tablet     Sig: TAKE 1 TABLET(20 MG) BY MOUTH DAILY     Cardiovascular:  ACE Inhibitors Passed - 12/07/2020  3:34 AM      Passed - Cr in normal range and within 180 days    Creatinine  Date Value Ref Range Status  04/04/2020 0.81 0.44 - 1.00 mg/dL Final  07/18/2013 0.94 0.60 - 1.30 mg/dL Final   Creatinine, Ser  Date Value Ref Range Status  06/28/2020 0.92 0.57 - 1.00 mg/dL Final          Passed - K in normal range and within 180 days    Potassium  Date Value Ref Range Status  06/28/2020 4.8 3.5 - 5.2 mmol/L Final  07/18/2013 4.1 3.5 - 5.1 mmol/L Final          Passed - Patient is not pregnant      Passed - Last BP in normal range    BP Readings from Last 1 Encounters:  11/13/20 109/69          Passed - Valid encounter within last 6 months    Recent Outpatient Visits           9 months ago Primary hypertension   [place], [place], DO   1 year ago Essential hypertension   [place], [place], DO   1 year ago Essential hypertension   [place], Megan P, DO   2 years ago Acute nonintractable headache, unspecified headache type   [facility], Megan P, DO   4 years ago Elevated blood pressure reading in office with white coat syndrome, without diagnosis of hypertension   [name], [place], DO       Future Appointments             In 3 weeks Wynetta Emery, Barb Merino, DO [place], [place]   In 8 months  [employer], [place]

## 2020-12-09 ENCOUNTER — Other Ambulatory Visit (INDEPENDENT_AMBULATORY_CARE_PROVIDER_SITE_OTHER): Payer: Self-pay | Admitting: Family Medicine

## 2020-12-09 DIAGNOSIS — R7301 Impaired fasting glucose: Secondary | ICD-10-CM

## 2020-12-10 NOTE — Telephone Encounter (Signed)
Lmom for patient to return call to discuss scheduling an appointment.

## 2020-12-10 NOTE — Telephone Encounter (Signed)
Patient is overdue for appointment. Was due 09/02/20, please call to schedule f/up.

## 2020-12-10 NOTE — Telephone Encounter (Signed)
Pt last seen by Dawn Whitmire, FNP.  

## 2020-12-11 ENCOUNTER — Other Ambulatory Visit: Payer: Self-pay

## 2020-12-11 ENCOUNTER — Encounter (INDEPENDENT_AMBULATORY_CARE_PROVIDER_SITE_OTHER): Payer: Self-pay | Admitting: Family Medicine

## 2020-12-11 ENCOUNTER — Ambulatory Visit (INDEPENDENT_AMBULATORY_CARE_PROVIDER_SITE_OTHER): Payer: PPO | Admitting: Family Medicine

## 2020-12-11 VITALS — BP 136/77 | HR 84 | Temp 97.6°F | Ht 62.0 in | Wt 162.0 lb

## 2020-12-11 DIAGNOSIS — R7301 Impaired fasting glucose: Secondary | ICD-10-CM | POA: Diagnosis not present

## 2020-12-11 DIAGNOSIS — E669 Obesity, unspecified: Secondary | ICD-10-CM

## 2020-12-11 DIAGNOSIS — E559 Vitamin D deficiency, unspecified: Secondary | ICD-10-CM | POA: Diagnosis not present

## 2020-12-11 DIAGNOSIS — D0511 Intraductal carcinoma in situ of right breast: Secondary | ICD-10-CM | POA: Diagnosis not present

## 2020-12-11 DIAGNOSIS — E7800 Pure hypercholesterolemia, unspecified: Secondary | ICD-10-CM | POA: Diagnosis not present

## 2020-12-11 DIAGNOSIS — Z6832 Body mass index (BMI) 32.0-32.9, adult: Secondary | ICD-10-CM | POA: Diagnosis not present

## 2020-12-11 MED ORDER — OZEMPIC (0.25 OR 0.5 MG/DOSE) 2 MG/1.5ML ~~LOC~~ SOPN: 0.5000 mg | PEN_INJECTOR | SUBCUTANEOUS | 0 refills | Status: DC

## 2020-12-11 MED ORDER — VITAMIN D (ERGOCALCIFEROL) 1.25 MG (50000 UNIT) PO CAPS: 50000.0000 [IU] | ORAL_CAPSULE | ORAL | 0 refills | Status: DC

## 2020-12-11 MED ORDER — SEMAGLUTIDE (1 MG/DOSE) 4 MG/3ML ~~LOC~~ SOPN: 1.0000 mg | PEN_INJECTOR | SUBCUTANEOUS | 0 refills | Status: AC

## 2020-12-11 NOTE — Telephone Encounter (Signed)
Pt has a physical 12/19, will this suffice?

## 2020-12-12 LAB — VITAMIN D 25 HYDROXY (VIT D DEFICIENCY, FRACTURES): Vit D, 25-Hydroxy: 34.7 ng/mL (ref 30.0–100.0)

## 2020-12-12 LAB — COMPREHENSIVE METABOLIC PANEL
ALT: 20 IU/L (ref 0–32)
AST: 19 IU/L (ref 0–40)
Albumin/Globulin Ratio: 1.9 (ref 1.2–2.2)
Albumin: 4.3 g/dL (ref 3.7–4.7)
Alkaline Phosphatase: 82 IU/L (ref 44–121)
BUN/Creatinine Ratio: 12 (ref 12–28)
BUN: 10 mg/dL (ref 8–27)
Bilirubin Total: 0.5 mg/dL (ref 0.0–1.2)
CO2: 24 mmol/L (ref 20–29)
Calcium: 9.6 mg/dL (ref 8.7–10.3)
Chloride: 99 mmol/L (ref 96–106)
Creatinine, Ser: 0.84 mg/dL (ref 0.57–1.00)
Globulin, Total: 2.3 g/dL (ref 1.5–4.5)
Glucose: 89 mg/dL (ref 70–99)
Potassium: 4.5 mmol/L (ref 3.5–5.2)
Sodium: 139 mmol/L (ref 134–144)
Total Protein: 6.6 g/dL (ref 6.0–8.5)
eGFR: 74 mL/min/{1.73_m2} (ref >59)

## 2020-12-12 LAB — CBC WITH DIFFERENTIAL/PLATELET
Basophils Absolute: 0 10*3/uL (ref 0.0–0.2)
Basos: 1 %
EOS (ABSOLUTE): 0.1 10*3/uL (ref 0.0–0.4)
Eos: 1 %
Hematocrit: 45.1 % (ref 34.0–46.6)
Hemoglobin: 14.7 g/dL (ref 11.1–15.9)
Immature Grans (Abs): 0 10*3/uL (ref 0.0–0.1)
Immature Granulocytes: 1 %
Lymphocytes Absolute: 1 10*3/uL (ref 0.7–3.1)
Lymphs: 17 %
MCH: 30.6 pg (ref 26.6–33.0)
MCHC: 32.6 g/dL (ref 31.5–35.7)
MCV: 94 fL (ref 79–97)
Monocytes Absolute: 0.6 10*3/uL (ref 0.1–0.9)
Monocytes: 10 %
Neutrophils Absolute: 4.1 10*3/uL (ref 1.4–7.0)
Neutrophils: 70 %
Platelets: 256 10*3/uL (ref 150–450)
RBC: 4.81 x10E6/uL (ref 3.77–5.28)
RDW: 11.8 % (ref 11.7–15.4)
WBC: 5.8 10*3/uL (ref 3.4–10.8)

## 2020-12-12 LAB — LIPID PANEL
Chol/HDL Ratio: 4.1 ratio (ref 0.0–4.4)
Cholesterol, Total: 273 mg/dL — ABNORMAL HIGH (ref 100–199)
HDL: 66 mg/dL (ref >39)
LDL Chol Calc (NIH): 186 mg/dL — ABNORMAL HIGH (ref 0–99)
Triglycerides: 121 mg/dL (ref 0–149)
VLDL Cholesterol Cal: 21 mg/dL (ref 5–40)

## 2020-12-12 LAB — VITAMIN B12: Vitamin B-12: 336 pg/mL (ref 232–1245)

## 2020-12-12 LAB — HEMOGLOBIN A1C
Est. average glucose Bld gHb Est-mCnc: 111 mg/dL
Hgb A1c MFr Bld: 5.5 % (ref 4.8–5.6)

## 2020-12-12 NOTE — Progress Notes (Signed)
Chief Complaint:   OBESITY Kimberly Klein is here to discuss her progress with her obesity treatment plan along with follow-up of her obesity related diagnoses. See Medical Weight Management Flowsheet for complete bioelectrical impedance results.  Today's visit was #: 8 Starting weight: 179 lbs Starting date: 06/28/2020 Weight change since last visit: 2 lbs Total lbs lost to date: 17 lbs Total weight loss percentage to date: -9.50%  Nutrition Plan: Keeping a food journal and adhering to recommended goals of 1100-1200 calories and 75 grams of protein daily for 75% of the time. Activity: Walking for 20 minutes 5 times per week. Anti-obesity medications: Ozempic 0.5 mg subcutaneously weekly. Reported side effects: None.  Interim History: Kimberly Klein says she is getting in 20 grams of protein with every meal.  She is supplementing with a protein shake.  Assessment/Plan:   1. Impaired fasting glucose/Pre-diabetes Controlled. Goal is HgbA1c < 5.7.  Medication: Ozempic 1 mg subcutaneously weekly.    Plan: Increase Ozempic to 1 mg subcutaneously weekly.  She will continue to focus on protein-rich, low simple carbohydrate foods. We reviewed the importance of hydration, regular exercise for stress reduction, and restorative sleep. Will check A1c today.  Lab Results  Component Value Date   HGBA1C 5.5 12/11/2020   Lab Results  Component Value Date   INSULIN 9.1 06/28/2020   - Hemoglobin A1c - Increase Semaglutide, 1 MG/DOSE, 4 MG/3ML SOPN; Inject 1 mg as directed once a week for 28 days.  Dispense: 3 mL; Refill: 0  2. Vitamin D deficiency Not at goal.   Plan: Continue to take prescription Vitamin D @50 ,000 IU every week as prescribed.  Will check vitamin D level today.  Lab Results  Component Value Date   VD25OH 34.7 12/11/2020   VD25OH 17.5 (L) 06/28/2020   - Refill Vitamin D, Ergocalciferol, (DRISDOL) 1.25 MG (50000 UNIT) CAPS capsule; Take 1 capsule (50,000 Units total) by mouth  every 7 (seven) days.  Dispense: 12 capsule; Refill: 0 - VITAMIN D 25 Hydroxy (Vit-D Deficiency, Fractures)  3. Pure hypercholesterolemia Course: Not at goal. Lipid-lowering medications: None.   Plan: Dietary changes: Increase soluble fiber, decrease simple carbohydrates, decrease saturated fat. Exercise changes: Moderate to vigorous-intensity aerobic activity 150 minutes per week or as tolerated. We will continue to monitor along with PCP/specialists as it pertains to her weight loss journey.  Check labs today.  Lab Results  Component Value Date   CHOL 273 (H) 12/11/2020   HDL 66 12/11/2020   LDLCALC 186 (H) 12/11/2020   TRIG 121 12/11/2020   CHOLHDL 4.1 12/11/2020   Lab Results  Component Value Date   ALT 20 12/11/2020   AST 19 12/11/2020   ALKPHOS 82 12/11/2020   BILITOT 0.5 12/11/2020   The 10-year ASCVD risk score (Arnett DK, et al., 2019) is: 18.1%   Values used to calculate the score:     Age: 70 years     Sex: Female     Is Non-Hispanic African American: No     Diabetic: No     Tobacco smoker: No     Systolic Blood Pressure: 709 mmHg     Is BP treated: Yes     HDL Cholesterol: 66 mg/dL     Total Cholesterol: 273 mg/dL  - Comprehensive metabolic panel - Lipid panel  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 We will continue to monitor symptoms as they relate to her weight loss journey.  Will check labs today.  -  CBC with Differential/Platelet - Vitamin B12  5. Overweight, Current BMI 29.7  Course: Kimberly Klein is currently in the action stage of change. As such, her goal is to continue with weight loss efforts.   Nutrition goals: She has agreed to keeping a food journal and adhering to recommended goals of 1100-1200 calories and 75 grams of protein.   Exercise goals:  As is.  Behavioral modification strategies: increasing lean protein intake, decreasing simple carbohydrates, increasing vegetables, increasing water intake, and  decreasing liquid calories.  Kimberly Klein has agreed to follow-up with our clinic in 4 weeks. She was informed of the importance of frequent follow-up visits to maximize her success with intensive lifestyle modifications for her multiple health conditions.   Kimberly Klein was informed we would discuss her lab results at her next visit unless there is a critical issue that needs to be addressed sooner. Kimberly Klein agreed to keep her next visit at the agreed upon time to discuss these results.  Objective:   Blood pressure 136/77, pulse 84, temperature 97.6 F (36.4 C), height 5\' 2"  (1.575 m), weight 162 lb (73.5 kg), SpO2 96 %. Body mass index is 29.63 kg/m.  General: Cooperative, alert, well developed, in no acute distress. HEENT: Conjunctivae and lids unremarkable. Cardiovascular: Regular rhythm.  Lungs: Normal work of breathing. Neurologic: No focal deficits.   Lab Results  Component Value Date   CREATININE 0.84 12/11/2020   BUN 10 12/11/2020   NA 139 12/11/2020   K 4.5 12/11/2020   CL 99 12/11/2020   CO2 24 12/11/2020   Lab Results  Component Value Date   ALT 20 12/11/2020   AST 19 12/11/2020   ALKPHOS 82 12/11/2020   BILITOT 0.5 12/11/2020   Lab Results  Component Value Date   HGBA1C 5.5 12/11/2020   HGBA1C 5.7 (H) 06/28/2020   HGBA1C 5.3 09/27/2018   HGBA1C 4.9 05/30/2016   Lab Results  Component Value Date   INSULIN 9.1 06/28/2020   Lab Results  Component Value Date   TSH 0.998 06/28/2020   Lab Results  Component Value Date   CHOL 273 (H) 12/11/2020   HDL 66 12/11/2020   LDLCALC 186 (H) 12/11/2020   TRIG 121 12/11/2020   CHOLHDL 4.1 12/11/2020   Lab Results  Component Value Date   VD25OH 34.7 12/11/2020   VD25OH 17.5 (L) 06/28/2020   Lab Results  Component Value Date   WBC 5.8 12/11/2020   HGB 14.7 12/11/2020   HCT 45.1 12/11/2020   MCV 94 12/11/2020   PLT 256 12/11/2020   Lab Results  Component Value Date   IRON 105 06/28/2020   TIBC 312 06/28/2020    FERRITIN 244 (H) 06/28/2020   Attestation Statements:   Reviewed by clinician on day of visit: allergies, medications, problem list, medical history, surgical history, family history, social history, and previous encounter notes.  I, Water quality scientist, CMA, am acting as transcriptionist for Briscoe Deutscher, DO  I have reviewed the above documentation for accuracy and completeness, and I agree with the above. -  Briscoe Deutscher, DO, MS, FAAFP, DABOM - Family and Bariatric Medicine.

## 2020-12-31 ENCOUNTER — Ambulatory Visit (INDEPENDENT_AMBULATORY_CARE_PROVIDER_SITE_OTHER): Payer: PPO | Admitting: Family Medicine

## 2020-12-31 ENCOUNTER — Encounter: Payer: Self-pay | Admitting: Family Medicine

## 2020-12-31 ENCOUNTER — Other Ambulatory Visit: Payer: Self-pay

## 2020-12-31 VITALS — BP 134/84 | HR 78 | Temp 97.7°F | Ht 61.5 in | Wt 168.2 lb

## 2020-12-31 DIAGNOSIS — E559 Vitamin D deficiency, unspecified: Secondary | ICD-10-CM | POA: Diagnosis not present

## 2020-12-31 DIAGNOSIS — R7301 Impaired fasting glucose: Secondary | ICD-10-CM | POA: Diagnosis not present

## 2020-12-31 DIAGNOSIS — I10 Essential (primary) hypertension: Secondary | ICD-10-CM | POA: Diagnosis not present

## 2020-12-31 DIAGNOSIS — Z0000 Encounter for general adult medical examination without abnormal findings: Secondary | ICD-10-CM

## 2020-12-31 DIAGNOSIS — K219 Gastro-esophageal reflux disease without esophagitis: Secondary | ICD-10-CM | POA: Diagnosis not present

## 2020-12-31 LAB — MICROALBUMIN, URINE WAIVED
Creatinine, Urine Waived: 100 mg/dL (ref 10–300)
Microalb, Ur Waived: 30 mg/L — ABNORMAL HIGH (ref 0–19)
Microalb/Creat Ratio: <30 mg/g (ref <30)

## 2020-12-31 MED ORDER — AMLODIPINE BESYLATE 5 MG PO TABS: 5.0000 mg | ORAL_TABLET | Freq: Every day | ORAL | 1 refills | Status: DC

## 2020-12-31 MED ORDER — VALACYCLOVIR HCL 1 G PO TABS: 1000.0000 mg | ORAL_TABLET | Freq: Two times a day (BID) | ORAL | 6 refills | Status: DC

## 2020-12-31 MED ORDER — LISINOPRIL 20 MG PO TABS: ORAL_TABLET | ORAL | 1 refills | Status: DC

## 2020-12-31 NOTE — Assessment & Plan Note (Signed)
Stable. Continue to monitor. Call with any concerns. Continue to monitor.  ?

## 2020-12-31 NOTE — Assessment & Plan Note (Signed)
Under good control on current regimen. Continue current regimen. Continue to monitor. Call with any concerns. Refills given. Labs drawn today.   

## 2020-12-31 NOTE — Assessment & Plan Note (Signed)
Stable.       - Continue to monitor

## 2020-12-31 NOTE — Progress Notes (Signed)
BP 134/84    Pulse 78    Temp 97.7 F (36.5 C)    Ht 5' 1.5" (1.562 m)    Wt 168 lb 3.2 oz (76.3 kg)    SpO2 98%    BMI 31.27 kg/m    Subjective:    Patient ID: Kimberly Klein, female    DOB: March 09, 1950, 70 y.o.   MRN: [id number]  HPI: Kimberly Klein is a 70 y.o. female presenting on 12/31/2020 for comprehensive medical examination. Current medical complaints include:  HYPERTENSION Hypertension status: controlled  Satisfied with current treatment? yes Duration of hypertension: chronic BP monitoring frequency:  not checking BP medication side effects:  no Medication compliance: excellent compliance Previous BP meds:amlodipine, lisinopril Aspirin: no Recurrent headaches: no Visual changes: no Palpitations: no Dyspnea: no Chest pain: no Lower extremity edema: no Dizzy/lightheaded: no  Impaired Fasting Glucose HbA1C:  Lab Results  Component Value Date   HGBA1C 5.5 12/11/2020   Duration of elevated blood sugar: chronic Polydipsia: no Polyuria: no Weight change: yes Visual disturbance: no Glucose Monitoring: no Diabetic Education: Completed Family history of diabetes: yes  Menopausal Symptoms: no  Depression Screen done today and results listed below:  Depression screen [facility] 2/9 12/31/2020 08/13/2020 06/28/2020 04/27/2019 05/30/2016  Decreased Interest 0 0 3 0 0  Down, Depressed, Hopeless 0 0 3 0 0  PHQ - 2 Score 0 0 6 0 0  Altered sleeping 1 - 1 - -  Tired, decreased energy 0 - 3 - -  Change in appetite 0 - 2 - -  Feeling bad or failure about yourself  0 - 1 - -  Trouble concentrating 0 - 0 - -  Moving slowly or fidgety/restless 0 - 1 - -  Suicidal thoughts 0 - 0 - -  PHQ-9 Score 1 - 14 - -  Difficult doing work/chores - - Somewhat difficult - -    Past Medical History:  Past Medical History:  Diagnosis Date   Back pain    Breast cancer ([place]) 03/2020   Dysrhythmia    SVT at times   History of radiation therapy    Right breast -08/20/20-10/04/20- Dr.  Gery Pray   Hx of cold sores    Hypertension 2019   Joint pain    Macular degeneration    Palpitations    Sciatica    SVT (supraventricular tachycardia) ([place]) 07/2013   strees test- normal    Surgical History:  Past Surgical History:  Procedure Laterality Date   appendectomy     APPENDECTOMY  1921   AUGMENTATION MAMMAPLASTY Bilateral    BREAST LUMPECTOMY WITH RADIOACTIVE SEED LOCALIZATION Right 05/02/2020   Procedure: RIGHT BREAST LUMPECTOMY WITH RADIOACTIVE SEED LOCALIZATION X 2;  Surgeon: [name], MD;  Location: [facility];  Service: General;  Laterality: Right;   EYE SURGERY Bilateral 2022   cataract surgery   MASTOPEXY Bilateral 07/11/2020   Procedure: BILATERAL MASTOPEXY AND REMOVAL OF IMPLANTS WITH COMPLETE CAPSULECTOMY;  Surgeon: Wallace Going, DO;  Location: [facility];  Service: Plastics;  Laterality: Bilateral;   RE-EXCISION OF BREAST LUMPECTOMY Right 07/11/2020   Procedure: RE-EXCISION OF RIGHT BREAST LUMPECTOMY;  Surgeon: [name], MD;  Location: [facility];  Service: General;  Laterality: Right;    Medications:  Current Outpatient Medications on File Prior to Visit  Medication Sig   anastrozole (ARIMIDEX) 1 MG tablet Take 1 tablet (1 mg total) by mouth daily.   Multiple Vitamins-Minerals (PRESERVISION AREDS 2+MULTI VIT PO) Take 1  capsule by mouth in the morning and at bedtime.   Semaglutide, 1 MG/DOSE, 4 MG/3ML SOPN Inject 1 mg as directed once a week for 28 days.   Vitamin D, Ergocalciferol, (DRISDOL) 1.25 MG (50000 UNIT) CAPS capsule Take 1 capsule (50,000 Units total) by mouth every 7 (seven) days.   No current facility-administered medications on file prior to visit.    Allergies:  No Known Allergies  Social History:  Social History   Socioeconomic History   Marital status: Widowed    Spouse name: Not on file   Number of children: Not on file   Years of education: Not on file   Highest education level: Not on file   Occupational History   Occupation: PRN RN   Tobacco Use   Smoking status: Former    Packs/day: 0.00    Years: 20.00    Pack years: 0.00    Types: Cigarettes    Quit date: 10/03/2007    Years since quitting: 13.2   Smokeless tobacco: Never  Vaping Use   Vaping Use: Never used  Substance and Sexual Activity   Alcohol use: Yes    Alcohol/week: 4.0 - 5.0 standard drinks    Types: 4 - 5 Glasses of wine per week    Comment: occasional   Drug use: No   Sexual activity: Not Currently    Birth control/protection: Post-menopausal  Other Topics Concern   Not on file  Social History Narrative   Not on file   Social Determinants of Health   Financial Resource Strain: Low Risk    Difficulty of Paying Living Expenses: Not hard at all  Food Insecurity: No Food Insecurity   Worried About Charity fundraiser in the Last Year: Never true   Ran Out of Food in the Last Year: Never true  Transportation Needs: No Transportation Needs   Lack of Transportation (Medical): No   Lack of Transportation (Non-Medical): No  Physical Activity: Sufficiently Active   Days of Exercise per Week: 4 days   Minutes of Exercise per Session: 40 min  Stress: No Stress Concern Present   Feeling of Stress : Not at all  Social Connections: Not on file  Intimate Partner Violence: Not on file   Social History   Tobacco Use  Smoking Status Former   Packs/day: 0.00   Years: 20.00   Pack years: 0.00   Types: Cigarettes   Quit date: 10/03/2007   Years since quitting: 13.2  Smokeless Tobacco Never   Social History   Substance and Sexual Activity  Alcohol Use Yes   Alcohol/week: 4.0 - 5.0 standard drinks   Types: 4 - 5 Glasses of wine per week   Comment: occasional    Family History:  Family History  Problem Relation Age of Onset   Depression Mother    Obesity Mother    Hypertension Father    Hyperlipidemia Father    Heart attack Father    Stroke Father    Heart disease Father    Liver cancer  Sister    Bladder Cancer Brother    Multiple myeloma Brother     Past medical history, surgical history, medications, allergies, family history and social history reviewed with patient today and changes made to appropriate areas of the chart.   Review of Systems  Constitutional: Negative.   HENT: Negative.    Eyes:  Positive for blurred vision. Negative for double vision, photophobia, pain, discharge and redness.  Respiratory: Negative.    Cardiovascular: Negative.  Gastrointestinal: Negative.   Genitourinary: Negative.   Musculoskeletal:  Positive for back pain. Negative for falls, joint pain, myalgias and neck pain.  Skin: Negative.   Neurological: Negative.   Endo/Heme/Allergies: Negative.   Psychiatric/Behavioral: Negative.    All other ROS negative except what is listed above and in the HPI.      Objective:    BP 134/84    Pulse 78    Temp 97.7 F (36.5 C)    Ht 5' 1.5" (1.562 m)    Wt 168 lb 3.2 oz (76.3 kg)    SpO2 98%    BMI 31.27 kg/m   Wt Readings from Last 3 Encounters:  12/31/20 168 lb 3.2 oz (76.3 kg)  12/11/20 162 lb (73.5 kg)  11/13/20 164 lb (74.4 kg)    Physical Exam Vitals and nursing note reviewed.  Constitutional:      General: She is not in acute distress.    Appearance: Normal appearance. She is not ill-appearing, toxic-appearing or diaphoretic.  HENT:     Head: Normocephalic and atraumatic.     Right Ear: Tympanic membrane, ear canal and external ear normal. There is no impacted cerumen.     Left Ear: Tympanic membrane, ear canal and external ear normal. There is no impacted cerumen.     Nose: Nose normal. No congestion or rhinorrhea.     Mouth/Throat:     Mouth: Mucous membranes are moist.     Pharynx: Oropharynx is clear. No oropharyngeal exudate or posterior oropharyngeal erythema.  Eyes:     General: No scleral icterus.       Right eye: No discharge.        Left eye: No discharge.     Extraocular Movements: Extraocular movements intact.      Conjunctiva/sclera: Conjunctivae normal.     Pupils: Pupils are equal, round, and reactive to light.  Neck:     Vascular: No carotid bruit.  Cardiovascular:     Rate and Rhythm: Normal rate and regular rhythm.     Pulses: Normal pulses.     Heart sounds: No murmur heard.   No friction rub. No gallop.  Pulmonary:     Effort: Pulmonary effort is normal. No respiratory distress.     Breath sounds: Normal breath sounds. No stridor. No wheezing, rhonchi or rales.  Chest:     Chest wall: No tenderness.  Abdominal:     General: Abdomen is flat. Bowel sounds are normal. There is no distension.     Palpations: Abdomen is soft. There is no mass.     Tenderness: There is no abdominal tenderness. There is no right CVA tenderness, left CVA tenderness, guarding or rebound.     Hernia: No hernia is present.  Genitourinary:    Comments: Breast and pelvic exams deferred with shared decision making Musculoskeletal:        General: No swelling, tenderness, deformity or signs of injury.     Cervical back: Normal range of motion and neck supple. No rigidity. No muscular tenderness.     Right lower leg: No edema.     Left lower leg: No edema.  Lymphadenopathy:     Cervical: No cervical adenopathy.  Skin:    General: Skin is warm and dry.     Capillary Refill: Capillary refill takes less than 2 seconds.     Coloration: Skin is not jaundiced or pale.     Findings: No bruising, erythema, lesion or rash.  Neurological:     General:  No focal deficit present.     Mental Status: She is alert and oriented to person, place, and time. Mental status is at baseline.     Cranial Nerves: No cranial nerve deficit.     Sensory: No sensory deficit.     Motor: No weakness.     Coordination: Coordination normal.     Gait: Gait normal.     Deep Tendon Reflexes: Reflexes normal.  Psychiatric:        Mood and Affect: Mood normal.        Behavior: Behavior normal.        Thought Content: Thought content normal.         Judgment: Judgment normal.    Results for orders placed or performed in visit on 12/11/20  CBC with Differential/Platelet  Result Value Ref Range   WBC 5.8 3.4 - 10.8 x10E3/uL   RBC 4.81 3.77 - 5.28 x10E6/uL   Hemoglobin 14.7 11.1 - 15.9 g/dL   Hematocrit 45.1 34.0 - 46.6 %   MCV 94 79 - 97 fL   MCH 30.6 26.6 - 33.0 pg   MCHC 32.6 31.5 - 35.7 g/dL   RDW 11.8 11.7 - 15.4 %   Platelets 256 150 - 450 x10E3/uL   Neutrophils 70 Not Estab. %   Lymphs 17 Not Estab. %   Monocytes 10 Not Estab. %   Eos 1 Not Estab. %   Basos 1 Not Estab. %   Neutrophils Absolute 4.1 1.4 - 7.0 x10E3/uL   Lymphocytes Absolute 1.0 0.7 - 3.1 x10E3/uL   Monocytes Absolute 0.6 0.1 - 0.9 x10E3/uL   EOS (ABSOLUTE) 0.1 0.0 - 0.4 x10E3/uL   Basophils Absolute 0.0 0.0 - 0.2 x10E3/uL   Immature Granulocytes 1 Not Estab. %   Immature Grans (Abs) 0.0 0.0 - 0.1 x10E3/uL  Comprehensive metabolic panel  Result Value Ref Range   Glucose 89 70 - 99 mg/dL   BUN 10 8 - 27 mg/dL   Creatinine, Ser 0.84 0.57 - 1.00 mg/dL   eGFR 74 >59 mL/min/1.73   BUN/Creatinine Ratio 12 12 - 28   Sodium 139 134 - 144 mmol/L   Potassium 4.5 3.5 - 5.2 mmol/L   Chloride 99 96 - 106 mmol/L   CO2 24 20 - 29 mmol/L   Calcium 9.6 8.7 - 10.3 mg/dL   Total Protein 6.6 6.0 - 8.5 g/dL   Albumin 4.3 3.7 - 4.7 g/dL   Globulin, Total 2.3 1.5 - 4.5 g/dL   Albumin/Globulin Ratio 1.9 1.2 - 2.2   Bilirubin Total 0.5 0.0 - 1.2 mg/dL   Alkaline Phosphatase 82 44 - 121 IU/L   AST 19 0 - 40 IU/L   ALT 20 0 - 32 IU/L  Hemoglobin A1c  Result Value Ref Range   Hgb A1c MFr Bld 5.5 4.8 - 5.6 %   Est. average glucose Bld gHb Est-mCnc 111 mg/dL  Lipid panel  Result Value Ref Range   Cholesterol, Total 273 (H) 100 - 199 mg/dL   Triglycerides 121 0 - 149 mg/dL   HDL 66 >39 mg/dL   VLDL Cholesterol Cal 21 5 - 40 mg/dL   LDL Chol Calc (NIH) 186 (H) 0 - 99 mg/dL   Chol/HDL Ratio 4.1 0.0 - 4.4 ratio  VITAMIN D 25 Hydroxy (Vit-D Deficiency, Fractures)   Result Value Ref Range   Vit D, 25-Hydroxy 34.7 30.0 - 100.0 ng/mL  Vitamin B12  Result Value Ref Range   Vitamin B-12 336 232 - 1,245  pg/mL      Assessment & Plan:   Problem List Items Addressed This Visit       Cardiovascular and Mediastinum   Essential hypertension    Under good control on current regimen. Continue current regimen. Continue to monitor. Call with any concerns. Refills given. Labs drawn today.        Relevant Medications   lisinopril (ZESTRIL) 20 MG tablet   amLODipine (NORVASC) 5 MG tablet   Other Relevant Orders   Urinalysis, Routine w reflex microscopic   TSH   Microalbumin, Urine Waived     Digestive   GERD (gastroesophageal reflux disease)    Under good control on current regimen. Continue current regimen. Continue to monitor. Call with any concerns.          Endocrine   Impaired fasting glucose/Prediabetes    Stable. Continue to monitor. Call with any concerns. Continue to monitor.       Relevant Orders   Urinalysis, Routine w reflex microscopic   Microalbumin, Urine Waived     Other   Vitamin D deficiency    Stable. Continue to monitor.       Other Visit Diagnoses     Routine general medical examination at a health care facility    -  Primary   Vaccines up to date. Screening labs checked today. Mammo and colonscopy up to date. DEXA ordered. Continue diet and exercise. Call with any concerns.         Follow up plan: Return in about 6 months (around 07/01/2021).   LABORATORY TESTING:  - Pap smear: not applicable  IMMUNIZATIONS:   - Tdap: Tetanus vaccination status reviewed: last tetanus booster within 10 years. - Influenza: Up to date - Pneumovax: Up to date - Prevnar: Up to date - COVID: Up to date          - Shingrix vaccine: Given elsewhere  SCREENING: -Mammogram: Up to date  - Colonoscopy: Up to date  - Bone Density: Ordered today   PATIENT COUNSELING:   Advised to take 1 mg of folate supplement per day if  capable of pregnancy.   Sexuality: Discussed sexually transmitted diseases, partner selection, use of condoms, avoidance of unintended pregnancy  and contraceptive alternatives.   Advised to avoid cigarette smoking.  I discussed with the patient that most people either abstain from alcohol or drink within safe limits (<=14/week and <=4 drinks/occasion for males, <=7/weeks and <= 3 drinks/occasion for females) and that the risk for alcohol disorders and other health effects rises proportionally with the number of drinks per week and how often a drinker exceeds daily limits.  Discussed cessation/primary prevention of drug use and availability of treatment for abuse.   Diet: Encouraged to adjust caloric intake to maintain  or achieve ideal body weight, to reduce intake of dietary saturated fat and total fat, to limit sodium intake by avoiding high sodium foods and not adding table salt, and to maintain adequate dietary potassium and calcium preferably from fresh fruits, vegetables, and low-fat dairy products.    stressed the importance of regular exercise  Injury prevention: Discussed safety belts, safety helmets, smoke detector, smoking near bedding or upholstery.   Dental health: Discussed importance of regular tooth brushing, flossing, and dental visits.    NEXT PREVENTATIVE PHYSICAL DUE IN 1 YEAR. Return in about 6 months (around 07/01/2021).

## 2020-12-31 NOTE — Assessment & Plan Note (Signed)
Under good control on current regimen. Continue current regimen. Continue to monitor. Call with any concerns.   

## 2021-01-01 LAB — TSH: TSH: 0.957 u[IU]/mL (ref 0.450–4.500)

## 2021-01-10 ENCOUNTER — Telehealth: Payer: Self-pay | Admitting: *Deleted

## 2021-01-15 ENCOUNTER — Other Ambulatory Visit: Payer: Self-pay

## 2021-01-15 ENCOUNTER — Encounter (INDEPENDENT_AMBULATORY_CARE_PROVIDER_SITE_OTHER): Payer: Self-pay | Admitting: Family Medicine

## 2021-01-15 ENCOUNTER — Ambulatory Visit (INDEPENDENT_AMBULATORY_CARE_PROVIDER_SITE_OTHER): Payer: PPO | Admitting: Family Medicine

## 2021-01-15 VITALS — BP 129/81 | HR 84 | Temp 97.6°F | Ht 62.0 in | Wt 164.0 lb

## 2021-01-15 DIAGNOSIS — E538 Deficiency of other specified B group vitamins: Secondary | ICD-10-CM

## 2021-01-15 DIAGNOSIS — R7303 Prediabetes: Secondary | ICD-10-CM | POA: Insufficient documentation

## 2021-01-15 DIAGNOSIS — Z6832 Body mass index (BMI) 32.0-32.9, adult: Secondary | ICD-10-CM

## 2021-01-15 DIAGNOSIS — E7800 Pure hypercholesterolemia, unspecified: Secondary | ICD-10-CM

## 2021-01-15 DIAGNOSIS — Z9189 Other specified personal risk factors, not elsewhere classified: Secondary | ICD-10-CM | POA: Diagnosis not present

## 2021-01-15 DIAGNOSIS — E559 Vitamin D deficiency, unspecified: Secondary | ICD-10-CM

## 2021-01-15 DIAGNOSIS — E669 Obesity, unspecified: Secondary | ICD-10-CM

## 2021-01-15 MED ORDER — OZEMPIC (1 MG/DOSE) 4 MG/3ML ~~LOC~~ SOPN: 1.0000 mg | PEN_INJECTOR | SUBCUTANEOUS | 0 refills | Status: DC

## 2021-01-15 NOTE — Progress Notes (Signed)
Chief Complaint:   OBESITY Kimberly Klein is here to discuss her progress with her obesity treatment plan along with follow-up of her obesity related diagnoses. See Medical Weight Management Flowsheet for complete bioelectrical impedance results.  Today's visit was #: 9 Starting weight: 179 lbs Starting date: 06/28/2020 Weight change since last visit: +2 lbs Total lbs lost to date: 15 lbs Total weight loss percentage to date: -8.38%  Nutrition Plan: Keeping a food journal and adhering to recommended goals of 1100-1200 calories and 75 grams of protein daily for 50% of the time. Activity: Walking for 20-40 minutes 3-4 times per week. Anti-obesity medications: Ozempic 1 mg subcutaneously weekly. Reported side effects: None.  Interim History: Her goal is to lose 20 pounds by her birthday.    Assessment/Plan:   1. B12 deficiency Lab Results  Component Value Date   VITAMINB12 336 12/11/2020   Supplementation: None.   Plan:  Recommend B Complex vitamin.  2. Pure hypercholesterolemia Course: Not at goal. Lipid-lowering medications: None. She declines statins.  Plan:  We discussed [facility].  Dietary changes: Increase soluble fiber, decrease simple carbohydrates, decrease saturated fat. Exercise changes: Moderate to vigorous-intensity aerobic activity 150 minutes per week or as tolerated.   Lab Results  Component Value Date   CHOL 273 (H) 12/11/2020   HDL 66 12/11/2020   LDLCALC 186 (H) 12/11/2020   TRIG 121 12/11/2020   CHOLHDL 4.1 12/11/2020   Lab Results  Component Value Date   ALT 20 12/11/2020   AST 19 12/11/2020   ALKPHOS 82 12/11/2020   BILITOT 0.5 12/11/2020   The 10-year ASCVD risk score (Arnett DK, et al., 2019) is: 16.5%   Values used to calculate the score:     Age: 71 years     Sex: Female     Is Non-Hispanic African American: No     Diabetic: No     Tobacco smoker: No     Systolic Blood Pressure: 767 mmHg     Is BP treated: Yes     HDL  Cholesterol: 66 mg/dL     Total Cholesterol: 273 mg/dL  3. Prediabetes, with polyphagia At goal. Goal is HgbA1c < 5.7.  Medication: Ozempic 0.5 mg subcutaneously weekly.    Plan: She will continue to focus on protein-rich, low simple carbohydrate foods. We reviewed the importance of hydration, regular exercise for stress reduction, and restorative sleep.   Lab Results  Component Value Date   HGBA1C 5.5 12/11/2020   Lab Results  Component Value Date   INSULIN 9.1 06/28/2020   - Refill Semaglutide, 1 MG/DOSE, (OZEMPIC, 1 MG/DOSE,) 4 MG/3ML SOPN; Inject 1 mg into the skin once a week.  Dispense: 3 mL; Refill: 0  4. Vitamin D deficiency Not at goal. She is taking vitamin D 50,000 IU weekly.  Plan: Continue to take prescription Vitamin D @50 ,000 IU every week as prescribed.  Follow-up for routine testing of Vitamin D, at least 2-3 times per year to avoid over-replacement.  Lab Results  Component Value Date   VD25OH 34.7 12/11/2020   VD25OH 17.5 (L) 06/28/2020   5. Lack of motivation Discussed getting to the gym.  6. Obesity, current BMI 30.1  Course: Kimberly Klein is currently in the action stage of change. As such, her goal is to continue with weight loss efforts.   Nutrition goals: She has agreed to keeping a food journal and adhering to recommended goals of 1100-1200 calories and 75 grams of protein.   Exercise  goals:  Goal is to restart at the gym.  Behavioral modification strategies: increasing lean protein intake, decreasing simple carbohydrates, increasing vegetables, and increasing water intake.  Kimberly Klein has agreed to follow-up with our clinic in 4 weeks. She was informed of the importance of frequent follow-up visits to maximize her success with intensive lifestyle modifications for her multiple health conditions.   Objective:   Blood pressure 129/81, pulse 84, temperature 97.6 F (36.4 C), temperature source Oral, height 5\' 2"  (1.575 m), weight 164 lb (74.4 kg), SpO2 94  %. Body mass index is 30 kg/m.  General: Cooperative, alert, well developed, in no acute distress. HEENT: Conjunctivae and lids unremarkable. Cardiovascular: Regular rhythm.  Lungs: Normal work of breathing. Neurologic: No focal deficits.   Lab Results  Component Value Date   CREATININE 0.84 12/11/2020   BUN 10 12/11/2020   NA 139 12/11/2020   K 4.5 12/11/2020   CL 99 12/11/2020   CO2 24 12/11/2020   Lab Results  Component Value Date   ALT 20 12/11/2020   AST 19 12/11/2020   ALKPHOS 82 12/11/2020   BILITOT 0.5 12/11/2020   Lab Results  Component Value Date   HGBA1C 5.5 12/11/2020   HGBA1C 5.7 (H) 06/28/2020   HGBA1C 5.3 09/27/2018   HGBA1C 4.9 05/30/2016   Lab Results  Component Value Date   INSULIN 9.1 06/28/2020   Lab Results  Component Value Date   TSH 0.957 12/31/2020   Lab Results  Component Value Date   CHOL 273 (H) 12/11/2020   HDL 66 12/11/2020   LDLCALC 186 (H) 12/11/2020   TRIG 121 12/11/2020   CHOLHDL 4.1 12/11/2020   Lab Results  Component Value Date   VD25OH 34.7 12/11/2020   VD25OH 17.5 (L) 06/28/2020   Lab Results  Component Value Date   WBC 5.8 12/11/2020   HGB 14.7 12/11/2020   HCT 45.1 12/11/2020   MCV 94 12/11/2020   PLT 256 12/11/2020   Lab Results  Component Value Date   IRON 105 06/28/2020   TIBC 312 06/28/2020   FERRITIN 244 (H) 06/28/2020   Attestation Statements:   Reviewed by clinician on day of visit: allergies, medications, problem list, medical history, surgical history, family history, social history, and previous encounter notes.  I, Water quality scientist, CMA, am acting as transcriptionist for Briscoe Deutscher, DO  I have reviewed the above documentation for accuracy and completeness, and I agree with the above. -  Briscoe Deutscher, DO, MS, FAAFP, DABOM - Family and Bariatric Medicine.

## 2021-01-21 ENCOUNTER — Encounter: Payer: Self-pay | Admitting: Adult Health

## 2021-01-21 ENCOUNTER — Inpatient Hospital Stay: Payer: PPO | Attending: Adult Health | Admitting: Adult Health

## 2021-01-21 ENCOUNTER — Other Ambulatory Visit: Payer: Self-pay

## 2021-01-21 VITALS — BP 118/71 | HR 83 | Temp 97.9°F | Resp 18 | Wt 169.0 lb

## 2021-01-21 DIAGNOSIS — D0511 Intraductal carcinoma in situ of right breast: Secondary | ICD-10-CM | POA: Diagnosis not present

## 2021-01-21 DIAGNOSIS — Z807 Family history of other malignant neoplasms of lymphoid, hematopoietic and related tissues: Secondary | ICD-10-CM | POA: Insufficient documentation

## 2021-01-21 DIAGNOSIS — Z808 Family history of malignant neoplasm of other organs or systems: Secondary | ICD-10-CM | POA: Diagnosis not present

## 2021-01-21 DIAGNOSIS — Z87891 Personal history of nicotine dependence: Secondary | ICD-10-CM | POA: Insufficient documentation

## 2021-01-21 DIAGNOSIS — Z8052 Family history of malignant neoplasm of bladder: Secondary | ICD-10-CM | POA: Insufficient documentation

## 2021-01-21 DIAGNOSIS — I10 Essential (primary) hypertension: Secondary | ICD-10-CM | POA: Insufficient documentation

## 2021-01-21 NOTE — Progress Notes (Signed)
SURVIVORSHIP VISIT:    BRIEF ONCOLOGIC HISTORY:  Oncology History  Ductal carcinoma in situ (DCIS) of right breast  03/28/2020 Initial Diagnosis   [name] status post right breast biopsy x2 on 03/28/2020 for ductal carcinoma in situ measuring approximately 5 cm, grade 2 or 3, estrogen receptor weakly positive at 20%, progesterone receptor negative   04/04/2020 Cancer Staging   Staging form: Breast, AJCC 8th Edition - Clinical stage from 04/04/2020: Stage 0 (cTis (DCIS), cN0, cM0, ER+, PR-) - Signed by Chauncey Cruel, MD on 04/04/2020 Stage prefix: Initial diagnosis Nuclear grade: GX Laterality: Right Staged by: Pathologist and managing physician Stage used in treatment planning: Yes National guidelines used in treatment planning: Yes Type of national guideline used in treatment planning: NCCN    05/02/2020 Surgery   status post right lumpectomy 05/02/2020 for a 6.7 cm high-grade ductal carcinoma in situ, with focally positive margins.             (a) additional surgery 07/11/2020 obtained at negative margins.   08/20/2020 - 10/04/2020 Radiation Therapy   08/20/2020 through 10/04/2020 Site Technique Total Dose (Gy) Dose per Fx (Gy) Completed Fx Beam Energies  Breast, Right: Breast_Rt 3D 50.4/50.4 1.8 28/28 6X, 10X  Breast, Right: Breast_Rt_Bst 3D 10/10 2 5/5 6X, 10X    11/29/2020 -  Anti-estrogen oral therapy   Anastrozole daily   01/18/2021 Cancer Staging   Staging form: Breast, AJCC 8th Edition - Pathologic: Stage 0 (pTis (DCIS), pN0, cM0, ER+, PR-) - Signed by Gardenia Phlegm, NP on 01/18/2021      INTERVAL HISTORY:  Kimberly Klein to review her survivorship care plan detailing her treatment course for breast cancer, as well as monitoring long-term side effects of that treatment, education regarding health maintenance, screening, and overall wellness and health promotion.     Overall, Kimberly Klein reports feeling quite well.  She has not yet started Anastrozole.  She  is concerned about potential side effects and is uncertain if she wants to begin therapy considering the fact that her breast cancer is non invasive.  Overall she is feeling quite good.  She denies any new issues.  REVIEW OF SYSTEMS:  Review of Systems  Constitutional:  Negative for appetite change, chills, fatigue, fever and unexpected weight change.  HENT:   Negative for hearing loss, lump/mass and trouble swallowing.   Eyes:  Negative for eye problems and icterus.  Respiratory:  Negative for chest tightness, cough and shortness of breath.   Cardiovascular:  Negative for chest pain, leg swelling and palpitations.  Gastrointestinal:  Negative for abdominal distention, abdominal pain, constipation, diarrhea, nausea and vomiting.  Endocrine: Negative for hot flashes.  Genitourinary:  Negative for difficulty urinating.   Musculoskeletal:  Negative for arthralgias.  Skin:  Negative for itching and rash.  Neurological:  Negative for dizziness, extremity weakness, headaches and numbness.  Hematological:  Negative for adenopathy. Does not bruise/bleed easily.  Psychiatric/Behavioral:  Negative for depression. The patient is not nervous/anxious.   Breast: Denies any new nodularity, masses, tenderness, nipple changes, or nipple discharge.      ONCOLOGY TREATMENT TEAM:  1. Surgeon:  Dr. Barry Dienes at [facility] 2. Medical Oncologist: Dr. Jana Hakim  3. Radiation Oncologist: Dr. Sondra Come    PAST MEDICAL/SURGICAL HISTORY:  Past Medical History:  Diagnosis Date   Back pain    Breast cancer ([place]) 03/2020   Dysrhythmia    SVT at times   History of radiation therapy    Right breast -08/20/20-10/04/20- Dr. Jeneen Rinks  [place]   Hx of cold sores    Hypertension 2019   Joint pain    Macular degeneration    Palpitations    Sciatica    SVT (supraventricular tachycardia) ([place]) 07/2013   strees test- normal   Past Surgical History:  Procedure Laterality Date   appendectomy     APPENDECTOMY   1921   AUGMENTATION MAMMAPLASTY Bilateral    BREAST LUMPECTOMY WITH RADIOACTIVE SEED LOCALIZATION Right 05/02/2020   Procedure: RIGHT BREAST LUMPECTOMY WITH RADIOACTIVE SEED LOCALIZATION X 2;  Surgeon: [name], MD;  Location: [place];  Service: General;  Laterality: Right;   EYE SURGERY Bilateral 2022   cataract surgery   MASTOPEXY Bilateral 07/11/2020   Procedure: BILATERAL MASTOPEXY AND REMOVAL OF IMPLANTS WITH COMPLETE CAPSULECTOMY;  Surgeon: Wallace Going, DO;  Location: [place];  Service: Plastics;  Laterality: Bilateral;   RE-EXCISION OF BREAST LUMPECTOMY Right 07/11/2020   Procedure: RE-EXCISION OF RIGHT BREAST LUMPECTOMY;  Surgeon: [name], MD;  Location: [place];  Service: General;  Laterality: Right;     ALLERGIES:  No Known Allergies   CURRENT MEDICATIONS:  Outpatient Encounter Medications as of 01/21/2021  Medication Sig   amLODipine (NORVASC) 5 MG tablet Take 1 tablet (5 mg total) by mouth daily. TAKE 1 TABLET(5 MG) BY MOUTH DAILY   anastrozole (ARIMIDEX) 1 MG tablet Take 1 tablet (1 mg total) by mouth daily.   lisinopril (ZESTRIL) 20 MG tablet TAKE 1 TABLET(20 MG) BY MOUTH DAILY   Multiple Vitamins-Minerals (PRESERVISION AREDS 2+MULTI VIT PO) Take 1 capsule by mouth in the morning and at bedtime.   Semaglutide, 1 MG/DOSE, (OZEMPIC, 1 MG/DOSE,) 4 MG/3ML SOPN Inject 1 mg into the skin once a week.   valACYclovir (VALTREX) 1000 MG tablet Take 1 tablet (1,000 mg total) by mouth 2 (two) times daily.   Vitamin D, Ergocalciferol, (DRISDOL) 1.25 MG (50000 UNIT) CAPS capsule Take 1 capsule (50,000 Units total) by mouth every 7 (seven) days.   No facility-administered encounter medications on file as of 01/21/2021.     ONCOLOGIC FAMILY HISTORY:  Family History  Problem Relation Age of Onset   Depression Mother    Obesity Mother    Hypertension Father    Hyperlipidemia Father    Heart attack Father    Stroke Father    Heart disease Father     Liver cancer Sister    Bladder Cancer Brother    Multiple myeloma Brother      GENETIC COUNSELING/TESTING: Not at this time   SOCIAL HISTORY:  Social History   Socioeconomic History   Marital status: Widowed    Spouse name: Not on file   Number of children: Not on file   Years of education: Not on file   Highest education level: Not on file  Occupational History   Occupation: PRN RN   Tobacco Use   Smoking status: Former    Packs/day: 0.00    Years: 20.00    Pack years: 0.00    Types: Cigarettes    Quit date: 10/03/2007    Years since quitting: 13.3   Smokeless tobacco: Never  Vaping Use   Vaping Use: Never used  Substance and Sexual Activity   Alcohol use: Yes    Alcohol/week: 4.0 - 5.0 standard drinks    Types: 4 - 5 Glasses of wine per week    Comment: occasional   Drug use: No   Sexual activity: Not Currently    Birth control/protection: Post-menopausal  Other Topics Concern   Not on file  Social History Narrative   Not on file   Social Determinants of Health   Financial Resource Strain: Low Risk    Difficulty of Paying Living Expenses: Not hard at all  Food Insecurity: No Food Insecurity   Worried About Charity fundraiser in the Last Year: Never true   Smoke Rise in the Last Year: Never true  Transportation Needs: No Transportation Needs   Lack of Transportation (Medical): No   Lack of Transportation (Non-Medical): No  Physical Activity: Sufficiently Active   Days of Exercise per Week: 4 days   Minutes of Exercise per Session: 40 min  Stress: No Stress Concern Present   Feeling of Stress : Not at all  Social Connections: Not on file  Intimate Partner Violence: Not on file     OBSERVATIONS/OBJECTIVE:  BP 118/71 (BP Location: Left Arm, Patient Position: Sitting)    Pulse 83    Temp 97.9 F (36.6 C) (Temporal)    Resp 18    Wt 169 lb (76.7 kg)    SpO2 98%    BMI 30.91 kg/m  GENERAL: Patient is a well appearing female in no acute  distress HEENT:  Sclerae anicteric.  Oropharynx clear and moist. No ulcerations or evidence of oropharyngeal candidiasis. Neck is supple.  NODES:  No cervical, supraclavicular, or axillary lymphadenopathy palpated.  BREAST EXAM: Right breast status postlumpectomy and radiation no sign of local recurrence left breast is benign. LUNGS:  Clear to auscultation bilaterally.  No wheezes or rhonchi. HEART:  Regular rate and rhythm. No murmur appreciated. ABDOMEN:  Soft, nontender.  Positive, normoactive bowel sounds. No organomegaly palpated. MSK:  No focal spinal tenderness to palpation. Full range of motion bilaterally in the upper extremities. EXTREMITIES:  No peripheral edema.   SKIN:  Clear with no obvious rashes or skin changes. No nail dyscrasia. NEURO:  Nonfocal. Well oriented.  Appropriate affect.  LABORATORY DATA:  None for this visit.  DIAGNOSTIC IMAGING:  None for this visit.      ASSESSMENT AND PLAN:  Ms.. Kayes is a pleasant 71 y.o. 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  She presents to the Survivorship Clinic for our initial meeting and routine follow-up post-completion of treatment for breast cancer.    1. Stage 0 right breast cancer:  Kimberly Klein is continuing to recover from definitive treatment for breast cancer. She will follow-up with her medical oncologist, Dr. Chryl Heck in 6 months with history and physical exam per surveillance protocol.  She has opted to forego taking anastrozole.  She is going to continue to think about this.  She is concerned about the soft effects considering her breast cancer was not invasive.. Her mammogram is due 02/2021; orders placed today.   Today, a comprehensive survivorship care plan and treatment summary was reviewed with the patient today detailing  her breast cancer diagnosis, treatment course, potential late/long-term effects of treatment, appropriate follow-up care with recommendations for the future, and patient education resources.  A copy of this summary, along with a letter will be sent to the patients primary care provider via mail/fax/In Basket message after todays visit.    2. Bone health:  Given Ms. Adamik age/history of breast cancer and her current treatment regimen including anti-estrogen therapy with Anastrozole, she is at risk  for bone demineralization. She is scheduled for bone density testing on 06/05/2021.  In the meantime, she was encouraged to increase her consumption of foods rich in calcium, as well as increase her weight-bearing activities.  She was given education on specific activities to promote bone health.  3. Cancer screening:  Due to Ms. Pecor's history and her age, she should receive screening for skin cancers, colon cancer, and gynecologic cancers.  The information and recommendations are listed on the patient's comprehensive care plan/treatment summary and were reviewed in detail with the patient.    4. Health maintenance and wellness promotion: Ms. Brisby was encouraged to consume 5-7 servings of fruits and vegetables per day. We reviewed the "Nutrition Rainbow" handout.  She was also encouraged to engage in moderate to vigorous exercise for 30 minutes per day most days of the week. We discussed the [employer] fitness program, which is designed for cancer survivors to help them become more physically fit after cancer treatments.  She was instructed to limit her alcohol consumption and continue to abstain from tobacco use.     5. Support services/counseling: It is not uncommon for this period of the patient's cancer care trajectory to be one of many emotions and stressors.    She was given information regarding our available services and encouraged to contact me with any questions or for help enrolling in any  of our support group/programs.    Follow up instructions:    -Return to cancer center in 6 months  -Mammogram due in 02/2021 -Bone density 05/2021 -Follow up with surgery 1 year -She is welcome to return back to the Survivorship Clinic at any time; no additional follow-up needed at this time.  -Consider referral back to survivorship as a long-term survivor for continued surveillance  The patient was provided an opportunity to ask questions and all were answered. The patient agreed with the plan and demonstrated an understanding of the instructions.   Total encounter time: 45 minutes in face-to-face visit time, chart review, lab review, care coordination, and documentation of the encounter.  Wilber Bihari, NP 01/21/21 11:31 AM Medical Oncology and Hematology [facility], [place] [address] Tel. [phone]    Fax. [phone]  *Total Encounter Time as defined by the Centers for Medicare and Medicaid Services includes, in addition to the face-to-face time of a patient visit (documented in the note above) non-face-to-face time: obtaining and reviewing outside history, ordering and reviewing medications, tests or procedures, care coordination (communications with other health care professionals or caregivers) and documentation in the medical record.

## 2021-01-22 ENCOUNTER — Telehealth: Payer: Self-pay | Admitting: Adult Health

## 2021-01-22 NOTE — Telephone Encounter (Signed)
Scheduled appointment per 1/9 los. Unable to leave voicemail due to mailbox not being set up. Patient will be mailed updated calendar.

## 2021-02-12 ENCOUNTER — Ambulatory Visit (INDEPENDENT_AMBULATORY_CARE_PROVIDER_SITE_OTHER): Payer: PPO | Admitting: Family Medicine

## 2021-02-12 ENCOUNTER — Other Ambulatory Visit: Payer: Self-pay

## 2021-02-12 ENCOUNTER — Encounter (INDEPENDENT_AMBULATORY_CARE_PROVIDER_SITE_OTHER): Payer: Self-pay | Admitting: Family Medicine

## 2021-02-12 VITALS — BP 126/77 | HR 77 | Temp 97.5°F | Ht 62.0 in | Wt 159.0 lb

## 2021-02-12 DIAGNOSIS — I10 Essential (primary) hypertension: Secondary | ICD-10-CM | POA: Diagnosis not present

## 2021-02-12 DIAGNOSIS — Z6829 Body mass index (BMI) 29.0-29.9, adult: Secondary | ICD-10-CM

## 2021-02-12 DIAGNOSIS — E669 Obesity, unspecified: Secondary | ICD-10-CM

## 2021-02-12 DIAGNOSIS — R7303 Prediabetes: Secondary | ICD-10-CM

## 2021-02-12 DIAGNOSIS — Z6832 Body mass index (BMI) 32.0-32.9, adult: Secondary | ICD-10-CM

## 2021-02-12 MED ORDER — OZEMPIC (1 MG/DOSE) 4 MG/3ML ~~LOC~~ SOPN: 1.0000 mg | PEN_INJECTOR | SUBCUTANEOUS | 0 refills | Status: DC

## 2021-02-12 NOTE — Progress Notes (Signed)
Chief Complaint:   OBESITY Kimberly Klein is here to discuss her progress with her obesity treatment plan along with follow-up of her obesity related diagnoses. Kimberly Klein is on keeping a food journal and adhering to recommended goals of 1100-1200 calories and 75 grams of protein and states she is following her eating plan approximately 98% of the time. Kimberly Klein states she is walking for 40 minutes 5 times per week.  Today's visit was #: 10 Starting weight: 179 lbs Starting date: 06/28/2020 Today's weight: 159 lbs Today's date: 02/12/2021 Total lbs lost to date: 20 lbs Total lbs lost since last in-office visit: 5 lbs  Interim History: Kimberly Klein went to [place] once a few weeks ago and she loved it. She is journaling daily and generally gets 60 grams per day. She keeps calories at 1200 per day or less. Her weight goal is 130 lbs (23 BMI).   Subjective:   1. Prediabetes, with polyphagia Cinzia has mild nausea occasionally. She is on Ozempic 1 mg. She denies constipation. She feels the Ozempic is quite beneficial for reducing her appetite.  Lab Results  Component Value Date   HGBA1C 5.5 12/11/2020   Lab Results  Component Value Date   INSULIN 9.1 06/28/2020    2. Essential hypertension Kimberly Klein's blood pressure is well-controlled. She has not been taking her blood pressure medications. She is prescribed Norvasc and Lisinopril. She states it usually run 120's/70's at home.  BP Readings from Last 3 Encounters:  02/12/21 126/77  01/21/21 118/71  01/15/21 129/81    Assessment/Plan:   1. Prediabetes, with polyphagia We will refill Ozempic 1.  - Semaglutide, 1 MG/DOSE, (OZEMPIC, 1 MG/DOSE,) 4 MG/3ML SOPN; Inject 1 mg into the skin once a week.  Dispense: 3 mL; Refill: 0  2. Essential hypertension We will continue to hold medications for now. Lorian will continue to check her blood pressure at home.  3. Obesity, current BMI 29.07 Kimberly Klein is currently in the action stage of change.  As such, her goal is to continue with weight loss efforts. She has agreed to keeping a food journal and adhering to recommended goals of 1100-1200 calories and 75 grams of protein daily.   I encouraged Kimberly Klein to focus on increasing her protein to 75 grams per day. We discussed 130 lbs is a bit low for her weight goal. I recommended 140 lbs (25 BMI).  Exercise goals:  Kimberly Klein will add yoga a few days per week. We discussed the importance of building muscle.  Behavioral modification strategies: meal planning and cooking strategies and better snacking choices.  Kimberly Klein has agreed to follow-up with our clinic in 4 weeks with Dr. Juleen China.  Objective:   Blood pressure 126/77, pulse 77, temperature (!) 97.5 F (36.4 C), height 5\' 2"  (1.575 m), weight 159 lb (72.1 kg), SpO2 98 %. Body mass index is 29.08 kg/m.  General: Cooperative, alert, well developed, in no acute distress. HEENT: Conjunctivae and lids unremarkable. Cardiovascular: Regular rhythm.  Lungs: Normal work of breathing. Neurologic: No focal deficits.   Lab Results  Component Value Date   CREATININE 0.84 12/11/2020   BUN 10 12/11/2020   NA 139 12/11/2020   K 4.5 12/11/2020   CL 99 12/11/2020   CO2 24 12/11/2020   Lab Results  Component Value Date   ALT 20 12/11/2020   AST 19 12/11/2020   ALKPHOS 82 12/11/2020   BILITOT 0.5 12/11/2020   Lab Results  Component Value Date   HGBA1C 5.5 12/11/2020  HGBA1C 5.7 (H) 06/28/2020   HGBA1C 5.3 09/27/2018   HGBA1C 4.9 05/30/2016   Lab Results  Component Value Date   INSULIN 9.1 06/28/2020   Lab Results  Component Value Date   TSH 0.957 12/31/2020   Lab Results  Component Value Date   CHOL 273 (H) 12/11/2020   HDL 66 12/11/2020   LDLCALC 186 (H) 12/11/2020   TRIG 121 12/11/2020   CHOLHDL 4.1 12/11/2020   Lab Results  Component Value Date   VD25OH 34.7 12/11/2020   VD25OH 17.5 (L) 06/28/2020   Lab Results  Component Value Date   WBC 5.8 12/11/2020   HGB  14.7 12/11/2020   HCT 45.1 12/11/2020   MCV 94 12/11/2020   PLT 256 12/11/2020   Lab Results  Component Value Date   IRON 105 06/28/2020   TIBC 312 06/28/2020   FERRITIN 244 (H) 06/28/2020   Attestation Statements:   Reviewed by clinician on day of visit: allergies, medications, problem list, medical history, surgical history, family history, social history, and previous encounter notes.  I, Lizbeth Bark, RMA, am acting as Location manager for [employer], [place].  I have reviewed the above documentation for accuracy and completeness, and I agree with the above. -  Georgianne Fick, FNP

## 2021-02-19 ENCOUNTER — Encounter (INDEPENDENT_AMBULATORY_CARE_PROVIDER_SITE_OTHER): Payer: Self-pay | Admitting: Family Medicine

## 2021-02-19 ENCOUNTER — Other Ambulatory Visit: Payer: Self-pay

## 2021-02-19 DIAGNOSIS — R7303 Prediabetes: Secondary | ICD-10-CM

## 2021-02-19 MED ORDER — OZEMPIC (1 MG/DOSE) 4 MG/3ML ~~LOC~~ SOPN
1.0000 mg | PEN_INJECTOR | SUBCUTANEOUS | 0 refills | Status: DC
  Filled 2021-02-19: qty 3, 28d supply, fill #0

## 2021-02-19 NOTE — Telephone Encounter (Signed)
Dawn 

## 2021-03-12 ENCOUNTER — Ambulatory Visit (INDEPENDENT_AMBULATORY_CARE_PROVIDER_SITE_OTHER): Payer: PPO | Admitting: Family Medicine

## 2021-03-12 ENCOUNTER — Other Ambulatory Visit: Payer: Self-pay

## 2021-03-12 ENCOUNTER — Encounter (INDEPENDENT_AMBULATORY_CARE_PROVIDER_SITE_OTHER): Payer: Self-pay | Admitting: Family Medicine

## 2021-03-12 VITALS — BP 125/73 | HR 78 | Temp 97.7°F | Ht 62.0 in | Wt 156.0 lb

## 2021-03-12 DIAGNOSIS — R7303 Prediabetes: Secondary | ICD-10-CM

## 2021-03-12 DIAGNOSIS — Z6828 Body mass index (BMI) 28.0-28.9, adult: Secondary | ICD-10-CM | POA: Diagnosis not present

## 2021-03-12 DIAGNOSIS — E669 Obesity, unspecified: Secondary | ICD-10-CM

## 2021-03-12 DIAGNOSIS — E7800 Pure hypercholesterolemia, unspecified: Secondary | ICD-10-CM | POA: Diagnosis not present

## 2021-03-12 DIAGNOSIS — I10 Essential (primary) hypertension: Secondary | ICD-10-CM | POA: Diagnosis not present

## 2021-03-14 ENCOUNTER — Other Ambulatory Visit: Payer: Self-pay

## 2021-03-14 MED ORDER — OZEMPIC (1 MG/DOSE) 4 MG/3ML ~~LOC~~ SOPN
1.0000 mg | PEN_INJECTOR | SUBCUTANEOUS | 1 refills | Status: DC
  Filled 2021-03-14: qty 3, 28d supply, fill #0

## 2021-03-18 NOTE — Progress Notes (Signed)
Chief Complaint:   OBESITY Kimberly Klein is here to discuss her progress with her obesity treatment plan along with follow-up of her obesity related diagnoses.   Today's visit was #: 11 Starting weight: 179 lbs Starting date: 06/28/2020 Today's weight: 156 lbs Today's date: 03/12/2021 Weight change since last visit: -3 lbs Total lbs lost to date: 23 lbs Body mass index is 28.53 kg/m.  Total weight loss percentage to date: -12.85%  Current Meal Plan: keeping a food journal and adhering to recommended goals of 1100-1200 calories and 75 grams of protein for 98% of the time.  Current Exercise Plan: Walking and yoga for 40-60 minutes 4 times per week. Current Anti-Obesity Medications: Ozempic 1 MG weekly. Side effects: None.  Interim History: Kimberly Klein is doing yoga weekly. She is in a neighborhood walking group. She is fitting into medium scrubs. Her goal BMI is 23-24 (recent weight 5 years ago and felt very healthy).  Assessment/Plan:   1. Prediabetes, with polyphagia At goal. Goal is HgbA1c < 5.7.  Medication: Ozempic 1 MG weekly.    Plan:  She will continue to focus on protein-rich, low simple carbohydrate foods. We reviewed the importance of hydration, regular exercise for stress reduction, and restorative sleep. We will refill Ozempic, as per below.  Lab Results  Component Value Date   HGBA1C 5.5 12/11/2020   Lab Results  Component Value Date   INSULIN 9.1 06/28/2020   - Refill Semaglutide, 1 MG/DOSE, (OZEMPIC, 1 MG/DOSE,) 4 MG/3ML SOPN; Inject 1 mg into the skin once a week.  Dispense: 3 mL; Refill: 1  2. Essential hypertension At goal. Medications: Norvasc and Lisinopril.   Plan: Avoid buying foods that are: processed, frozen, or prepackaged to avoid excess salt. We will watch for signs of hypotension as she continues lifestyle modifications.  BP Readings from Last 3 Encounters:  03/12/21 125/73  02/12/21 126/77  01/21/21 118/71   Lab Results  Component Value Date    CREATININE 0.84 12/11/2020   3. Pure hypercholesterolemia Course: Not at goal. Lipid-lowering medications: None.   Plan: Dietary changes: Increase soluble fiber, decrease simple carbohydrates, decrease saturated fat. Exercise changes: Moderate to vigorous-intensity aerobic activity 150 minutes per week or as tolerated. We will continue to monitor along with PCP/specialists as it pertains to her weight loss journey.  Lab Results  Component Value Date   CHOL 273 (H) 12/11/2020   HDL 66 12/11/2020   LDLCALC 186 (H) 12/11/2020   TRIG 121 12/11/2020   CHOLHDL 4.1 12/11/2020   Lab Results  Component Value Date   ALT 20 12/11/2020   AST 19 12/11/2020   ALKPHOS 82 12/11/2020   BILITOT 0.5 12/11/2020   The 10-year ASCVD risk score (Arnett DK, et al., 2019) is: 15.5%   Values used to calculate the score:     Age: 71 years     Sex: Female     Is Non-Hispanic African American: No     Diabetic: No     Tobacco smoker: No     Systolic Blood Pressure: 076 mmHg     Is BP treated: Yes     HDL Cholesterol: 66 mg/dL     Total Cholesterol: 273 mg/dL  4. Obesity, current BMI 28.7 Course: Kimberly Klein is currently in the action stage of change. As such, her goal is to continue with weight loss efforts.   Nutrition goals: She has agreed to keeping a food journal and adhering to recommended goals of 1100-1200 calories and 75 grams of  protein.   Exercise goals: As is.  Behavioral modification strategies: increasing lean protein intake, decreasing simple carbohydrates, and increasing vegetables.  [name] has agreed to follow-up with our clinic in 4 weeks. She was informed of the importance of frequent follow-up visits to maximize her success with intensive lifestyle modifications for her multiple health conditions.   Objective:   Blood pressure 125/73, pulse 78, temperature 97.7 F (36.5 C), temperature source Oral, height '5\' 2"'$  (1.575 m), weight 156 lb (70.8 kg), SpO2 94 %. Body mass index is  28.53 kg/m.  General: Cooperative, alert, well developed, in no acute distress. HEENT: Conjunctivae and lids unremarkable. Cardiovascular: Regular rhythm.  Lungs: Normal work of breathing. Neurologic: No focal deficits.   Lab Results  Component Value Date   CREATININE 0.84 12/11/2020   BUN 10 12/11/2020   NA 139 12/11/2020   K 4.5 12/11/2020   CL 99 12/11/2020   CO2 24 12/11/2020   Lab Results  Component Value Date   ALT 20 12/11/2020   AST 19 12/11/2020   ALKPHOS 82 12/11/2020   BILITOT 0.5 12/11/2020   Lab Results  Component Value Date   HGBA1C 5.5 12/11/2020   HGBA1C 5.7 (H) 06/28/2020   HGBA1C 5.3 09/27/2018   HGBA1C 4.9 05/30/2016   Lab Results  Component Value Date   INSULIN 9.1 06/28/2020   Lab Results  Component Value Date   TSH 0.957 12/31/2020   Lab Results  Component Value Date   CHOL 273 (H) 12/11/2020   HDL 66 12/11/2020   LDLCALC 186 (H) 12/11/2020   TRIG 121 12/11/2020   CHOLHDL 4.1 12/11/2020   Lab Results  Component Value Date   VD25OH 34.7 12/11/2020   VD25OH 17.5 (L) 06/28/2020   Lab Results  Component Value Date   WBC 5.8 12/11/2020   HGB 14.7 12/11/2020   HCT 45.1 12/11/2020   MCV 94 12/11/2020   PLT 256 12/11/2020   Lab Results  Component Value Date   IRON 105 06/28/2020   TIBC 312 06/28/2020   FERRITIN 244 (H) 06/28/2020   Attestation Statements:   Reviewed by clinician on day of visit: allergies, medications, problem list, medical history, surgical history, family history, social history, and previous encounter notes.  Leodis Binet Friedenbach, CMA, am acting as Location manager for [employer], DO.  I have reviewed the above documentation for accuracy and completeness, and I agree with the above. -  Briscoe Deutscher, DO, MS, FAAFP, DABOM - Family and Bariatric Medicine.

## 2021-03-21 ENCOUNTER — Ambulatory Visit
Admission: RE | Admit: 2021-03-21 | Discharge: 2021-03-21 | Disposition: A | Discharge status: Home / Self Care | Payer: PPO | Source: Ambulatory Visit | Attending: Adult Health | Admitting: Adult Health

## 2021-03-21 DIAGNOSIS — R922 Inconclusive mammogram: Secondary | ICD-10-CM | POA: Diagnosis not present

## 2021-03-21 DIAGNOSIS — D0511 Intraductal carcinoma in situ of right breast: Secondary | ICD-10-CM

## 2021-03-21 HISTORY — DX: Personal history of irradiation: Z92.3

## 2021-04-09 ENCOUNTER — Other Ambulatory Visit: Payer: Self-pay

## 2021-04-09 ENCOUNTER — Ambulatory Visit (INDEPENDENT_AMBULATORY_CARE_PROVIDER_SITE_OTHER): Payer: PPO | Admitting: Physician Assistant

## 2021-04-09 ENCOUNTER — Telehealth (INDEPENDENT_AMBULATORY_CARE_PROVIDER_SITE_OTHER): Payer: PPO | Admitting: Family Medicine

## 2021-04-09 ENCOUNTER — Encounter (INDEPENDENT_AMBULATORY_CARE_PROVIDER_SITE_OTHER): Payer: Self-pay | Admitting: Physician Assistant

## 2021-04-09 VITALS — BP 120/78 | HR 75 | Temp 97.4°F | Ht 62.0 in | Wt 157.0 lb

## 2021-04-09 DIAGNOSIS — Z6828 Body mass index (BMI) 28.0-28.9, adult: Secondary | ICD-10-CM

## 2021-04-09 DIAGNOSIS — E669 Obesity, unspecified: Secondary | ICD-10-CM | POA: Diagnosis not present

## 2021-04-09 DIAGNOSIS — R7303 Prediabetes: Secondary | ICD-10-CM | POA: Diagnosis not present

## 2021-04-09 DIAGNOSIS — E559 Vitamin D deficiency, unspecified: Secondary | ICD-10-CM | POA: Diagnosis not present

## 2021-04-09 MED ORDER — OZEMPIC (0.25 OR 0.5 MG/DOSE) 2 MG/1.5ML ~~LOC~~ SOPN
0.5000 mg | PEN_INJECTOR | SUBCUTANEOUS | 0 refills | Status: DC
  Filled 2021-04-09: qty 1.5, 28d supply, fill #0

## 2021-04-09 MED ORDER — VITAMIN D (ERGOCALCIFEROL) 1.25 MG (50000 UNIT) PO CAPS
50000.0000 [IU] | ORAL_CAPSULE | ORAL | 0 refills | Status: DC
  Filled 2021-04-09: qty 12, 84d supply, fill #0

## 2021-04-09 NOTE — Progress Notes (Signed)
? ? ? ?Chief Complaint:  ? ?OBESITY ?Kimberly Klein is here to discuss her progress with her obesity treatment plan along with follow-up of her obesity related diagnoses. Kimberly Klein is on keeping a food journal and adhering to recommended goals of 1100-1200 calories and 75 grams of protein and states she is following her eating plan approximately 70% of the time. Kimberly Klein states she is walking and yoga for 60 minutes 4 times per week. ? ?Today's visit was #: 12 ?Starting weight: 179 lbs ?Starting date: 06/28/2020 ?Today's weight: 157 lbs ?Today's date: 04/09/2021 ?Total lbs lost to date: 22 lbs ?Total lbs lost since last in-office visit: 0 ? ?Interim History: Kimberly Klein doesn't eat a lot of meat and therefore drinks supplements. She eats yogurt and oatmeal for breakfast and Weight Watchers meal for lunch, 2 protein shakes for dinner which totals around 860 calories. She is not hungry and sometimes nauseous with Ozempic.  ? ?Subjective:  ? ?1. Prediabetes, with polyphagia ?Kimberly Klein's last A1C was 5.5. She feels and often undereats during the day. She reports some nausea.  ? ?2. Vitamin D deficiency ?Kimberly Klein's last Vitamin D level was 34.7. ? ?Assessment/Plan:  ? ?1. Prediabetes, with polyphagia ?Kimberly Klein agrees to decrease Ozempic 0.5 mg for 1 month with no refills She will continue to work on weight loss, exercise, and decreasing simple carbohydrates to help decrease the risk of diabetes.  ? ?- Semaglutide,0.25 or 0.'5MG'$ /DOS, (OZEMPIC, 0.25 OR 0.5 MG/DOSE,) 2 MG/1.5ML SOPN; Inject 0.5 mg into the skin once a week.  Dispense: 1.5 mL; Refill: 0 ? ?2. Vitamin D deficiency ?Low Vitamin D level contributes to fatigue and are associated with obesity, breast, and colon cancer. We will refill prescription Vitamin D 50,000 IU every week for 1 month with no refills and Kimberly Klein will follow-up for routine testing of Vitamin D, at least 2-3 times per year to avoid over-replacement. ? ?- Vitamin D, Ergocalciferol, (DRISDOL) 1.25 MG (50000 UNIT)  CAPS capsule; Take 1 capsule (50,000 Units total) by mouth every 7 (seven) days.  Dispense: 12 capsule; Refill: 0 ? ?3. Obesity, current BMI 28.71 ?Kimberly Klein is currently in the action stage of change. As such, her goal is to continue with weight loss efforts. She has agreed to keeping a food journal and adhering to recommended goals of 1100-1200 calories and 75 grams of protein daily.  ? ?Exercise goals:  As is.  ? ?Behavioral modification strategies: meal planning and cooking strategies and keeping healthy foods in the home. ? ?Kimberly Klein has agreed to follow-up with our clinic in 3 weeks. She was informed of the importance of frequent follow-up visits to maximize her success with intensive lifestyle modifications for her multiple health conditions.  ? ?Objective:  ? ?Blood pressure 120/78, pulse 75, temperature (!) 97.4 ?F (36.3 ?C), height '5\' 2"'$  (1.575 m), weight 157 lb (71.2 kg), SpO2 97 %. ?Body mass index is 28.72 kg/m?. ? ?General: Cooperative, alert, well developed, in no acute distress. ?HEENT: Conjunctivae and lids unremarkable. ?Cardiovascular: Regular rhythm.  ?Lungs: Normal work of breathing. ?Neurologic: No focal deficits.  ? ?Lab Results  ?Component Value Date  ? CREATININE 0.84 12/11/2020  ? BUN 10 12/11/2020  ? NA 139 12/11/2020  ? K 4.5 12/11/2020  ? CL 99 12/11/2020  ? CO2 24 12/11/2020  ? ?Lab Results  ?Component Value Date  ? ALT 20 12/11/2020  ? AST 19 12/11/2020  ? ALKPHOS 82 12/11/2020  ? BILITOT 0.5 12/11/2020  ? ?Lab Results  ?Component Value Date  ? HGBA1C 5.5  12/11/2020  ? HGBA1C 5.7 (H) 06/28/2020  ? HGBA1C 5.3 09/27/2018  ? HGBA1C 4.9 05/30/2016  ? ?Lab Results  ?Component Value Date  ? INSULIN 9.1 06/28/2020  ? ?Lab Results  ?Component Value Date  ? TSH 0.957 12/31/2020  ? ?Lab Results  ?Component Value Date  ? CHOL 273 (H) 12/11/2020  ? HDL 66 12/11/2020  ? LDLCALC 186 (H) 12/11/2020  ? TRIG 121 12/11/2020  ? CHOLHDL 4.1 12/11/2020  ? ?Lab Results  ?Component Value Date  ? VD25OH 34.7  12/11/2020  ? VD25OH 17.5 (L) 06/28/2020  ? ?Lab Results  ?Component Value Date  ? WBC 5.8 12/11/2020  ? HGB 14.7 12/11/2020  ? HCT 45.1 12/11/2020  ? MCV 94 12/11/2020  ? PLT 256 12/11/2020  ? ?Lab Results  ?Component Value Date  ? IRON 105 06/28/2020  ? TIBC 312 06/28/2020  ? FERRITIN 244 (H) 06/28/2020  ? ? ?Obesity Behavioral Intervention:  ? ?Approximately 15 minutes were spent on the discussion below. ? ?ASK: ?We discussed the diagnosis of obesity with Kimberly Klein today and Kimberly Klein agreed to give [place] permission to discuss obesity behavioral modification therapy today. ? ?ASSESS: ?Kimberly Klein has the diagnosis of obesity and her BMI today is 28.8. Kimberly Klein is in the action stage of change.  ? ?ADVISE: ?Kimberly Klein was educated on the multiple health risks of obesity as well as the benefit of weight loss to improve her health. She was advised of the need for long term treatment and the importance of lifestyle modifications to improve her current health and to decrease her risk of future health problems. ? ?AGREE: ?Multiple dietary modification options and treatment options were discussed and Kimberly Klein agreed to follow the recommendations documented in the above note. ? ?ARRANGE: ?Kimberly Klein was educated on the importance of frequent visits to treat obesity as outlined per CMS and USPSTF guidelines and agreed to schedule her next follow up appointment today. ? ?Attestation Statements:  ? ?Reviewed by clinician on day of visit: allergies, medications, problem list, medical history, surgical history, family history, social history, and previous encounter notes. ? ?I, Kimberly Klein, am acting as Location manager for [employer], PA-C. ? ?I have reviewed the above documentation for accuracy and completeness, and I agree with the above. Kimberly Potash, PA-C ? ?

## 2021-05-07 ENCOUNTER — Ambulatory Visit (INDEPENDENT_AMBULATORY_CARE_PROVIDER_SITE_OTHER): Payer: PPO | Admitting: Physician Assistant

## 2021-05-27 ENCOUNTER — Encounter (INDEPENDENT_AMBULATORY_CARE_PROVIDER_SITE_OTHER): Payer: Self-pay | Admitting: Family Medicine

## 2021-05-27 ENCOUNTER — Other Ambulatory Visit: Payer: Self-pay

## 2021-05-27 ENCOUNTER — Ambulatory Visit (INDEPENDENT_AMBULATORY_CARE_PROVIDER_SITE_OTHER): Payer: PPO | Admitting: Family Medicine

## 2021-05-27 VITALS — BP 128/78 | HR 75 | Temp 98.0°F | Ht 62.0 in | Wt 159.0 lb

## 2021-05-27 DIAGNOSIS — I10 Essential (primary) hypertension: Secondary | ICD-10-CM | POA: Diagnosis not present

## 2021-05-27 DIAGNOSIS — R7303 Prediabetes: Secondary | ICD-10-CM | POA: Diagnosis not present

## 2021-05-27 DIAGNOSIS — Z6829 Body mass index (BMI) 29.0-29.9, adult: Secondary | ICD-10-CM | POA: Diagnosis not present

## 2021-05-27 DIAGNOSIS — E669 Obesity, unspecified: Secondary | ICD-10-CM

## 2021-05-27 MED ORDER — OZEMPIC (1 MG/DOSE) 4 MG/3ML ~~LOC~~ SOPN
1.0000 mg | PEN_INJECTOR | SUBCUTANEOUS | 0 refills | Status: DC
  Filled 2021-05-27: qty 3, 28d supply, fill #0

## 2021-05-28 ENCOUNTER — Other Ambulatory Visit: Payer: Self-pay

## 2021-05-29 NOTE — Progress Notes (Signed)
Chief Complaint:   OBESITY Kimberly Klein is here to discuss her progress with her obesity treatment plan along with follow-up of her obesity related diagnoses. Kimberly Klein is on keeping a food journal and adhering to recommended goals of 1100-1200 calories and 75 grams of protein and states she is following her eating plan approximately 40% of the time. Kimberly Klein states she is walking the dog for 40 minutes 4-5 times per week.  Today's visit was #: 49 Starting weight: 179 lbs Starting date: 06/28/2020 Today's weight: 159 lbs Today's date: 05/27/2021 Total lbs lost to date: 20 lbs Total lbs lost since last in-office visit: 0  Interim History: Kimberly Klein has had a really great experience at clinic so far. 60% of the time she has been doing indulgent eating with increased calories. She is getting 30 grams of protein shake daily. She has a Midwife and garden plans for the next few weeks.   Subjective:   1. Prediabetes, with polyphagia Kimberly Klein's last A1C was 5.5. She is on Ozempic 0.5 mg with significantly food related thinking and cravings.   2. Essential hypertension Kimberly Klein's blood pressure is controlled today 128/78.  Assessment/Plan:   1. Prediabetes, with polyphagia Kimberly Klein agrees to increase Ozempic to 1 mg subcutaneous weekly for 1 month with no refills.  She will continue to work on weight loss, exercise, and decreasing simple carbohydrates to help decrease the risk of diabetes.   - Semaglutide, 1 MG/DOSE, (OZEMPIC, 1 MG/DOSE,) 4 MG/3ML SOPN; Inject 1 mg into the skin once a week.  Dispense: 3 mL; Refill: 0  2. Essential hypertension Kimberly Klein will continue current medications with no change in medications or dose. She is working on healthy weight loss and exercise to improve blood pressure control. We will watch for signs of hypotension as she continues her lifestyle modifications.  3. Obesity with current BMI 29.1 Kimberly Klein is currently in the action stage of change. As  such, her goal is to continue with weight loss efforts. She has agreed to keeping a food journal and adhering to recommended goals of 1100-1200 calories and 75 plus grams of protein daily.   Exercise goals:  As is.  Kimberly Klein will add 10-15 minutes of resistance training 2-3 times per week.   Behavioral modification strategies: increasing lean protein intake, meal planning and cooking strategies, keeping healthy foods in the home, planning for success, and keeping a strict food journal.  Kimberly Klein has agreed to follow-up with our clinic in 3-4 weeks. She was informed of the importance of frequent follow-up visits to maximize her success with intensive lifestyle modifications for her multiple health conditions.   Objective:   Blood pressure 128/78, pulse 75, temperature 98 F (36.7 C), height '5\' 2"'$  (1.575 m), weight 159 lb (72.1 kg), SpO2 96 %. Body mass index is 29.08 kg/m.  General: Cooperative, alert, well developed, in no acute distress. HEENT: Conjunctivae and lids unremarkable. Cardiovascular: Regular rhythm.  Lungs: Normal work of breathing. Neurologic: No focal deficits.   Lab Results  Component Value Date   CREATININE 0.84 12/11/2020   BUN 10 12/11/2020   NA 139 12/11/2020   K 4.5 12/11/2020   CL 99 12/11/2020   CO2 24 12/11/2020   Lab Results  Component Value Date   ALT 20 12/11/2020   AST 19 12/11/2020   ALKPHOS 82 12/11/2020   BILITOT 0.5 12/11/2020   Lab Results  Component Value Date   HGBA1C 5.5 12/11/2020   HGBA1C 5.7 (H) 06/28/2020   HGBA1C 5.3 09/27/2018  HGBA1C 4.9 05/30/2016   Lab Results  Component Value Date   INSULIN 9.1 06/28/2020   Lab Results  Component Value Date   TSH 0.957 12/31/2020   Lab Results  Component Value Date   CHOL 273 (H) 12/11/2020   HDL 66 12/11/2020   LDLCALC 186 (H) 12/11/2020   TRIG 121 12/11/2020   CHOLHDL 4.1 12/11/2020   Lab Results  Component Value Date   VD25OH 34.7 12/11/2020   VD25OH 17.5 (L) 06/28/2020    Lab Results  Component Value Date   WBC 5.8 12/11/2020   HGB 14.7 12/11/2020   HCT 45.1 12/11/2020   MCV 94 12/11/2020   PLT 256 12/11/2020   Lab Results  Component Value Date   IRON 105 06/28/2020   TIBC 312 06/28/2020   FERRITIN 244 (H) 06/28/2020   Attestation Statements:   Reviewed by clinician on day of visit: allergies, medications, problem list, medical history, surgical history, family history, social history, and previous encounter notes.  I, Lizbeth Bark, RMA, am acting as transcriptionist for Coralie Common, MD.  I have reviewed the above documentation for accuracy and completeness, and I agree with the above. - Coralie Common, MD

## 2021-06-04 ENCOUNTER — Ambulatory Visit (INDEPENDENT_AMBULATORY_CARE_PROVIDER_SITE_OTHER): Payer: PPO | Admitting: Physician Assistant

## 2021-06-05 ENCOUNTER — Other Ambulatory Visit: Payer: PPO

## 2021-06-24 DIAGNOSIS — D485 Neoplasm of uncertain behavior of skin: Secondary | ICD-10-CM | POA: Diagnosis not present

## 2021-06-24 DIAGNOSIS — L570 Actinic keratosis: Secondary | ICD-10-CM | POA: Diagnosis not present

## 2021-07-01 ENCOUNTER — Ambulatory Visit (INDEPENDENT_AMBULATORY_CARE_PROVIDER_SITE_OTHER): Payer: PPO | Admitting: Nurse Practitioner

## 2021-07-01 ENCOUNTER — Ambulatory Visit: Payer: PPO | Admitting: Family Medicine

## 2021-07-01 ENCOUNTER — Encounter (INDEPENDENT_AMBULATORY_CARE_PROVIDER_SITE_OTHER): Payer: Self-pay | Admitting: Nurse Practitioner

## 2021-07-01 ENCOUNTER — Other Ambulatory Visit: Payer: Self-pay

## 2021-07-01 VITALS — BP 111/73 | HR 75 | Temp 97.4°F | Ht 62.0 in | Wt 155.0 lb

## 2021-07-01 DIAGNOSIS — Z7985 Long-term (current) use of injectable non-insulin antidiabetic drugs: Secondary | ICD-10-CM | POA: Diagnosis not present

## 2021-07-01 DIAGNOSIS — E669 Obesity, unspecified: Secondary | ICD-10-CM | POA: Diagnosis not present

## 2021-07-01 DIAGNOSIS — E559 Vitamin D deficiency, unspecified: Secondary | ICD-10-CM | POA: Diagnosis not present

## 2021-07-01 DIAGNOSIS — Z6828 Body mass index (BMI) 28.0-28.9, adult: Secondary | ICD-10-CM | POA: Diagnosis not present

## 2021-07-01 DIAGNOSIS — R7303 Prediabetes: Secondary | ICD-10-CM

## 2021-07-01 MED ORDER — VITAMIN D (ERGOCALCIFEROL) 1.25 MG (50000 UNIT) PO CAPS
50000.0000 [IU] | ORAL_CAPSULE | ORAL | 0 refills | Status: DC
  Filled 2021-07-01: qty 12, 84d supply, fill #0

## 2021-07-01 MED ORDER — OZEMPIC (1 MG/DOSE) 4 MG/3ML ~~LOC~~ SOPN
1.0000 mg | PEN_INJECTOR | SUBCUTANEOUS | 0 refills | Status: DC
  Filled 2021-07-01: qty 3, 28d supply, fill #0

## 2021-07-02 ENCOUNTER — Other Ambulatory Visit: Payer: Self-pay

## 2021-07-02 NOTE — Progress Notes (Signed)
Chief Complaint:   OBESITY Kimberly Klein is here to discuss her progress with her obesity treatment plan along with follow-up of her obesity related diagnoses. Kimberly Klein is on keeping a food journal and adhering to recommended goals of 1100-1200 calories and 75 grams of protein and states she is following her eating plan approximately 95% of the time. Kimberly Klein states she is walking her dog 2 times daily 45 minutes 4-5 times per week.  Today's visit was #: 14 Starting weight: 179 lbs Starting date: 06/28/2020 Today's weight: 155 lbs Today's date: 07/01/2021 Total lbs lost to date: 24 lbs Total lbs lost since last in-office visit: 4  Interim History: Kimberly Klein has done well with weight loss. She averages around 1100-1200 calories and 90 grams of protein per day. Denies hunger or cravings. She is drinking water, and three protein shakes daily. Her grand daughter just graduated from [employer].   Subjective:   1. Prediabetes, with polyphagia Kimberly Klein is currently taking Ozempic '1mg'$ , increased at her last visit. Denies any side effects. Denies hunger or cravings.  2. Vitamin D deficiency Kimberly Klein is currently taking Vit D 50,000 IU weekly. Denies any nausea, vomiting or muscle weakness.  Assessment/Plan:   1. Prediabetes, with polyphagia We will refill Ozempic 1 mg SubQ weekly for 1 month with 0 refills. Kimberly Klein is scheduled for labs next week with PCP.  Kimberly Klein will continue to work on weight loss, exercise, and decreasing simple carbohydrates to help decrease the risk of diabetes.    -Refill  Semaglutide, 1 MG/DOSE, (OZEMPIC, 1 MG/DOSE,) 4 MG/3ML SOPN; Inject 1 mg into the skin once a week.  Dispense: 3 mL; Refill: 0  2. Vitamin D deficiency We will refill Vitamin D 50,000 IU weekly for 1 month with 0 refills. Side effects discussed.   -Refill Vitamin D, Ergocalciferol, (DRISDOL) 1.25 MG (50000 UNIT) CAPS capsule; Take 1 capsule (50,000 Units total) by mouth every 7 (seven) days.  Dispense:  12 capsule; Refill: 0  Low Vitamin D level contributes to fatigue and are associated with obesity, breast, and colon cancer. She agrees to continue to take prescription Vitamin D '@50'$ ,000 IU every week and will follow-up for routine testing of Vitamin D, at least 2-3 times per year to avoid over-replacement.   3. Obesity with current BMI 28.4 Kimberly Klein is currently in the action stage of change. As such, her goal is to continue with weight loss efforts. She has agreed to keeping a food journal and adhering to recommended goals of 1100-1200 calories and 75+ grams of protein.   Exercise goals: As is.  Kimberly Klein is scheduled for labs next week with PCP.  Behavioral modification strategies: increasing lean protein intake, no skipping meals, and planning for success.  Kimberly Klein has agreed to follow-up with our clinic in 4 weeks. She was informed of the importance of frequent follow-up visits to maximize her success with intensive lifestyle modifications for her multiple health conditions.   Objective:   Blood pressure 111/73, pulse 75, temperature (!) 97.4 F (36.3 C), height '5\' 2"'$  (1.575 m), weight 155 lb (70.3 kg), SpO2 98 %. Body mass index is 28.35 kg/m.  General: Cooperative, alert, well developed, in no acute distress. HEENT: Conjunctivae and lids unremarkable. Cardiovascular: Regular rhythm.  Lungs: Normal work of breathing. Neurologic: No focal deficits.   Lab Results  Component Value Date   CREATININE 0.84 12/11/2020   BUN 10 12/11/2020   NA 139 12/11/2020   K 4.5 12/11/2020   CL 99 12/11/2020  CO2 24 12/11/2020   Lab Results  Component Value Date   ALT 20 12/11/2020   AST 19 12/11/2020   ALKPHOS 82 12/11/2020   BILITOT 0.5 12/11/2020   Lab Results  Component Value Date   HGBA1C 5.5 12/11/2020   HGBA1C 5.7 (H) 06/28/2020   HGBA1C 5.3 09/27/2018   HGBA1C 4.9 05/30/2016   Lab Results  Component Value Date   INSULIN 9.1 06/28/2020   Lab Results  Component Value Date    TSH 0.957 12/31/2020   Lab Results  Component Value Date   CHOL 273 (H) 12/11/2020   HDL 66 12/11/2020   LDLCALC 186 (H) 12/11/2020   TRIG 121 12/11/2020   CHOLHDL 4.1 12/11/2020   Lab Results  Component Value Date   VD25OH 34.7 12/11/2020   VD25OH 17.5 (L) 06/28/2020   Lab Results  Component Value Date   WBC 5.8 12/11/2020   HGB 14.7 12/11/2020   HCT 45.1 12/11/2020   MCV 94 12/11/2020   PLT 256 12/11/2020   Lab Results  Component Value Date   IRON 105 06/28/2020   TIBC 312 06/28/2020   FERRITIN 244 (H) 06/28/2020   Attestation Statements:   Reviewed by clinician on day of visit: allergies, medications, problem list, medical history, surgical history, family history, social history, and previous encounter notes.  I, Brendell Tyus, RMA, am acting as transcriptionist for Everardo Pacific, FNP..  I have reviewed the above documentation for accuracy and completeness, and I agree with the above. Everardo Pacific, FNP

## 2021-07-04 ENCOUNTER — Ambulatory Visit (INDEPENDENT_AMBULATORY_CARE_PROVIDER_SITE_OTHER): Payer: PPO | Admitting: Family Medicine

## 2021-07-04 ENCOUNTER — Encounter: Payer: Self-pay | Admitting: Family Medicine

## 2021-07-04 ENCOUNTER — Other Ambulatory Visit: Payer: Self-pay

## 2021-07-04 VITALS — BP 121/74 | HR 74 | Temp 97.5°F | Wt 160.4 lb

## 2021-07-04 DIAGNOSIS — K219 Gastro-esophageal reflux disease without esophagitis: Secondary | ICD-10-CM | POA: Diagnosis not present

## 2021-07-04 DIAGNOSIS — R7303 Prediabetes: Secondary | ICD-10-CM | POA: Diagnosis not present

## 2021-07-04 DIAGNOSIS — E538 Deficiency of other specified B group vitamins: Secondary | ICD-10-CM

## 2021-07-04 DIAGNOSIS — E559 Vitamin D deficiency, unspecified: Secondary | ICD-10-CM

## 2021-07-04 DIAGNOSIS — R7301 Impaired fasting glucose: Secondary | ICD-10-CM | POA: Diagnosis not present

## 2021-07-04 DIAGNOSIS — I10 Essential (primary) hypertension: Secondary | ICD-10-CM | POA: Diagnosis not present

## 2021-07-04 DIAGNOSIS — E7800 Pure hypercholesterolemia, unspecified: Secondary | ICD-10-CM

## 2021-07-04 DIAGNOSIS — B001 Herpesviral vesicular dermatitis: Secondary | ICD-10-CM | POA: Diagnosis not present

## 2021-07-04 MED ORDER — AMLODIPINE BESYLATE 5 MG PO TABS
5.0000 mg | ORAL_TABLET | Freq: Every day | ORAL | 1 refills | Status: DC
  Filled 2021-07-04: qty 90, 90d supply, fill #0
  Filled 2021-10-30: qty 90, 90d supply, fill #1

## 2021-07-04 MED ORDER — LISINOPRIL 20 MG PO TABS
ORAL_TABLET | ORAL | 1 refills | Status: DC
  Filled 2021-07-04: qty 90, 90d supply, fill #0
  Filled 2022-01-30: qty 90, 90d supply, fill #1

## 2021-07-04 MED ORDER — VALACYCLOVIR HCL 1 G PO TABS
1000.0000 mg | ORAL_TABLET | Freq: Two times a day (BID) | ORAL | 6 refills | Status: DC
  Filled 2021-07-04: qty 20, 10d supply, fill #0

## 2021-07-04 NOTE — Assessment & Plan Note (Signed)
Doing well with A1c of 5.6. Continue current regimen. Continue to monitor.

## 2021-07-04 NOTE — Assessment & Plan Note (Signed)
Under good control on current regimen. Continue current regimen. Continue to monitor. Call with any concerns. Refills given. Labs drawn today. Await results.  

## 2021-07-04 NOTE — Assessment & Plan Note (Signed)
Rechecking labs today. Await results. Treat as needed.  °

## 2021-07-04 NOTE — Assessment & Plan Note (Signed)
Under good control on current regimen. Continue current regimen. Continue to monitor. Call with any concerns. Refills given. Labs drawn today.   

## 2021-07-04 NOTE — Progress Notes (Signed)
BP 121/74   Pulse 74   Temp (!) 97.5 F (36.4 C) (Oral)   Wt 160 lb 6.4 oz (72.8 kg)   SpO2 98%   BMI 29.34 kg/m    Subjective:    Patient ID: Kimberly Klein, female    DOB: [birth year], 71 y.o.   MRN: [id number]  HPI: Kimberly Klein is a 71 y.o. female  Chief Complaint  Patient presents with   Hypertension   HYPERTENSION / HYPERLIPIDEMIA Satisfied with current treatment? yes Duration of hypertension: chronic BP monitoring frequency: not checking BP medication side effects: no Past BP meds: amlodipne, lisinopril Duration of hyperlipidemia: chronic Cholesterol medication side effects: not on anything Cholesterol supplements: none Past cholesterol medications: none Medication compliance: excellent compliance Aspirin: no Recent stressors: no Recurrent headaches: no Visual changes: no Palpitations: no Dyspnea: no Chest pain: no Lower extremity edema: no Dizzy/lightheaded: no  Impaired Fasting Glucose HbA1C:  Lab Results  Component Value Date   HGBA1C 5.5 12/11/2020   Duration of elevated blood sugar: chronic Polydipsia: no Polyuria: no Weight change: yes Visual disturbance: no Glucose Monitoring: no    Accucheck frequency: Not Checking  Diabetic Education: Not Completed   Relevant past medical, surgical, family and social history reviewed and updated as indicated. Interim medical history since our last visit reviewed. Allergies and medications reviewed and updated.  Review of Systems  Constitutional: Negative.   Respiratory: Negative.    Cardiovascular: Negative.   Gastrointestinal: Negative.   Musculoskeletal: Negative.   Neurological: Negative.   Psychiatric/Behavioral: Negative.      Per HPI unless specifically indicated above     Objective:    BP 121/74   Pulse 74   Temp (!) 97.5 F (36.4 C) (Oral)   Wt 160 lb 6.4 oz (72.8 kg)   SpO2 98%   BMI 29.34 kg/m   Wt Readings from Last 3 Encounters:  07/04/21 160 lb 6.4 oz (72.8 kg)   07/01/21 155 lb (70.3 kg)  05/27/21 159 lb (72.1 kg)    Physical Exam Vitals and nursing note reviewed.  Constitutional:      General: She is not in acute distress.    Appearance: Normal appearance. She is not ill-appearing, toxic-appearing or diaphoretic.  HENT:     Head: Normocephalic and atraumatic.     Right Ear: External ear normal.     Left Ear: External ear normal.     Nose: Nose normal.     Mouth/Throat:     Mouth: Mucous membranes are moist.     Pharynx: Oropharynx is clear.  Eyes:     General: No scleral icterus.       Right eye: No discharge.        Left eye: No discharge.     Extraocular Movements: Extraocular movements intact.     Conjunctiva/sclera: Conjunctivae normal.     Pupils: Pupils are equal, round, and reactive to light.  Cardiovascular:     Rate and Rhythm: Normal rate and regular rhythm.     Pulses: Normal pulses.     Heart sounds: Normal heart sounds. No murmur heard.    No friction rub. No gallop.  Pulmonary:     Effort: Pulmonary effort is normal. No respiratory distress.     Breath sounds: Normal breath sounds. No stridor. No wheezing, rhonchi or rales.  Chest:     Chest wall: No tenderness.  Musculoskeletal:        General: Normal range of motion.     Cervical back:  Normal range of motion and neck supple.  Skin:    General: Skin is warm and dry.     Capillary Refill: Capillary refill takes less than 2 seconds.     Coloration: Skin is not jaundiced or pale.     Findings: No bruising, erythema, lesion or rash.  Neurological:     General: No focal deficit present.     Mental Status: She is alert and oriented to person, place, and time. Mental status is at baseline.  Psychiatric:        Mood and Affect: Mood normal.        Behavior: Behavior normal.        Thought Content: Thought content normal.        Judgment: Judgment normal.     Results for orders placed or performed in visit on 12/31/20  TSH  Result Value Ref Range   TSH 0.957  0.450 - 4.500 uIU/mL  Microalbumin, Urine Waived  Result Value Ref Range   Microalb, Ur Waived 30 (H) 0 - 19 mg/L   Creatinine, Urine Waived 100 10 - 300 mg/dL   Microalb/Creat Ratio <30 <30 mg/g      Assessment & Plan:   Problem List Items Addressed This Visit       Cardiovascular and Mediastinum   Essential hypertension    Under good control on current regimen. Continue current regimen. Continue to monitor. Call with any concerns. Refills given. Labs drawn today. Await results.        Relevant Medications   amLODipine (NORVASC) 5 MG tablet   lisinopril (ZESTRIL) 20 MG tablet   Other Relevant Orders   Comprehensive metabolic panel   CBC with Differential/Platelet     Digestive   GERD (gastroesophageal reflux disease)    Under good control on current regimen. Continue current regimen. Continue to monitor. Call with any concerns. Refills given. Labs drawn today. Await results.        Relevant Orders   Comprehensive metabolic panel   CBC with Differential/Platelet   Recurrent cold sores    Under good control on current regimen. Continue current regimen. Continue to monitor. Call with any concerns. Refills given.        Relevant Medications   valACYclovir (VALTREX) 1000 MG tablet     Endocrine   Impaired fasting glucose/Prediabetes    Doing well with A1c of 5.6. Continue current regimen. Continue to monitor.         Other   Vitamin D deficiency    Rechecking labs today. Await results. Treat as needed.       Relevant Orders   Comprehensive metabolic panel   CBC with Differential/Platelet   VITAMIN D 25 Hydroxy (Vit-D Deficiency, Fractures)   B12 deficiency    Rechecking labs today. Await results. Treat as needed.       Relevant Orders   Comprehensive metabolic panel   CBC with Differential/Platelet   B12   Prediabetes, with polyphagia - Primary    Doing well with A1c of 5.6. Continue current regimen. Continue to monitor.       Relevant Orders    Comprehensive metabolic panel   CBC with Differential/Platelet   Bayer DCA Hb A1c Waived   Pure hypercholesterolemia    Under good control on current regimen. Continue current regimen. Continue to monitor. Call with any concerns. Refills given. Labs drawn today.        Relevant Medications   amLODipine (NORVASC) 5 MG tablet   lisinopril (ZESTRIL) 20 MG  tablet   Other Relevant Orders   Comprehensive metabolic panel   CBC with Differential/Platelet   Lipid Panel w/o Chol/HDL Ratio     Follow up plan: Return in about 6 months (around 01/03/2022) for Physical.

## 2021-07-04 NOTE — Assessment & Plan Note (Signed)
Under good control on current regimen. Continue current regimen. Continue to monitor. Call with any concerns. Refills given.   

## 2021-07-05 LAB — COMPREHENSIVE METABOLIC PANEL
ALT: 17 IU/L (ref 0–32)
AST: 18 IU/L (ref 0–40)
Albumin/Globulin Ratio: 2.1 (ref 1.2–2.2)
Albumin: 4.4 g/dL (ref 3.7–4.7)
Alkaline Phosphatase: 85 IU/L (ref 44–121)
BUN/Creatinine Ratio: 20 (ref 12–28)
BUN: 20 mg/dL (ref 8–27)
Bilirubin Total: 0.3 mg/dL (ref 0.0–1.2)
CO2: 23 mmol/L (ref 20–29)
Calcium: 9.8 mg/dL (ref 8.7–10.3)
Chloride: 98 mmol/L (ref 96–106)
Creatinine, Ser: 0.99 mg/dL (ref 0.57–1.00)
Globulin, Total: 2.1 g/dL (ref 1.5–4.5)
Glucose: 98 mg/dL (ref 70–99)
Potassium: 4.1 mmol/L (ref 3.5–5.2)
Sodium: 136 mmol/L (ref 134–144)
Total Protein: 6.5 g/dL (ref 6.0–8.5)
eGFR: 61 mL/min/{1.73_m2} (ref >59)

## 2021-07-05 LAB — CBC WITH DIFFERENTIAL/PLATELET
Basophils Absolute: 0 10*3/uL (ref 0.0–0.2)
Basos: 1 %
EOS (ABSOLUTE): 0.1 10*3/uL (ref 0.0–0.4)
Eos: 2 %
Hematocrit: 40.6 % (ref 34.0–46.6)
Hemoglobin: 13.6 g/dL (ref 11.1–15.9)
Immature Grans (Abs): 0.1 10*3/uL (ref 0.0–0.1)
Immature Granulocytes: 1 %
Lymphocytes Absolute: 1.3 10*3/uL (ref 0.7–3.1)
Lymphs: 21 %
MCH: 31.1 pg (ref 26.6–33.0)
MCHC: 33.5 g/dL (ref 31.5–35.7)
MCV: 93 fL (ref 79–97)
Monocytes Absolute: 0.7 10*3/uL (ref 0.1–0.9)
Monocytes: 11 %
Neutrophils Absolute: 3.9 10*3/uL (ref 1.4–7.0)
Neutrophils: 64 %
Platelets: 214 10*3/uL (ref 150–450)
RBC: 4.37 x10E6/uL (ref 3.77–5.28)
RDW: 11.8 % (ref 11.7–15.4)
WBC: 6 10*3/uL (ref 3.4–10.8)

## 2021-07-05 LAB — LIPID PANEL W/O CHOL/HDL RATIO
Cholesterol, Total: 223 mg/dL — ABNORMAL HIGH (ref 100–199)
HDL: 65 mg/dL (ref >39)
LDL Chol Calc (NIH): 134 mg/dL — ABNORMAL HIGH (ref 0–99)
Triglycerides: 139 mg/dL (ref 0–149)
VLDL Cholesterol Cal: 24 mg/dL (ref 5–40)

## 2021-07-05 LAB — BAYER DCA HB A1C WAIVED: HB A1C (BAYER DCA - WAIVED): 5.6 % (ref 4.8–5.6)

## 2021-07-05 LAB — VITAMIN B12: Vitamin B-12: 823 pg/mL (ref 232–1245)

## 2021-07-05 LAB — VITAMIN D 25 HYDROXY (VIT D DEFICIENCY, FRACTURES): Vit D, 25-Hydroxy: 38.2 ng/mL (ref 30.0–100.0)

## 2021-07-22 ENCOUNTER — Other Ambulatory Visit: Payer: Self-pay

## 2021-07-22 ENCOUNTER — Inpatient Hospital Stay: Payer: PPO | Attending: Hematology and Oncology | Admitting: Hematology and Oncology

## 2021-07-22 ENCOUNTER — Encounter: Payer: Self-pay | Admitting: Hematology and Oncology

## 2021-07-22 VITALS — BP 152/82 | HR 77 | Temp 97.2°F | Resp 16 | Wt 165.2 lb

## 2021-07-22 DIAGNOSIS — Z923 Personal history of irradiation: Secondary | ICD-10-CM | POA: Diagnosis not present

## 2021-07-22 DIAGNOSIS — Z807 Family history of other malignant neoplasms of lymphoid, hematopoietic and related tissues: Secondary | ICD-10-CM | POA: Diagnosis not present

## 2021-07-22 DIAGNOSIS — Z808 Family history of malignant neoplasm of other organs or systems: Secondary | ICD-10-CM | POA: Diagnosis not present

## 2021-07-22 DIAGNOSIS — D0511 Intraductal carcinoma in situ of right breast: Secondary | ICD-10-CM | POA: Diagnosis not present

## 2021-07-22 DIAGNOSIS — Z87891 Personal history of nicotine dependence: Secondary | ICD-10-CM | POA: Diagnosis not present

## 2021-07-22 DIAGNOSIS — Z8052 Family history of malignant neoplasm of bladder: Secondary | ICD-10-CM | POA: Diagnosis not present

## 2021-07-22 DIAGNOSIS — I10 Essential (primary) hypertension: Secondary | ICD-10-CM | POA: Insufficient documentation

## 2021-07-22 DIAGNOSIS — Z853 Personal history of malignant neoplasm of breast: Secondary | ICD-10-CM | POA: Insufficient documentation

## 2021-07-22 NOTE — Progress Notes (Signed)
SURVIVORSHIP VISIT:    BRIEF ONCOLOGIC HISTORY:  Oncology History  Ductal carcinoma in situ (DCIS) of right breast  03/28/2020 Initial Diagnosis   Kimberly Klein woman status post right breast biopsy x2 on 03/28/2020 for ductal carcinoma in situ measuring approximately 5 cm, grade 2 or 3, estrogen receptor weakly positive at 20%, progesterone receptor negative   04/04/2020 Cancer Staging   Staging form: Breast, AJCC 8th Edition - Clinical stage from 04/04/2020: Stage 0 (cTis (DCIS), cN0, cM0, ER+, PR-) - Signed by Chauncey Cruel, MD on 04/04/2020 Stage prefix: Initial diagnosis Nuclear grade: GX Laterality: Right Staged by: Pathologist and managing physician Stage used in treatment planning: Yes National guidelines used in treatment planning: Yes Type of national guideline used in treatment planning: NCCN   05/02/2020 Surgery   status post right lumpectomy 05/02/2020 for a 6.7 cm high-grade ductal carcinoma in situ, with focally positive margins.             (a) additional surgery 07/11/2020 obtained at negative margins.   08/20/2020 - 10/04/2020 Radiation Therapy   08/20/2020 through 10/04/2020 Site Technique Total Dose (Gy) Dose per Fx (Gy) Completed Fx Beam Energies  Breast, Right: Breast_Rt 3D 50.4/50.4 1.8 28/28 6X, 10X  Breast, Right: Breast_Rt_Bst 3D 10/10 2 5/5 6X, 10X     11/29/2020 -  Anti-estrogen oral therapy   Anastrozole daily   01/18/2021 Cancer Staging   Staging form: Breast, AJCC 8th Edition - Pathologic: Stage 0 (pTis (DCIS), pN0, cM0, ER+, PR-) - Signed by Gardenia Phlegm, NP on 01/18/2021     INTERVAL HISTORY:  Kimberly Klein is here for follow-up.  Kimberly Klein has not tried anastrozole.  Kimberly Klein tells me that Kimberly Klein is really worried about the adverse effects.  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 Kimberly Klein denies any breast changes.  Last mammogram in March without any remarkable  changes. Rest of the pertinent 10 point ROS reviewed and negative  REVIEW OF SYSTEMS:  Review of Systems  Constitutional:  Negative for appetite change, chills, fatigue, fever and unexpected weight change.  HENT:   Negative for hearing loss, lump/mass and trouble swallowing.   Eyes:  Negative for eye problems and icterus.  Respiratory:  Negative for chest tightness, cough and shortness of breath.   Cardiovascular:  Negative for chest pain, leg swelling and palpitations.  Gastrointestinal:  Negative for abdominal distention, abdominal pain, constipation, diarrhea, nausea and vomiting.  Endocrine: Negative for hot flashes.  Genitourinary:  Negative for difficulty urinating.   Musculoskeletal:  Negative for arthralgias.  Skin:  Negative for itching and rash.  Neurological:  Negative for dizziness, extremity weakness, headaches and numbness.  Hematological:  Negative for adenopathy. Does not bruise/bleed easily.  Psychiatric/Behavioral:  Negative for depression. The patient is not nervous/anxious.    Breast: Denies any new nodularity, masses, tenderness, nipple changes, or nipple discharge.      ONCOLOGY TREATMENT TEAM:  1. Surgeon:  Dr. Barry Dienes at [facility] 2. Medical Oncologist: Dr. Jana Hakim  3. Radiation Oncologist: Dr. Sondra Come    PAST MEDICAL/SURGICAL HISTORY:  Past Medical History:  Diagnosis Date   Back pain    Breast cancer ([place]) 03/2020   Dysrhythmia    SVT at times   History of radiation therapy    Right breast -08/20/20-10/04/20- Dr. Gery Pray   Hx of cold sores    Hypertension 2019   Joint pain    Macular degeneration  Palpitations    Personal history of radiation therapy    Sciatica    SVT (supraventricular tachycardia) ([place]) 07/2013   strees test- normal   Past Surgical History:  Procedure Laterality Date   appendectomy     APPENDECTOMY  1921   AUGMENTATION MAMMAPLASTY Bilateral    implants removed 2022   BREAST BIOPSY Bilateral  03/28/2020   lt, rt x3   BREAST LUMPECTOMY Right 05/02/2020   BREAST LUMPECTOMY WITH RADIOACTIVE SEED LOCALIZATION Right 05/02/2020   Procedure: RIGHT BREAST LUMPECTOMY WITH RADIOACTIVE SEED LOCALIZATION X 2;  Surgeon: [name], MD;  Location: [facility];  Service: General;  Laterality: Right;   EYE SURGERY Bilateral 2022   cataract surgery   MASTOPEXY Bilateral 07/11/2020   Procedure: BILATERAL MASTOPEXY AND REMOVAL OF IMPLANTS WITH COMPLETE CAPSULECTOMY;  Surgeon: Wallace Going, DO;  Location: [place];  Service: Plastics;  Laterality: Bilateral;   RE-EXCISION OF BREAST LUMPECTOMY Right 07/11/2020   Procedure: RE-EXCISION OF RIGHT BREAST LUMPECTOMY;  Surgeon: [name], MD;  Location: [facility];  Service: General;  Laterality: Right;     ALLERGIES:  No Known Allergies   CURRENT MEDICATIONS:  Outpatient Encounter Medications as of 07/22/2021  Medication Sig   amLODipine (NORVASC) 5 MG tablet Take 1 tablet (5 mg total) by mouth daily. TAKE 1 TABLET(5 MG) BY MOUTH DAILY   lisinopril (ZESTRIL) 20 MG tablet TAKE 1 TABLET(20 MG) BY MOUTH DAILY   Multiple Vitamins-Minerals (PRESERVISION AREDS 2+MULTI VIT PO) Take 1 capsule by mouth in the morning and at bedtime.   Semaglutide, 1 MG/DOSE, (OZEMPIC, 1 MG/DOSE,) 4 MG/3ML SOPN Inject 1 mg into the skin once a week.   valACYclovir (VALTREX) 1000 MG tablet Take 1 tablet (1,000 mg total) by mouth 2 (two) times daily.   Vitamin D, Ergocalciferol, (DRISDOL) 1.25 MG (50000 UNIT) CAPS capsule Take 1 capsule (50,000 Units total) by mouth every 7 (seven) days.   No facility-administered encounter medications on file as of 07/22/2021.     ONCOLOGIC FAMILY HISTORY:  Family History  Problem Relation Age of Onset   Depression Mother    Obesity Mother    Hypertension Father    Hyperlipidemia Father    Heart attack Father    Stroke Father    Heart disease Father    Liver cancer Sister    Bladder Cancer Brother    Multiple  myeloma Brother      GENETIC COUNSELING/TESTING: Not at this time   SOCIAL HISTORY:  Social History   Socioeconomic History   Marital status: Widowed    Spouse name: Not on file   Number of children: Not on file   Years of education: Not on file   Highest education level: Not on file  Occupational History   Occupation: PRN RN   Tobacco Use   Smoking status: Former    Packs/day: 0.00    Years: 20.00    Total pack years: 0.00    Types: Cigarettes    Quit date: 10/03/2007    Years since quitting: 13.8   Smokeless tobacco: Never  Vaping Use   Vaping Use: Never used  Substance and Sexual Activity   Alcohol use: Yes    Alcohol/week: 4.0 - 5.0 standard drinks of alcohol    Types: 4 - 5 Glasses of wine per week    Comment: occasional   Drug use: No   Sexual activity: Not Currently    Birth control/protection: Post-menopausal  Other Topics Concern   Not on  file  Social History Narrative   Not on file   Social Determinants of Health   Financial Resource Strain: Low Risk  (08/13/2020)   Overall Financial Resource Strain (CARDIA)    Difficulty of Paying Living Expenses: Not hard at all  Food Insecurity: No Food Insecurity (08/13/2020)   Hunger Vital Sign    Worried About Running Out of Food in the Last Year: Never true    [place] in the Last Year: Never true  Transportation Needs: No Transportation Needs (08/13/2020)   PRAPARE - Hydrologist (Medical): No    Lack of Transportation (Non-Medical): No  Physical Activity: Sufficiently Active (08/13/2020)   Exercise Vital Sign    Days of Exercise per Week: 4 days    Minutes of Exercise per Session: 40 min  Stress: No Stress Concern Present (08/13/2020)   [place]    Feeling of Stress : Not at all  Social Connections: Socially Isolated (01/21/2021)   Social Connection and Isolation Panel [NHANES]    Frequency of Communication with  Friends and Family: More than three times a week    Frequency of Social Gatherings with Friends and Family: Once a week    Attends Religious Services: Never    Marine scientist or Organizations: No    Attends Archivist Meetings: Never    Marital Status: Widowed  Intimate Partner Violence: Not At Risk (01/21/2021)   Humiliation, Afraid, Rape, and Kick questionnaire    Fear of Current or Ex-Partner: No    Emotionally Abused: No    Physically Abused: No    Sexually Abused: No     OBSERVATIONS/OBJECTIVE:  BP (!) 152/82 (BP Location: Left Arm, Patient Position: Sitting)   Pulse 77   Temp (!) 97.2 F (36.2 C) (Temporal)   Resp 16   Wt 165 lb 3 oz (74.9 kg)   SpO2 98%   BMI 30.21 kg/m    Physical Exam Constitutional:      Appearance: Normal appearance.  Chest:     Comments: Bilateral breast examined.  Right breast status post lumpectomy.  Postsurgical changes.  No palpable masses or regional adenopathy.  Left breast normal to inspection and palpation Musculoskeletal:        General: No swelling or tenderness. Normal range of motion.     Cervical back: Normal range of motion and neck supple. No rigidity.  Lymphadenopathy:     Cervical: No cervical adenopathy.  Neurological:     Mental Status: Kimberly Klein is alert.      LABORATORY DATA:  None for this visit.  DIAGNOSTIC IMAGING:  None for this visit.      ASSESSMENT AND PLAN:   Ms.. Klein is a pleasant 71 y.o. 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  Kimberly [facility] for our initial meeting and routine follow-up post-completion of treatment for breast cancer.   1. Stage 0 right breast cancer:  Kimberly Klein is continuing to recover from definitive treatment for breast cancer. We have once again  discussed the role of antiestrogen therapy and side effects from anastrozole.  I have once again recommended that Kimberly Klein try an antiestrogen therapy for 3 to 4 months at least.  We will schedule a telephone visit in about 8  weeks to review tolerance.  Kimberly Klein will continue annual mammograms, last mammogram in March without any concern for malignancy.  Physical examination today without any concerns.    2. Bone health:  Given Kimberly Klein age/history of breast cancer and her current treatment regimen including anti-estrogen therapy with Anastrozole, Kimberly Klein is at risk for bone demineralization.  Kimberly Klein was scheduled in May 2023 for bone density however Kimberly Klein had to cancel this.  I encouraged her to reschedule this.  Kimberly Klein was also encouraged to continue vitamin D/calcium rich foods or supplements and some weightbearing exercises.    Follow up instructions:    -Return to cancer center in 6 months in person Telephone visit in 8 weeks -Mammogram in March 2024 -Bone density 05/2021, counseled, recommended to reschedule  The patient was provided an opportunity to ask questions and all were answered. The patient agreed with the plan and demonstrated an understanding of the instructions.   Total encounter time: 30 minutes in face-to-face visit time, chart review, lab review, care coordination, and documentation of the encounter.  Benay Pike MD   *Total Encounter Time as defined by the Centers for Medicare and Medicaid Services includes, in addition to the face-to-face time of a patient visit (documented in the note above) non-face-to-face time: obtaining and reviewing outside history, ordering and reviewing medications, tests or procedures, care coordination (communications with other health care professionals or caregivers) and documentation in the medical record.

## 2021-07-23 ENCOUNTER — Encounter (HOSPITAL_COMMUNITY): Payer: Self-pay

## 2021-07-26 ENCOUNTER — Encounter (INDEPENDENT_AMBULATORY_CARE_PROVIDER_SITE_OTHER): Payer: Self-pay | Admitting: Nurse Practitioner

## 2021-07-29 ENCOUNTER — Ambulatory Visit (INDEPENDENT_AMBULATORY_CARE_PROVIDER_SITE_OTHER): Payer: PPO | Admitting: Nurse Practitioner

## 2021-07-29 NOTE — Telephone Encounter (Signed)
Appt has been cancelled.  

## 2021-08-16 ENCOUNTER — Ambulatory Visit: Payer: PPO

## 2021-08-21 ENCOUNTER — Encounter (INDEPENDENT_AMBULATORY_CARE_PROVIDER_SITE_OTHER): Payer: Self-pay

## 2021-08-28 ENCOUNTER — Ambulatory Visit (INDEPENDENT_AMBULATORY_CARE_PROVIDER_SITE_OTHER): Payer: PPO | Admitting: Nurse Practitioner

## 2021-09-17 ENCOUNTER — Inpatient Hospital Stay: Payer: PPO | Attending: Hematology and Oncology | Admitting: Hematology and Oncology

## 2021-09-17 ENCOUNTER — Encounter: Payer: Self-pay | Admitting: Hematology and Oncology

## 2021-09-17 ENCOUNTER — Other Ambulatory Visit: Payer: Self-pay

## 2021-09-17 DIAGNOSIS — D0511 Intraductal carcinoma in situ of right breast: Secondary | ICD-10-CM | POA: Diagnosis not present

## 2021-09-17 MED ORDER — ANASTROZOLE 1 MG PO TABS
1.0000 mg | ORAL_TABLET | Freq: Every day | ORAL | 3 refills | Status: DC
  Filled 2021-09-17 – 2021-10-30 (×2): qty 90, 90d supply, fill #0

## 2021-09-17 NOTE — Progress Notes (Signed)
SURVIVORSHIP VISIT:    BRIEF ONCOLOGIC HISTORY:  Oncology History  Ductal carcinoma in situ (DCIS) of right breast  03/28/2020 Initial Diagnosis   Christian woman status post right breast biopsy x2 on 03/28/2020 for ductal carcinoma in situ measuring approximately 5 cm, grade 2 or 3, estrogen receptor weakly positive at 20%, progesterone receptor negative   04/04/2020 Cancer Staging   Staging form: Breast, AJCC 8th Edition - Clinical stage from 04/04/2020: Stage 0 (cTis (DCIS), cN0, cM0, ER+, PR-) - Signed by Chauncey Cruel, MD on 04/04/2020 Stage prefix: Initial diagnosis Nuclear grade: GX Laterality: Right Staged by: Pathologist and managing physician Stage used in treatment planning: Yes National guidelines used in treatment planning: Yes Type of national guideline used in treatment planning: NCCN   05/02/2020 Surgery   status post right lumpectomy 05/02/2020 for a 6.7 cm high-grade ductal carcinoma in situ, with focally positive margins.             (a) additional surgery 07/11/2020 obtained at negative margins.   08/20/2020 - 10/04/2020 Radiation Therapy   08/20/2020 through 10/04/2020 Site Technique Total Dose (Gy) Dose per Fx (Gy) Completed Fx Beam Energies  Breast, Right: Breast_Rt 3D 50.4/50.4 1.8 28/28 6X, 10X  Breast, Right: Breast_Rt_Bst 3D 10/10 2 5/5 6X, 10X     11/29/2020 -  Anti-estrogen oral therapy   Anastrozole daily   01/18/2021 Cancer Staging   Staging form: Breast, AJCC 8th Edition - Pathologic: Stage 0 (pTis (DCIS), pN0, cM0, ER+, PR-) - Signed by Gardenia Phlegm, NP on 01/18/2021     INTERVAL HISTORY:  Ms. Fitzsimons is here for follow-up.   She is here for telephone visit.  Since last visit, she has tried anastrozole.  She has no adverse effects so far.  She is very compliant with it.  She is yet to reschedule her bone density.  She is otherwise taking her calcium and vitamin D supplementation daily. She denies any breast changes.  Last mammogram in  March without any remarkable changes. Rest of the pertinent 10 point ROS reviewed and negative  REVIEW OF SYSTEMS:  Review of Systems  Constitutional:  Negative for appetite change, chills, fatigue, fever and unexpected weight change.  HENT:   Negative for hearing loss, lump/mass and trouble swallowing.   Eyes:  Negative for eye problems and icterus.  Respiratory:  Negative for chest tightness, cough and shortness of breath.   Cardiovascular:  Negative for chest pain, leg swelling and palpitations.  Gastrointestinal:  Negative for abdominal distention, abdominal pain, constipation, diarrhea, nausea and vomiting.  Endocrine: Negative for hot flashes.  Genitourinary:  Negative for difficulty urinating.   Musculoskeletal:  Negative for arthralgias.  Skin:  Negative for itching and rash.  Neurological:  Negative for dizziness, extremity weakness, headaches and numbness.  Hematological:  Negative for adenopathy. Does not bruise/bleed easily.  Psychiatric/Behavioral:  Negative for depression. The patient is not nervous/anxious.    Breast: Denies any new nodularity, masses, tenderness, nipple changes, or nipple discharge.      ONCOLOGY TREATMENT TEAM:  1. Surgeon:  Dr. Barry Dienes at [facility] 2. Medical Oncologist: Dr. Jana Hakim  3. Radiation Oncologist: Dr. Sondra Come    PAST MEDICAL/SURGICAL HISTORY:  Past Medical History:  Diagnosis Date   Back pain    Breast cancer ([place]) 03/2020   Dysrhythmia    SVT at times   History of radiation therapy    Right breast -08/20/20-10/04/20- Dr. Gery Pray   Hx of cold sores    Hypertension 2019  Joint pain    Macular degeneration    Palpitations    Personal history of radiation therapy    Sciatica    SVT (supraventricular tachycardia) ([facility]) 07/2013   strees test- normal   Past Surgical History:  Procedure Laterality Date   appendectomy     APPENDECTOMY  1921   AUGMENTATION MAMMAPLASTY Bilateral    implants removed 2022    BREAST BIOPSY Bilateral 03/28/2020   lt, rt x3   BREAST LUMPECTOMY Right 05/02/2020   BREAST LUMPECTOMY WITH RADIOACTIVE SEED LOCALIZATION Right 05/02/2020   Procedure: RIGHT BREAST LUMPECTOMY WITH RADIOACTIVE SEED LOCALIZATION X 2;  Surgeon: [name], MD;  Location: [place];  Service: General;  Laterality: Right;   EYE SURGERY Bilateral 2022   cataract surgery   MASTOPEXY Bilateral 07/11/2020   Procedure: BILATERAL MASTOPEXY AND REMOVAL OF IMPLANTS WITH COMPLETE CAPSULECTOMY;  Surgeon: Wallace Going, DO;  Location: [place];  Service: Plastics;  Laterality: Bilateral;   RE-EXCISION OF BREAST LUMPECTOMY Right 07/11/2020   Procedure: RE-EXCISION OF RIGHT BREAST LUMPECTOMY;  Surgeon: [name], MD;  Location: [place];  Service: General;  Laterality: Right;     ALLERGIES:  No Known Allergies   CURRENT MEDICATIONS:  Outpatient Encounter Medications as of 09/17/2021  Medication Sig   amLODipine (NORVASC) 5 MG tablet Take 1 tablet (5 mg total) by mouth daily. TAKE 1 TABLET(5 MG) BY MOUTH DAILY   lisinopril (ZESTRIL) 20 MG tablet TAKE 1 TABLET(20 MG) BY MOUTH DAILY   Multiple Vitamins-Minerals (PRESERVISION AREDS 2+MULTI VIT PO) Take 1 capsule by mouth in the morning and at bedtime.   Semaglutide, 1 MG/DOSE, (OZEMPIC, 1 MG/DOSE,) 4 MG/3ML SOPN Inject 1 mg into the skin once a week.   valACYclovir (VALTREX) 1000 MG tablet Take 1 tablet (1,000 mg total) by mouth 2 (two) times daily.   Vitamin D, Ergocalciferol, (DRISDOL) 1.25 MG (50000 UNIT) CAPS capsule Take 1 capsule (50,000 Units total) by mouth every 7 (seven) days.   No facility-administered encounter medications on file as of 09/17/2021.     ONCOLOGIC FAMILY HISTORY:  Family History  Problem Relation Age of Onset   Depression Mother    Obesity Mother    Hypertension Father    Hyperlipidemia Father    Heart attack Father    Stroke Father    Heart disease Father    Liver cancer Sister    Bladder Cancer  Brother    Multiple myeloma Brother      GENETIC COUNSELING/TESTING: Not at this time   SOCIAL HISTORY:  Social History   Socioeconomic History   Marital status: Widowed    Spouse name: Not on file   Number of children: Not on file   Years of education: Not on file   Highest education level: Not on file  Occupational History   Occupation: PRN RN   Tobacco Use   Smoking status: Former    Packs/day: 0.00    Years: 20.00    Total pack years: 0.00    Types: Cigarettes    Quit date: 10/03/2007    Years since quitting: 13.9   Smokeless tobacco: Never  Vaping Use   Vaping Use: Never used  Substance and Sexual Activity   Alcohol use: Yes    Alcohol/week: 4.0 - 5.0 standard drinks of alcohol    Types: 4 - 5 Glasses of wine per week    Comment: occasional   Drug use: No   Sexual activity: Not Currently    Birth  control/protection: Post-menopausal  Other Topics Concern   Not on file  Social History Narrative   Not on file   Social Determinants of Health   Financial Resource Strain: Low Risk  (08/13/2020)   Overall Financial Resource Strain (CARDIA)    Difficulty of Paying Living Expenses: Not hard at all  Food Insecurity: No Food Insecurity (08/13/2020)   Hunger Vital Sign    Worried About Running Out of Food in the Last Year: Never true    [place] in the Last Year: Never true  Transportation Needs: No Transportation Needs (08/13/2020)   PRAPARE - Hydrologist (Medical): No    Lack of Transportation (Non-Medical): No  Physical Activity: Sufficiently Active (08/13/2020)   Exercise Vital Sign    Days of Exercise per Week: 4 days    Minutes of Exercise per Session: 40 min  Stress: No Stress Concern Present (08/13/2020)   [place]    Feeling of Stress : Not at all  Social Connections: Socially Isolated (01/21/2021)   Social Connection and Isolation Panel [NHANES]    Frequency  of Communication with Friends and Family: More than three times a week    Frequency of Social Gatherings with Friends and Family: Once a week    Attends Religious Services: Never    Marine scientist or Organizations: No    Attends Archivist Meetings: Never    Marital Status: Widowed  Intimate Partner Violence: Not At Risk (01/21/2021)   Humiliation, Afraid, Rape, and Kick questionnaire    Fear of Current or Ex-Partner: No    Emotionally Abused: No    Physically Abused: No    Sexually Abused: No     OBSERVATIONS/OBJECTIVE:  There were no vitals taken for this visit.  Physical examination not done, telephone visit   LABORATORY DATA:  None for this visit.  DIAGNOSTIC IMAGING:  None for this visit.     ASSESSMENT AND PLAN:   Ms.. Poullard is a pleasant 71 y.o. 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  During her last visit in July have recommended that she try the anastrozole for at least 3 to 4 months and today was a telephone visit to review the tolerance.    I tried calling her again, was able to get hold of her.  She started taking anastrozole and has been tolerating it really well.  She denies any adverse effects at all.  She is yet to reschedule her bone density.  In the meantime she has been checking her breasts and denies any changes.  She is also taking the calcium and vitamin D supplementation.  I have encouraged her to call and reschedule bone density and she expressed understanding.  She will otherwise return to clinic in January as scheduled. Total time spent: 10 minutes.  Benay Pike MD   *Total Encounter Time as defined by the Centers for Medicare and Medicaid Services includes, in addition to the face-to-face time of a patient visit (documented in the note  above) non-face-to-face time: obtaining and reviewing outside history, ordering and reviewing medications, tests or procedures, care coordination (communications with other health care professionals or caregivers) and documentation in the medical record.

## 2021-09-24 ENCOUNTER — Telehealth: Payer: Self-pay | Admitting: Family Medicine

## 2021-09-24 NOTE — Telephone Encounter (Signed)
Copied from [place] [phone]. Topic: Medicare AWV >> Sep 24, 2021  1:03 PM Jae Dire wrote: Reason for CRM:  Left message for patient to call back and schedule Medicare Annual Wellness Visit (AWV). Please offer to do virtually or by telephone.  Last AWV: 08/13/2020  Please schedule at any time with [facility]-Nurse Health Advisor.  30 minute appointment for Virtual or phone 45 minute appointment for Initial virtual/phone  Any questions, please contact me at [phone]

## 2021-10-11 ENCOUNTER — Other Ambulatory Visit: Payer: Self-pay

## 2021-10-24 ENCOUNTER — Ambulatory Visit: Payer: PPO

## 2021-10-30 ENCOUNTER — Other Ambulatory Visit: Payer: Self-pay

## 2021-10-30 ENCOUNTER — Encounter (INDEPENDENT_AMBULATORY_CARE_PROVIDER_SITE_OTHER): Payer: PPO | Admitting: Ophthalmology

## 2021-10-30 DIAGNOSIS — H353132 Nonexudative age-related macular degeneration, bilateral, intermediate dry stage: Secondary | ICD-10-CM

## 2021-10-30 DIAGNOSIS — H43813 Vitreous degeneration, bilateral: Secondary | ICD-10-CM

## 2021-10-31 ENCOUNTER — Ambulatory Visit (INDEPENDENT_AMBULATORY_CARE_PROVIDER_SITE_OTHER): Payer: PPO | Admitting: *Deleted

## 2021-10-31 DIAGNOSIS — Z0000 Encounter for general adult medical examination without abnormal findings: Secondary | ICD-10-CM | POA: Diagnosis not present

## 2021-10-31 NOTE — Patient Instructions (Signed)
Kimberly Klein , Thank you for taking time to come for your Medicare Wellness Visit. I appreciate your ongoing commitment to your health goals. Please review the following plan we discussed and let me know if I can assist you in the future.   These are the goals we discussed:  Goals      Patient Stated     08/13/2020, wants to weigh130 pounds        This is a list of the screening recommended for you and due dates:  Health Maintenance  Topic Date Due   COVID-19 Vaccine (4 - Pfizer risk series) 11/16/2021*   Zoster (Shingles) Vaccine (1 of 2) 01/31/2022*   Mammogram  03/22/2023   Colon Cancer Screening  12/14/2023   Tetanus Vaccine  01/14/2028   Pneumonia Vaccine  Completed   Flu Shot  Completed   DEXA scan (bone density measurement)  Completed   Hepatitis C Screening: USPSTF Recommendation to screen - Ages 81-79 yo.  Completed   HPV Vaccine  Aged Out  *Topic was postponed. The date shown is not the original due date.    Advanced directives: yes not on file  Conditions/risks identified:      Preventive Care 46 Years and Older, Female Preventive care refers to lifestyle choices and visits with your health care provider that can promote health and wellness. What does preventive care include? A yearly physical exam. This is also called an annual well check. Dental exams once or twice a year. Routine eye exams. Ask your health care provider how often you should have your eyes checked. Personal lifestyle choices, including: Daily care of your teeth and gums. Regular physical activity. Eating a healthy diet. Avoiding tobacco and drug use. Limiting alcohol use. Practicing safe sex. Taking low-dose aspirin every day. Taking vitamin and mineral supplements as recommended by your health care provider. What happens during an annual well check? The services and screenings done by your health care provider during your annual well check will depend on your age, overall health, lifestyle  risk factors, and family history of disease. Counseling  Your health care provider may ask you questions about your: Alcohol use. Tobacco use. Drug use. Emotional well-being. Home and relationship well-being. Sexual activity. Eating habits. History of falls. Memory and ability to understand (cognition). Work and work Statistician. Reproductive health. Screening  You may have the following tests or measurements: Height, weight, and BMI. Blood pressure. Lipid and cholesterol levels. These may be checked every 5 years, or more frequently if you are over 1 years old. Skin check. Lung cancer screening. You may have this screening every year starting at age 51 if you have a 30-pack-year history of smoking and currently smoke or have quit within the past 15 years. Fecal occult blood test (FOBT) of the stool. You may have this test every year starting at age 39. Flexible sigmoidoscopy or colonoscopy. You may have a sigmoidoscopy every 5 years or a colonoscopy every 10 years starting at age 76. Hepatitis C blood test. Hepatitis B blood test. Sexually transmitted disease (STD) testing. Diabetes screening. This is done by checking your blood sugar (glucose) after you have not eaten for a while (fasting). You may have this done every 1-3 years. Bone density scan. This is done to screen for osteoporosis. You may have this done starting at age 63. Mammogram. This may be done every 1-2 years. Talk to your health care provider about how often you should have regular mammograms. Talk with your health care provider  about your test results, treatment options, and if necessary, the need for more tests. Vaccines  Your health care provider may recommend certain vaccines, such as: Influenza vaccine. This is recommended every year. Tetanus, diphtheria, and acellular pertussis (Tdap, Td) vaccine. You may need a Td booster every 10 years. Zoster vaccine. You may need this after age 26. Pneumococcal  13-valent conjugate (PCV13) vaccine. One dose is recommended after age 32. Pneumococcal polysaccharide (PPSV23) vaccine. One dose is recommended after age 24. Talk to your health care provider about which screenings and vaccines you need and how often you need them. This information is not intended to replace advice given to you by your health care provider. Make sure you discuss any questions you have with your health care provider. Document Released: 01/26/2015 Document Revised: 09/19/2015 Document Reviewed: 10/31/2014 Elsevier Interactive Patient Education  2017 [place] Prevention in the Home Falls can cause injuries. They can happen to people of all ages. There are many things you can do to make your home safe and to help prevent falls. What can I do on the outside of my home? Regularly fix the edges of walkways and driveways and fix any cracks. Remove anything that might make you trip as you walk through a door, such as a raised step or threshold. Trim any bushes or trees on the path to your home. Use bright outdoor lighting. Clear any walking paths of anything that might make someone trip, such as rocks or tools. Regularly check to see if handrails are loose or broken. Make sure that both sides of any steps have handrails. Any raised decks and porches should have guardrails on the edges. Have any leaves, snow, or ice cleared regularly. Use sand or salt on walking paths during winter. Clean up any spills in your garage right away. This includes oil or grease spills. What can I do in the bathroom? Use night lights. Install grab bars by the toilet and in the tub and shower. Do not use towel bars as grab bars. Use non-skid mats or decals in the tub or shower. If you need to sit down in the shower, use a plastic, non-slip stool. Keep the floor dry. Clean up any water that spills on the floor as soon as it happens. Remove soap buildup in the tub or shower regularly. Attach  bath mats securely with double-sided non-slip rug tape. Do not have throw rugs and other things on the floor that can make you trip. What can I do in the bedroom? Use night lights. Make sure that you have a light by your bed that is easy to reach. Do not use any sheets or blankets that are too big for your bed. They should not hang down onto the floor. Have a firm chair that has side arms. You can use this for support while you get dressed. Do not have throw rugs and other things on the floor that can make you trip. What can I do in the kitchen? Clean up any spills right away. Avoid walking on wet floors. Keep items that you use a lot in easy-to-reach places. If you need to reach something above you, use a strong step stool that has a grab bar. Keep electrical cords out of the way. Do not use floor polish or wax that makes floors slippery. If you must use wax, use non-skid floor wax. Do not have throw rugs and other things on the floor that can make you trip. What can I do  with my stairs? Do not leave any items on the stairs. Make sure that there are handrails on both sides of the stairs and use them. Fix handrails that are broken or loose. Make sure that handrails are as long as the stairways. Check any carpeting to make sure that it is firmly attached to the stairs. Fix any carpet that is loose or worn. Avoid having throw rugs at the top or bottom of the stairs. If you do have throw rugs, attach them to the floor with carpet tape. Make sure that you have a light switch at the top of the stairs and the bottom of the stairs. If you do not have them, ask someone to add them for you. What else can I do to help prevent falls? Wear shoes that: Do not have high heels. Have rubber bottoms. Are comfortable and fit you well. Are closed at the toe. Do not wear sandals. If you use a stepladder: Make sure that it is fully opened. Do not climb a closed stepladder. Make sure that both sides of the  stepladder are locked into place. Ask someone to hold it for you, if possible. Clearly mark and make sure that you can see: Any grab bars or handrails. First and last steps. Where the edge of each step is. Use tools that help you move around (mobility aids) if they are needed. These include: Canes. Walkers. Scooters. Crutches. Turn on the lights when you go into a dark area. Replace any light bulbs as soon as they burn out. Set up your furniture so you have a clear path. Avoid moving your furniture around. If any of your floors are uneven, fix them. If there are any pets around you, be aware of where they are. Review your medicines with your doctor. Some medicines can make you feel dizzy. This can increase your chance of falling. Ask your doctor what other things that you can do to help prevent falls. This information is not intended to replace advice given to you by your health care provider. Make sure you discuss any questions you have with your health care provider. Document Released: 10/26/2008 Document Revised: 06/07/2015 Document Reviewed: 02/03/2014 Elsevier Interactive Patient Education  2017 [employer].

## 2021-10-31 NOTE — Progress Notes (Signed)
Subjective:   [name] is a 71 y.o. female who presents for Medicare Annual (Subsequent) preventive examination.  I connected with  [name] on 10/31/21 by a telephone enabled telemedicine application and verified that I am speaking with the correct person using two identifiers.   I discussed the limitations of evaluation and management by telemedicine. The patient expressed understanding and agreed to proceed.  Patient location: home  Provider location: Tele-Health-home     Review of Systems     Cardiac Risk Factors include: advanced age (>68mn, >>82women);family history of premature cardiovascular disease;hypertension     Objective:    There were no vitals filed for this visit. There is no height or weight on file to calculate BMI.     10/31/2021   12:05 PM 11/05/2020    3:20 PM 08/13/2020    1:45 PM 08/08/2020    9:52 AM 07/04/2020    2:22 PM 05/02/2020   11:26 AM 04/23/2020   12:52 PM  Advanced Directives  Does Patient Have a Medical Advance Directive? _0  Yes Yes  Type of AParamedicof AAledoLiving will HDoverLiving will HParmeleLiving will HMutualLiving will    Does patient want to make changes to medical advance directive?  No - Patient declined  No - Patient declined  No - Patient declined No - Patient declined  Copy of HQuintanain Chart? No - copy requested No - copy requested No - copy requested No - copy requested       Current Medications (verified) Outpatient Encounter Medications as of 10/31/2021  Medication Sig   amLODipine (NORVASC) 5 MG tablet Take 1 tablet (5 mg total) by mouth daily. TAKE 1 TABLET(5 MG) BY MOUTH DAILY   anastrozole (ARIMIDEX) 1 MG tablet Take 1 tablet (1 mg total) by mouth daily.   lisinopril (ZESTRIL) 20 MG tablet TAKE 1 TABLET(20 MG) BY MOUTH DAILY   Multiple  Vitamins-Minerals (PRESERVISION AREDS 2+MULTI VIT PO) Take 1 capsule by mouth in the morning and at bedtime.   Vitamin D, Ergocalciferol, (DRISDOL) 1.25 MG (50000 UNIT) CAPS capsule Take 1 capsule (50,000 Units total) by mouth every 7 (seven) days.   Semaglutide, 1 MG/DOSE, (OZEMPIC, 1 MG/DOSE,) 4 MG/3ML SOPN Inject 1 mg into the skin once a week.   No facility-administered encounter medications on file as of 10/31/2021.    Allergies (verified) Patient has no known allergies.   History: Past Medical History:  Diagnosis Date   Back pain    [place]ast cancer [place] 03[place]   Dysrhythmia    SVT at times   History of radiation therapy    Right breast -08/20/20-10/04/20- Dr. JGery Pray  Hx of cold sores    Hypertension 2019   Joint pain    Macular degeneration    Palpitations    Personal history of radiation therapy    Sciatica    SVT (supraventricular tachycardia) 07/2013   strees test- normal   Past Surgical History:  Procedure Laterality Date   appendectomy     APPENDECTOMY  1921   AUGMENTATION MAMMAPLASTY Bilateral    implants removed 2022   BREAST BIOPSY Bilateral 03/28/2020   lt, rt x3   BREAST LUMPECTOMY Right 05/02/2020   BREAST LUMPECTOMY WITH RADIOACTIVE SEED LOCALIZATION Right 05/02/2020   Procedure: RIGHT BREAST LUMPECTOMY WITH RADIOACTIVE SEED LOCALIZATION X 2;  Surgeon: BStark Klein MD;  Location: [place];  Service: General;  Laterality: Right;   EYE SURGERY Bilateral 2022   cataract surgery   MASTOPEXY Bilateral 07/11/2020   Procedure: BILATERAL MASTOPEXY AND REMOVAL OF IMPLANTS WITH COMPLETE CAPSULECTOMY;  Surgeon: Wallace Going, DO;  Location: [facility];  Service: Plastics;  Laterality: Bilateral;   RE-EXCISION OF BREAST LUMPECTOMY Right 07/11/2020   Procedure: RE-EXCISION OF RIGHT BREAST LUMPECTOMY;  Surgeon: [name], MD;  Location: [place];  Service: General;  Laterality: Right;   Family History  Problem Relation Age of Onset    Depression Mother    Obesity Mother    Hypertension Father    Hyperlipidemia Father    Heart attack Father    Stroke Father    Heart disease Father    Liver cancer Sister    Bladder Cancer Brother    Multiple myeloma Brother    Social History   Socioeconomic History   Marital status: Widowed    Spouse name: Not on file   Number of children: Not on file   Years of education: Not on file   Highest education level: Not on file  Occupational History   Occupation: PRN RN   Tobacco Use   Smoking status: Former    Packs/day: 0.00    Years: 20.00    Total pack years: 0.00    Types: Cigarettes    Quit date: 10/03/2007    Years since quitting: 14.0   Smokeless tobacco: Never  Vaping Use   Vaping Use: Never used  Substance and Sexual Activity   Alcohol use: Yes    Alcohol/week: 4.0 - 5.0 standard drinks of alcohol    Types: 4 - 5 Glasses of wine per week    Comment: occasional   Drug use: No   Sexual activity: Not Currently    Birth control/protection: Post-menopausal  Other Topics Concern   Not on file  Social History Narrative   Not on file   Social Determinants of Health   Financial Resource Strain: Low Risk  (10/31/2021)   Overall Financial Resource Strain (CARDIA)    Difficulty of Paying Living Expenses: Not hard at all  Food Insecurity: No Food Insecurity (08/13/2020)   Hunger Vital Sign    Worried About Running Out of Food in the Last Year: Never true    Ran Out of Food in the Last Year: Never true  Transportation Needs: No Transportation Needs (10/31/2021)   PRAPARE - Hydrologist (Medical): No    Lack of Transportation (Non-Medical): No  Physical Activity: Insufficiently Active (10/31/2021)   Exercise Vital Sign    Days of Exercise per Week: 4 days    Minutes of Exercise per Session: 30 min  Stress: No Stress Concern Present (10/31/2021)   [place]    Feeling of  Stress : Not at all  Social Connections: Socially Isolated (10/31/2021)   Social Connection and Isolation Panel [NHANES]    Frequency of Communication with Friends and Family: More than three times a week    Frequency of Social Gatherings with Friends and Family: More than three times a week    Attends Religious Services: Never    Marine scientist or Organizations: No    Attends Archivist Meetings: Never    Marital Status: Widowed    Tobacco Counseling Counseling given: Not Answered   Clinical Intake:  Diabetic?  no         Activities of Daily Living    10/31/2021   12:15 PM 12/31/2020    9:58 AM  In your present state of health, do you have any difficulty performing the following activities:  Hearing? 0 0  Vision? 1 0  Difficulty concentrating or making decisions? 0 0  Walking or climbing stairs? 0 0  Dressing or bathing? 0 0  Doing errands, shopping? 0 0  Preparing Food and eating ? N   Using the Toilet? N   In the past six months, have you accidently leaked urine? N   Do you have problems with loss of bowel control? N   Managing your Medications? N   Managing your Finances? N   Housekeeping or managing your Housekeeping? N     Patient Care Team: Valerie Roys, DO as PCP - General (Family Medicine) [name], MD as Consulting Physician (General Surgery) Gery Pray, MD as Consulting Physician (Radiation Oncology) Dillingham, Loel Lofty, DO as Attending Physician (Plastic Surgery)  Indicate any recent Medical Services you may have received from other than [facility] providers in the past year (date may be approximate).     Assessment:   This is a routine wellness examination for Georgean.  Hearing/Vision screen Hearing Screening - Comments:: No trouble hearing Vision Screening - Comments:: Macular degermation Dr. Katy Fitch  Dietary issues and exercise activities discussed: Current Exercise Habits: Home exercise  routine, Time (Minutes): 30, Frequency (Times/Week): 5, Weekly Exercise (Minutes/Week): 150, Intensity: Mild   Goals Addressed             This Visit's Progress    Weight (lb) < 200 lb (90.7 kg)         Depression Screen    10/31/2021   12:10 PM 07/04/2021    4:22 PM 12/31/2020   10:03 AM 08/13/2020    1:46 PM 06/28/2020    7:38 AM 04/27/2019    8:23 AM 05/30/2016    1:54 PM  PHQ 2/9 Scores  PHQ - 2 Score 0 0 0 0 6 0 0  PHQ- 9 Score _0 Fall Risk    10/31/2021   12:08 PM 07/04/2021    4:22 PM 12/31/2020    9:58 AM 08/13/2020    1:46 PM 04/27/2019    8:21 AM  [place] in the past year? 0 0 0 0 0  Number falls in past yr: 0 0 0  0  Injury with Fall? 0 0 0  0  Risk for fall due to :  No Fall Risks No Fall Risks Medication side effect   Follow up Falls evaluation completed;Education provided;Falls prevention discussed Falls evaluation completed Falls evaluation completed Falls evaluation completed;Education provided;Falls prevention discussed     FALL RISK PREVENTION PERTAINING TO THE HOME:  Any stairs in or around the home? Yes  If so, are there any without handrails? No  Home free of loose throw rugs in walkways, pet beds, electrical cords, etc? Yes  Adequate lighting in your home to reduce risk of falls? Yes   ASSISTIVE DEVICES UTILIZED TO PREVENT FALLS:  Life alert? No  Use of a cane, walker or w/c? No  Grab bars in the bathroom? No  Shower chair or bench in shower? Yes  Elevated toilet seat or a handicapped toilet? No   TIMED UP AND GO:  Was the test performed? No .    Cognitive  Function:        10/31/2021   12:06 PM 08/13/2020    1:47 PM 05/30/2016    2:18 PM  6CIT Screen  What Year? 0 points 0 points 0 points  What month? 0 points 0 points 0 points  What time? 0 points 0 points 0 points  Count back from 20 0 points 2 points 0 points  Months in reverse 0 points 0 points 0 points  Repeat phrase 0 points 0 points 0 points  Total  Score 0 points 2 points 0 points    Immunizations Immunization History  Administered Date(s) Administered   Fluad Quad(high Dose 65+) 10/25/2021   Influenza,inj,Quad PF,6+ Mos 10/13/2020   Influenza-Unspecified 09/28/2014, 10/24/2019   PFIZER(Purple Top)SARS-COV-2 Vaccination 01/04/2019, 01/25/2019   Pfizer Covid-19 Vaccine Bivalent Booster 83yr & up 11/06/2020   Pneumococcal Conjugate-13 10/28/2013   Pneumococcal Polysaccharide-23 09/27/2018   Tdap 01/13/2018    TDAP status: Up to date  Flu Vaccine status: Up to date  Pneumococcal vaccine status: Up to date  Covid-19 vaccine status: Information provided on how to obtain vaccines.   Qualifies for Shingles Vaccine? Yes   Zostavax completed No   Shingrix Completed?: No.    Education has been provided regarding the importance of this vaccine. Patient has been advised to call insurance company to determine out of pocket expense if they have not yet received this vaccine. Advised may also receive vaccine at local pharmacy or Health Dept. Verbalized acceptance and understanding.  Screening Tests Health Maintenance  Topic Date Due   COVID-19 Vaccine (4 - Pfizer risk series) 11/16/2021 (Originally 01/01/2021)   Zoster Vaccines- Shingrix (1 of 2) 01/31/2022 (Originally 07/21/1969)   MAMMOGRAM  03/22/2023   COLONOSCOPY (Pts 45-453yrInsurance coverage will need to be confirmed)  12/14/2023   TETANUS/TDAP  01/14/2028   Pneumonia Vaccine 6573Years old  Completed   INFLUENZA VACCINE  Completed   DEXA SCAN  Completed   Hepatitis C Screening  Completed   HPV VACCINES  Aged Out    Health Maintenance  There are no preventive care reminders to display for this patient.   Colorectal cancer screening: Type of screening: Colonoscopy. Completed 2015. Repeat every 10 years  Mammogram status: Completed  . Repeat every year  Bone Density scheduled 03-2022  Lung Cancer Screening: (Low Dose CT Chest recommended if Age 73-80ears, 30  pack-year currently smoking OR have quit w/in 15years.) does not qualify.   Lung Cancer Screening Referral:   Additional Screening:  Hepatitis C Screening: does not qualify; Completed 2021  Vision Screening: Recommended annual ophthalmology exams for early detection of glaucoma and other disorders of the eye. Is the patient up to date with their annual eye exam?  Yes  Who is the provider or what is the name of the office in which the patient attends annual eye exams? [facility] If pt is not established with a provider, would they like to be referred to a provider to establish care? No .   Dental Screening: Recommended annual dental exams for proper oral hygiene  Community Resource Referral / Chronic Care Management: CRR required this visit?  No   CCM required this visit?  No      Plan:     I have personally reviewed and noted the following in the patient's chart:   Medical and social history Use of alcohol, tobacco or illicit drugs  Current medications and supplements including opioid prescriptions. Patient is not currently taking opioid prescriptions. Functional ability and status  Nutritional status Physical activity Advanced directives List of other physicians Hospitalizations, surgeries, and ER visits in previous 12 months Vitals Screenings to include cognitive, depression, and falls Referrals and appointments  In addition, I have reviewed and discussed with patient certain preventive protocols, quality metrics, and best practice recommendations. A written personalized care plan for preventive services as well as general preventive health recommendations were provided to patient.     Leroy Kennedy, LPN   [id number]   Nurse Notes:

## 2021-11-07 ENCOUNTER — Other Ambulatory Visit: Payer: Self-pay

## 2021-11-07 MED ORDER — COVID-19 MRNA 2023-2024 VACCINE (COMIRNATY) 0.3 ML INJECTION
0.3000 mL | Freq: Once | INTRAMUSCULAR | 0 refills | Status: AC
  Filled 2021-11-07 – 2021-11-11 (×2): qty 0.3, 1d supply, fill #0

## 2021-11-11 ENCOUNTER — Other Ambulatory Visit: Payer: Self-pay

## 2021-11-18 ENCOUNTER — Other Ambulatory Visit: Payer: Self-pay

## 2021-11-18 ENCOUNTER — Encounter (HOSPITAL_BASED_OUTPATIENT_CLINIC_OR_DEPARTMENT_OTHER): Payer: Self-pay

## 2021-11-18 ENCOUNTER — Emergency Department (HOSPITAL_BASED_OUTPATIENT_CLINIC_OR_DEPARTMENT_OTHER): Payer: PRIVATE HEALTH INSURANCE | Admitting: Radiology

## 2021-11-18 ENCOUNTER — Emergency Department (HOSPITAL_BASED_OUTPATIENT_CLINIC_OR_DEPARTMENT_OTHER)
Admission: EM | Admit: 2021-11-18 | Discharge: 2021-11-18 | Disposition: A | Discharge status: Home / Self Care | Payer: PRIVATE HEALTH INSURANCE | Attending: Emergency Medicine | Admitting: Emergency Medicine

## 2021-11-18 DIAGNOSIS — M79641 Pain in right hand: Secondary | ICD-10-CM | POA: Insufficient documentation

## 2021-11-18 DIAGNOSIS — Y9301 Activity, walking, marching and hiking: Secondary | ICD-10-CM | POA: Diagnosis not present

## 2021-11-18 DIAGNOSIS — Z79899 Other long term (current) drug therapy: Secondary | ICD-10-CM | POA: Insufficient documentation

## 2021-11-18 DIAGNOSIS — Z853 Personal history of malignant neoplasm of breast: Secondary | ICD-10-CM | POA: Diagnosis not present

## 2021-11-18 DIAGNOSIS — Y990 Civilian activity done for income or pay: Secondary | ICD-10-CM | POA: Insufficient documentation

## 2021-11-18 DIAGNOSIS — M79642 Pain in left hand: Secondary | ICD-10-CM | POA: Diagnosis not present

## 2021-11-18 DIAGNOSIS — M25532 Pain in left wrist: Secondary | ICD-10-CM | POA: Insufficient documentation

## 2021-11-18 DIAGNOSIS — I10 Essential (primary) hypertension: Secondary | ICD-10-CM | POA: Diagnosis not present

## 2021-11-18 DIAGNOSIS — R0789 Other chest pain: Secondary | ICD-10-CM | POA: Diagnosis not present

## 2021-11-18 DIAGNOSIS — W19XXXA Unspecified fall, initial encounter: Secondary | ICD-10-CM

## 2021-11-18 DIAGNOSIS — W0110XA Fall on same level from slipping, tripping and stumbling with subsequent striking against unspecified object, initial encounter: Secondary | ICD-10-CM | POA: Insufficient documentation

## 2021-11-18 DIAGNOSIS — Z043 Encounter for examination and observation following other accident: Secondary | ICD-10-CM | POA: Diagnosis not present

## 2021-11-18 MED ORDER — HYDROCODONE-ACETAMINOPHEN 5-325 MG PO TABS
2.0000 | ORAL_TABLET | ORAL | 0 refills | Status: DC | PRN
  Filled 2021-11-18: qty 7, 1d supply, fill #0

## 2021-11-18 MED ORDER — HYDROCODONE-ACETAMINOPHEN 5-325 MG PO TABS
1.0000 | ORAL_TABLET | Freq: Once | ORAL | Status: AC
  Administered 2021-11-18: 1 via ORAL
  Filled 2021-11-18: qty 1

## 2021-11-18 NOTE — Discharge Instructions (Signed)
Note the work-up today was overall reassuring.  No evidence of fracture.  As discussed, treatment to be continued with rest, ice, elevation, NSAIDs such as Tylenol/Aleve for pain/inflammation.  I will prescribe a short course of pain medicine to take as needed.  Recommend follow-up with orthopedics in 7 to 10 days for reevaluation of your symptoms.  Please do not hesitate to return to the emergency department if the worrisome signs symptoms we discussed become apparent.

## 2021-11-18 NOTE — ED Triage Notes (Signed)
Pt fell while walking out of work. Pt tripped over a root and hit her L face, L arm/hand, and L ribs. No thinners, no LOC, did not hit head. Pt ambulatory to triage.

## 2021-11-18 NOTE — ED Provider Notes (Signed)
[place] EMERGENCY DEPT Provider Note   CSN: [id number] Arrival date & time: 11/18/21  1729     History  Chief Complaint  Patient presents with   Fall   Arm Injury   Rib Injury    Kimberly Klein is a 71 y.o. female.   Fall  Arm Injury   71 year old female presents emergency department with complaints of fall.  Patient states that she was walking out from work when she tripped over a root landing on her left side.  Reports trauma to the left side of her face but denies loss of consciousness or blood thinner use.  She is currently complaining of left wrist, left hand as well as right hand and left rib pain.  Patient states that she has been able to ambulate since the incident without difficulty.  Denies visual deficits, slurred speech, facial droop, gait abnormalities, weakness or sensory deficits in affected extremities.  Patient is taken no medication for this.  Denies presyncopal type symptoms, shortness of breath  .  Past medical history significant for hypertension, breast cancer, palpitations, sciatica  Home Medications Prior to Admission medications   Medication Sig Start Date End Date Taking? Authorizing Provider  amLODipine (NORVASC) 5 MG tablet Take 1 tablet (5 mg total) by mouth daily. TAKE 1 TABLET(5 MG) BY MOUTH DAILY 07/04/21   Park Liter P, DO  anastrozole (ARIMIDEX) 1 MG tablet Take 1 tablet (1 mg total) by mouth daily. 09/17/21   Benay Pike, MD  lisinopril (ZESTRIL) 20 MG tablet TAKE 1 TABLET(20 MG) BY MOUTH DAILY 07/04/21   Johnson, Megan P, DO  Multiple Vitamins-Minerals (PRESERVISION AREDS 2+MULTI VIT PO) Take 1 capsule by mouth in the morning and at bedtime.    [provider]  Semaglutide, 1 MG/DOSE, (OZEMPIC, 1 MG/DOSE,) 4 MG/3ML SOPN Inject 1 mg into the skin once a week. 07/01/21   Everardo Pacific, FNP  Vitamin D, Ergocalciferol, (DRISDOL) 1.25 MG (50000 UNIT) CAPS capsule Take 1 capsule (50,000 Units total) by mouth  every 7 (seven) days. 07/01/21   Everardo Pacific, [place]      Allergies    Patient has no known allergies.    Review of Systems   Review of Systems  All other systems reviewed and are negative.   Physical Exam Updated Vital Signs BP (!) 163/93   Pulse 84   Temp 98 F (36.7 C) (Oral)   Resp 18   Ht '5\' 2"'$  (1.575 m)   Wt 72.6 kg   SpO2 100%   BMI 29.26 kg/m  Physical Exam Vitals and nursing note reviewed.  Constitutional:      General: She is not in acute distress.    Appearance: She is well-developed.  HENT:     Head: Normocephalic and atraumatic.     Right Ear: Tympanic membrane normal.     Left Ear: Tympanic membrane normal.  Eyes:     Conjunctiva/sclera: Conjunctivae normal.  Cardiovascular:     Rate and Rhythm: Normal rate and regular rhythm.     Heart sounds: No murmur heard. Pulmonary:     Effort: Pulmonary effort is normal. No respiratory distress.     Breath sounds: Normal breath sounds.  Abdominal:     Palpations: Abdomen is soft.     Tenderness: There is no abdominal tenderness.  Musculoskeletal:     Cervical back: Neck supple.     Right lower leg: No edema.     Left lower leg: No edema.     Comments:  Patient has swelling noted on dorsal aspect of left wrist and left hand.  Patient tender to palpation of fourth and fifth metacarpal bones on the left hand.  No anatomical snuffbox tenderness.  No obvious breaks in skin noted.  Patient able to make fist but with pain.  Complaining of no sensation loss distally.  Radial pulses full and intact bilaterally.  Capillary fill less than 2 seconds.  Patient has tenderness to palpation of left lateral ribs.  No obvious palpable abnormality.  No obvious crepitus.  No tenderness palpation midline cervical, thoracic, lumbar C-spine.  No anterior chest wall tenderness.  No tenderness palpation of lower extremities.  Skin:    General: Skin is warm and dry.     Capillary Refill: Capillary refill takes less than 2 seconds.   Neurological:     Mental Status: She is alert.     Comments: Alert and oriented to self, place, time and event.   Speech is fluent, clear without dysarthria or dysphasia.   Strength 5/5 in upper/lower extremities   Sensation intact in upper/lower extremities   Normal gait.  Negative Romberg. No pronator drift.  Normal finger-to-nose and feet tapping.  CN I not tested  CN II grossly intact visual fields bilaterally. Did not visualize posterior eye.  CN III, IV, VI PERRLA and EOMs intact bilaterally  CN V Intact sensation to sharp and light touch to the face  CN VII facial movements symmetric  CN VIII not tested  CN IX, X no uvula deviation, symmetric rise of soft palate  CN XI 5/5 SCM and trapezius strength bilaterally  CN XII Midline tongue protrusion, symmetric L/R movements   Psychiatric:        Mood and Affect: Mood normal.     ED Results / Procedures / Treatments   Labs (all labs ordered are listed, but only abnormal results are displayed) Labs Reviewed - No data to display  EKG None  Radiology DG Wrist Complete Left  Result Date: 11/18/2021 CLINICAL DATA:  Ground level fall EXAM: LEFT WRIST - COMPLETE 3+ VIEW; LEFT HAND - COMPLETE 3+ VIEW COMPARISON:  None Available. FINDINGS: No acute fracture or dislocation in the left wrist or hand. 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 No significant soft tissue abnormality. IMPRESSION: No acute fracture or dislocation. Electronically Signed   By: Placido Sou M.D.   On: 11/18/2021 19:23   DG Hand Complete Left  Result Date: 11/18/2021 CLINICAL DATA:  Ground level fall EXAM: LEFT WRIST - COMPLETE 3+ VIEW; LEFT HAND - COMPLETE 3+ VIEW COMPARISON:  None Available. FINDINGS: No acute fracture or dislocation in the left wrist or hand. 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 No significant soft tissue abnormality. IMPRESSION: No acute fracture or dislocation. Electronically Signed   By: Placido Sou M.D.   On:  11/18/2021 19:23   DG Chest 2 View  Result Date: 11/18/2021 CLINICAL DATA:  Recent fall EXAM: CHEST - 2 VIEW COMPARISON:  Chest x-ray July 18, 2013 FINDINGS: The cardiomediastinal silhouette is unchanged in contour. No focal pulmonary opacity. No pleural effusion or pneumothorax. Visualized upper abdomen is unremarkable. No acute osseous abnormality. IMPRESSION: No acute cardiopulmonary abnormality. Electronically Signed   By: Beryle Flock M.D.   On: 11/18/2021 19:20    Procedures Procedures    Medications Ordered in ED Medications  HYDROcodone-acetaminophen (NORCO/VICODIN) 5-325 MG per tablet 1 tablet (1 tablet Oral Given 11/18/21 2151)    ED Course/ Medical Decision Making/ A&P  Medical Decision Making Amount and/or Complexity of Data Reviewed Radiology: ordered.  Risk Prescription drug management.   This patient presents to the ED for concern of fall, this involves an extensive number of treatment options, and is a complaint that carries with it a high risk of complications and morbidity.  The differential diagnosis includes fracture, strain/sprain, dislocation, CVA, spinal cord damage thought of anemia, flail chest, pneumothorax   Co morbidities that complicate the patient evaluation  See HPI   Additional history obtained:  Additional history obtained from EMR External records from outside source obtained and reviewed including hospital records   Lab Tests:  N/a   Imaging Studies ordered:  I ordered imaging studies including chest x-ray, left hand x-ray, left wrist x-ray I independently visualized and interpreted imaging which showed  Chest x-ray: No acute abnormalities Left wrist/left hand x-ray: No acute abnormalities I agree with the radiologist interpretation   Cardiac Monitoring: / EKG:  The patient was maintained on a cardiac monitor.  I personally viewed and interpreted the cardiac monitored which showed an underlying  rhythm of: Sinus rhythm   Consultations Obtained:  N/a   Problem List / ED Course / Critical interventions / Medication management  Fall I ordered medication including Norco for pain   Reevaluation of the patient after these medicines showed that the patient improved I have reviewed the patients home medicines and have made adjustments as needed   Social Determinants of Health:  Denies tobacco, illicit drug use   Test / Admission - Considered:  Fall Vitals signs significant for hypertension with a blood pressure 163/93.  Recommend close follow-up with PCP regarding elevation blood pressure.. Otherwise within normal range and stable throughout visit. Laboratory/imaging studies significant for: See above Patient's fall described as mechanical in nature.  Patient neurovascular intact left upper extremity.  No obvious signs of flail chest.  Patient's x-rays negative for any acute bony abnormalities.  Patient's left wrist placed in splint.  Recommended symptomatic therapy at home with rest, ice, elevation, NSAIDs/Tylenol as needed for pain.  Recommend evaluation by orthopedics and 5 to 7 days.  Treatment plan discussed with patient she knowledge understanding was agreeable to said plan. Worrisome signs and symptoms were discussed with the patient, and the patient acknowledged understanding to return to the ED if noticed. Patient was stable upon discharge.          Final Clinical Impression(s) / ED Diagnoses Final diagnoses:  Fall, initial encounter  Left wrist pain    Rx / DC Orders ED Discharge Orders     None         Wilnette Kales, [place] 11/18/21 2202    Tretha Sciara, MD 11/20/21 2157

## 2021-11-19 ENCOUNTER — Other Ambulatory Visit: Payer: Self-pay

## 2021-11-28 ENCOUNTER — Other Ambulatory Visit: Payer: Self-pay | Admitting: Physician Assistant

## 2021-11-28 ENCOUNTER — Ambulatory Visit
Admission: RE | Admit: 2021-11-28 | Discharge: 2021-11-28 | Disposition: A | Discharge status: Home / Self Care | Payer: PRIVATE HEALTH INSURANCE | Source: Ambulatory Visit | Attending: Physician Assistant | Admitting: Physician Assistant

## 2021-11-28 DIAGNOSIS — Y990 Civilian activity done for income or pay: Secondary | ICD-10-CM

## 2021-11-28 DIAGNOSIS — M79642 Pain in left hand: Secondary | ICD-10-CM | POA: Insufficient documentation

## 2021-11-28 DIAGNOSIS — M25532 Pain in left wrist: Secondary | ICD-10-CM | POA: Insufficient documentation

## 2021-11-28 DIAGNOSIS — M79641 Pain in right hand: Secondary | ICD-10-CM | POA: Diagnosis not present

## 2021-11-28 DIAGNOSIS — M7989 Other specified soft tissue disorders: Secondary | ICD-10-CM | POA: Diagnosis not present

## 2021-11-28 DIAGNOSIS — W19XXXA Unspecified fall, initial encounter: Secondary | ICD-10-CM | POA: Insufficient documentation

## 2021-11-28 DIAGNOSIS — S62347A Nondisplaced fracture of base of fifth metacarpal bone. left hand, initial encounter for closed fracture: Secondary | ICD-10-CM | POA: Insufficient documentation

## 2022-01-03 ENCOUNTER — Encounter: Payer: PPO | Admitting: Family Medicine

## 2022-01-17 ENCOUNTER — Telehealth: Payer: Self-pay | Admitting: Adult Health

## 2022-01-17 NOTE — Telephone Encounter (Signed)
Rescheduled appointment per provider template. Left voicemail.

## 2022-01-22 ENCOUNTER — Ambulatory Visit: Payer: PPO | Admitting: Hematology and Oncology

## 2022-01-23 ENCOUNTER — Inpatient Hospital Stay: Payer: PPO | Attending: Hematology and Oncology | Admitting: Adult Health

## 2022-01-24 ENCOUNTER — Telehealth: Payer: Self-pay | Admitting: Adult Health

## 2022-01-24 NOTE — Telephone Encounter (Signed)
Per 1/11 IB, called but they do not want to r/s at this time, told pt to call back if they want to r/s

## 2022-01-30 ENCOUNTER — Other Ambulatory Visit: Payer: Self-pay

## 2022-01-30 ENCOUNTER — Other Ambulatory Visit: Payer: Self-pay | Admitting: Family Medicine

## 2022-01-30 MED FILL — Amlodipine Besylate Tab 5 MG (Base Equivalent): ORAL | 30 days supply | Qty: 30 | Fill #0 | Status: AC

## 2022-01-30 NOTE — Telephone Encounter (Signed)
Called pt - [id number] for appointment. 

## 2022-01-30 NOTE — Telephone Encounter (Signed)
Courtesy refill. Patient will need an office visit for further refills. Requested Prescriptions  Pending Prescriptions Disp Refills   amLODipine (NORVASC) 5 MG tablet 30 tablet 0    Sig: Take 1 tablet (5 mg total) by mouth daily. TAKE 1 TABLET(5 MG) BY MOUTH DAILY     Cardiovascular: Calcium Channel Blockers 2 Passed - 01/30/2022  9:23 AM      Passed - Last BP in normal range    BP Readings from Last 1 Encounters:  11/18/21 132/77         Passed - Last Heart Rate in normal range    Pulse Readings from Last 1 Encounters:  11/18/21 74         Passed - Valid encounter within last 6 months    Recent Outpatient Visits           7 months ago Prediabetes, with polyphagia   [place], Megan P, DO   1 year ago Routine general medical examination at a health care facility   [place], [place], DO   1 year ago Primary hypertension   [place], [place], DO   2 years ago Essential hypertension   [place], [place], DO   2 years ago Essential hypertension   [place], [place], DO

## 2022-01-31 ENCOUNTER — Other Ambulatory Visit: Payer: Self-pay

## 2022-02-13 ENCOUNTER — Other Ambulatory Visit: Payer: Self-pay

## 2022-02-13 MED ORDER — MELOXICAM 7.5 MG PO TABS
7.5000 mg | ORAL_TABLET | Freq: Every day | ORAL | 1 refills | Status: DC
  Filled 2022-02-13 – 2022-02-27 (×2): qty 30, 30d supply, fill #0

## 2022-02-23 ENCOUNTER — Other Ambulatory Visit: Payer: Self-pay

## 2022-02-27 ENCOUNTER — Other Ambulatory Visit: Payer: Self-pay

## 2022-03-03 ENCOUNTER — Other Ambulatory Visit: Payer: Self-pay | Admitting: Hematology and Oncology

## 2022-03-03 DIAGNOSIS — R928 Other abnormal and inconclusive findings on diagnostic imaging of breast: Secondary | ICD-10-CM

## 2022-03-31 ENCOUNTER — Other Ambulatory Visit (HOSPITAL_BASED_OUTPATIENT_CLINIC_OR_DEPARTMENT_OTHER): Payer: Self-pay

## 2022-04-04 ENCOUNTER — Other Ambulatory Visit: Payer: Self-pay | Admitting: Hematology and Oncology

## 2022-04-04 ENCOUNTER — Ambulatory Visit
Admission: RE | Admit: 2022-04-04 | Discharge: 2022-04-04 | Disposition: A | Discharge status: Home / Self Care | Payer: PPO | Source: Ambulatory Visit | Attending: Hematology and Oncology | Admitting: Hematology and Oncology

## 2022-04-04 DIAGNOSIS — R921 Mammographic calcification found on diagnostic imaging of breast: Secondary | ICD-10-CM

## 2022-04-04 DIAGNOSIS — R928 Other abnormal and inconclusive findings on diagnostic imaging of breast: Secondary | ICD-10-CM

## 2022-04-04 DIAGNOSIS — D0511 Intraductal carcinoma in situ of right breast: Secondary | ICD-10-CM

## 2022-04-04 DIAGNOSIS — Z853 Personal history of malignant neoplasm of breast: Secondary | ICD-10-CM | POA: Diagnosis not present

## 2022-04-04 DIAGNOSIS — Z780 Asymptomatic menopausal state: Secondary | ICD-10-CM | POA: Diagnosis not present

## 2022-04-04 DIAGNOSIS — M810 Age-related osteoporosis without current pathological fracture: Secondary | ICD-10-CM | POA: Diagnosis not present

## 2022-04-07 ENCOUNTER — Telehealth: Payer: Self-pay | Admitting: Hematology and Oncology

## 2022-04-07 NOTE — Telephone Encounter (Signed)
Left patient a vm regarding upcoming appointment  

## 2022-04-08 ENCOUNTER — Other Ambulatory Visit: Payer: Self-pay

## 2022-04-08 MED ORDER — MELOXICAM 15 MG PO TABS
15.0000 mg | ORAL_TABLET | Freq: Every day | ORAL | 1 refills | Status: DC
  Filled 2022-04-08: qty 30, 30d supply, fill #0

## 2022-04-14 ENCOUNTER — Ambulatory Visit
Admission: RE | Admit: 2022-04-14 | Discharge: 2022-04-14 | Disposition: A | Discharge status: Home / Self Care | Payer: PPO | Source: Ambulatory Visit | Attending: Hematology and Oncology | Admitting: Hematology and Oncology

## 2022-04-14 DIAGNOSIS — R928 Other abnormal and inconclusive findings on diagnostic imaging of breast: Secondary | ICD-10-CM

## 2022-04-14 DIAGNOSIS — R921 Mammographic calcification found on diagnostic imaging of breast: Secondary | ICD-10-CM

## 2022-04-14 DIAGNOSIS — C50812 Malignant neoplasm of overlapping sites of left female breast: Secondary | ICD-10-CM

## 2022-04-14 DIAGNOSIS — D0512 Intraductal carcinoma in situ of left breast: Secondary | ICD-10-CM | POA: Diagnosis not present

## 2022-04-14 HISTORY — PX: BREAST BIOPSY: SHX20

## 2022-04-14 HISTORY — DX: Malignant neoplasm of overlapping sites of left female breast: C50.812

## 2022-04-17 ENCOUNTER — Other Ambulatory Visit: Payer: Self-pay | Admitting: Family Medicine

## 2022-04-17 NOTE — Telephone Encounter (Signed)
Requested medication (s) are due for refill today:   Yes  Requested medication (s) are on the active medication list:   Yes  Future visit scheduled:   No   Several message have been left for her to call in for an appt.   Last ordered: Courtesy supply 01/30/2022 #30, 0 refills  Per note she needs an OV for more refills.   No response.     Requested Prescriptions  Pending Prescriptions Disp Refills   amLODipine (NORVASC) 5 MG tablet 30 tablet 0    Sig: Take 1 tablet (5 mg total) by mouth daily. TAKE 1 TABLET(5 MG) BY MOUTH DAILY     Cardiovascular: Calcium Channel Blockers 2 Passed - 04/17/2022 12:24 PM      Passed - Last BP in normal range    BP Readings from Last 1 Encounters:  11/18/21 132/77         Passed - Last Heart Rate in normal range    Pulse Readings from Last 1 Encounters:  11/18/21 74         Passed - Valid encounter within last 6 months    Recent Outpatient Visits           9 months ago Prediabetes, with polyphagia   [place], [place], DO   1 year ago Routine general medical examination at a health care facility   [place], [place], DO   2 years ago Primary hypertension   [place], [place], DO   2 years ago Essential hypertension   [place], [place], DO   3 years ago Essential hypertension   [place], [place], DO

## 2022-04-20 ENCOUNTER — Other Ambulatory Visit: Payer: Self-pay

## 2022-04-21 ENCOUNTER — Other Ambulatory Visit: Payer: Self-pay | Admitting: Family Medicine

## 2022-04-21 ENCOUNTER — Other Ambulatory Visit: Payer: Self-pay

## 2022-04-22 ENCOUNTER — Other Ambulatory Visit: Payer: Self-pay

## 2022-04-22 IMAGING — MG MM BREAST LOCALIZATION CLIP
8 series · 8 of 24 positions shown · non-contrast
Comparison: Previous exam(s).

CLINICAL DATA: Status post ultrasound-guided core biopsy of a left
breast mass and two stereotactic biopsies of right breast
calcifications.

EXAM:
3D DIAGNOSTIC BILATERAL MAMMOGRAM POST ULTRASOUND BIOPSY OF THE LEFT
BREAST AND STEREOTACTIC BIOPSY OF THE RIGHT BREAST X2

[R CC synth-2D (1 of 2)]
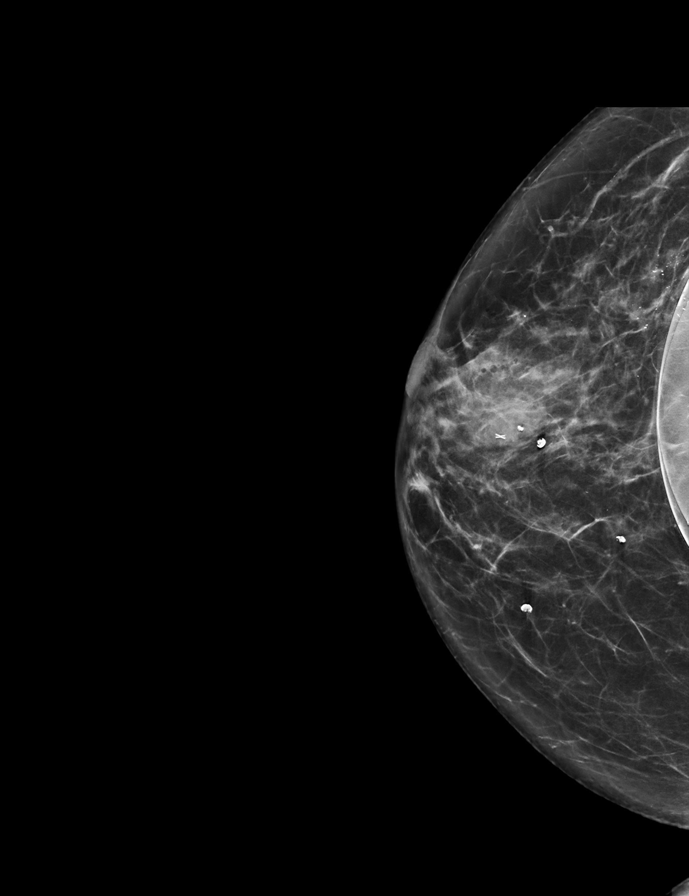

[R CC synth-2D (2 of 2)]
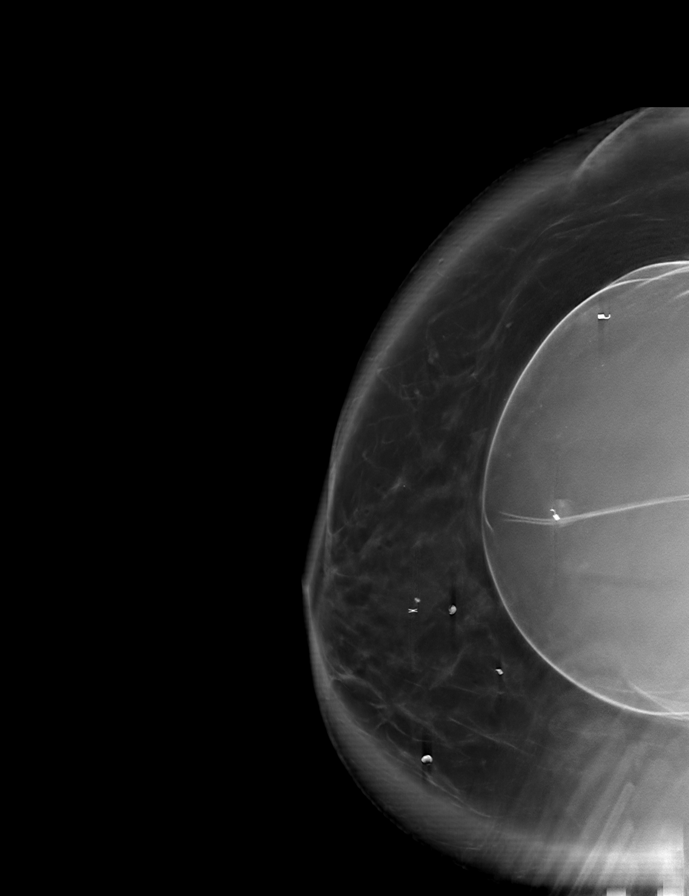

[R LM synth-2D (1 of 2)]
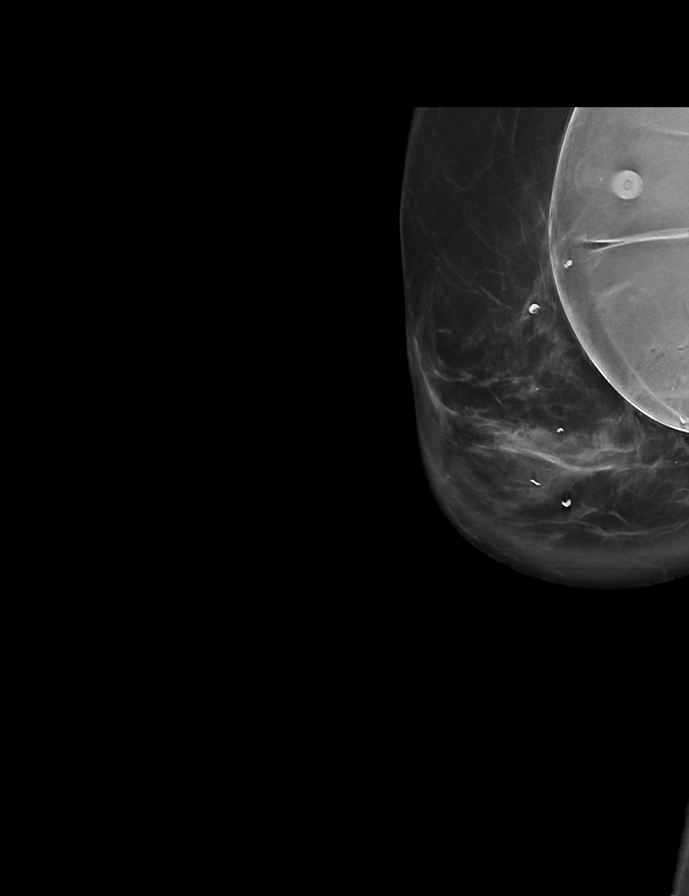

[R LM synth-2D (2 of 2)]
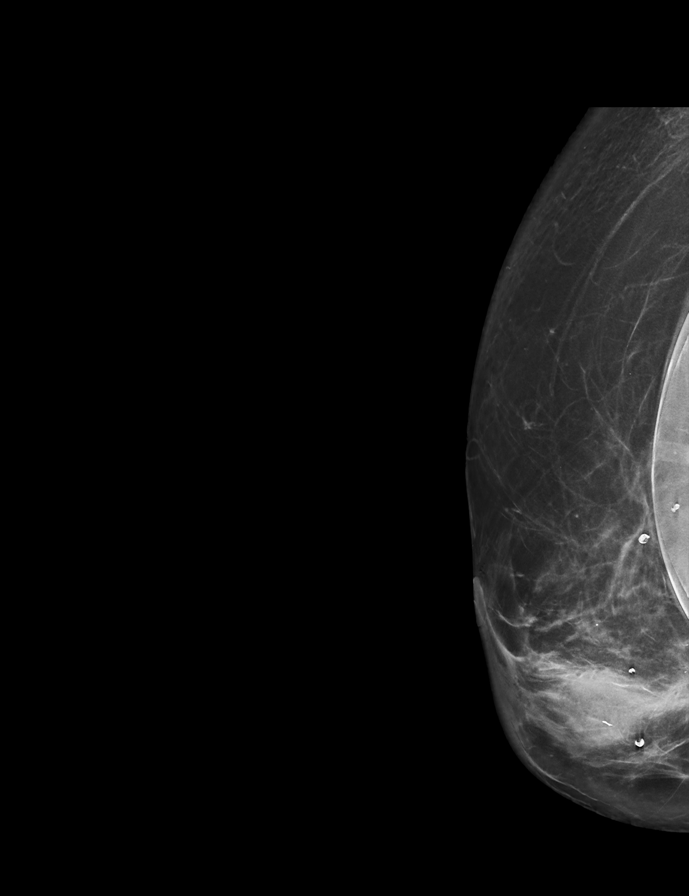

[R CC tomo (1 of 2) · tomo slice 33/65.0]
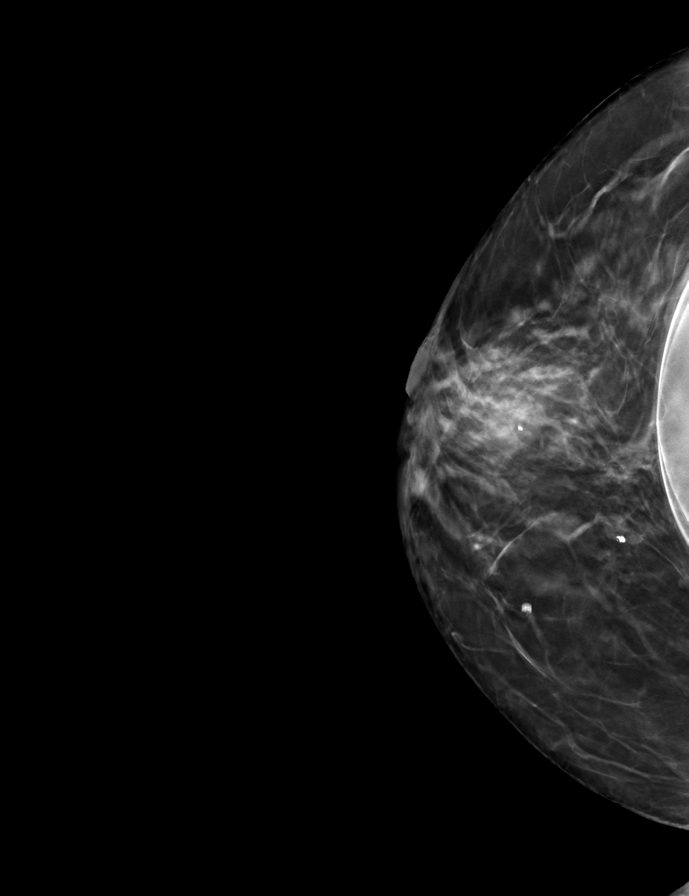

[R LM tomo (1 of 2) · tomo slice 41/81.0]
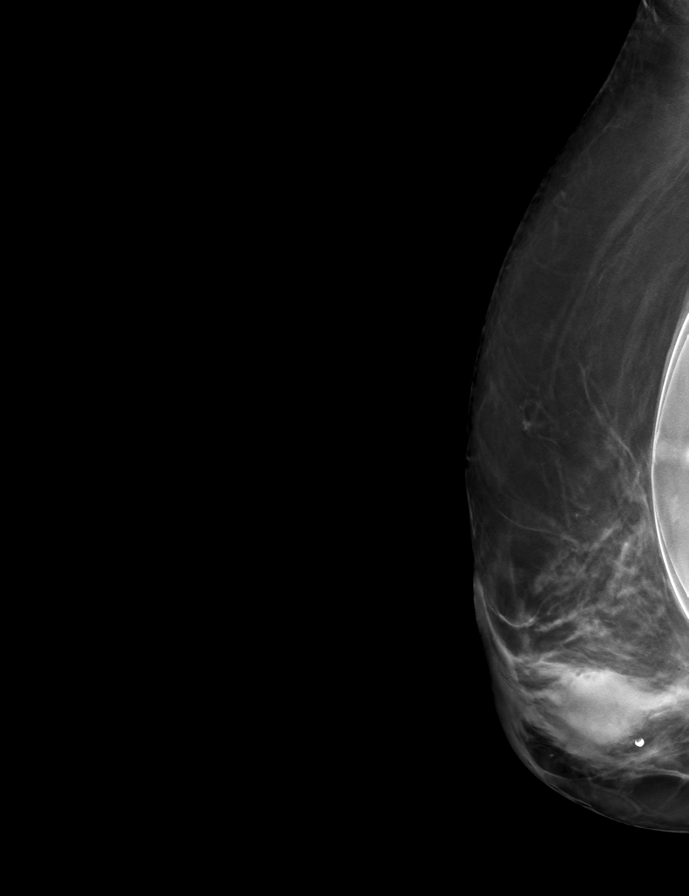

[R LM tomo (2 of 2) · tomo slice 48/95.0]
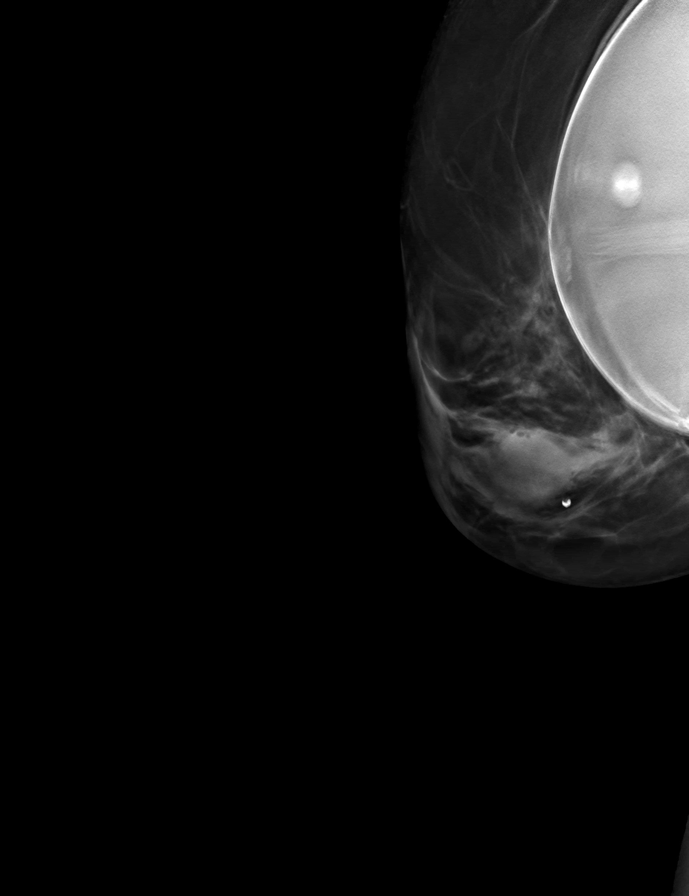

[R CC tomo (2 of 2) · tomo slice 56/111.0]
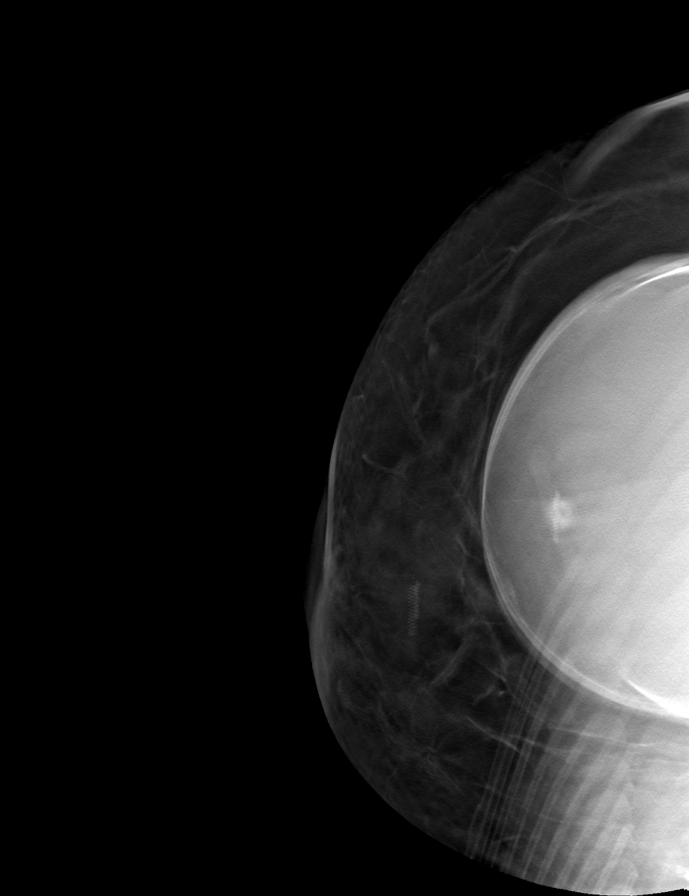

[8 of 24 positions shown; findings below may reference images not displayed]

FINDINGS: 3D Mammographic images were obtained following ultrasound guided
biopsy of the left breast and stereotactic biopsy of the right
breast x2. The ribbon shaped biopsy marker clip in the 12 o'clock
region of the left breast is in appropriate position. The coil
shaped biopsy marker clip in the posterior aspect of the lower outer
quadrant of the right breast and the X shaped clip in the anterior
aspect of the lower outer quadrant of the right breast are in
appropriate position.
IMPRESSION: Appropriate positioning of the ribbon shaped shaped biopsy marking
clip at the site of biopsy in the 12 o'clock region of the left
breast and coil shaped clip in the posterior aspect of the lower
outer quadrant of the right breast and X shaped clip in the anterior
aspect of the lower outer quadrant of the right breast.

The patient developed a moderate size hematoma from the stereotactic
biopsy of the anterior aspect of the lower outer quadrant of the
right breast.

Final Assessment: Post Procedure Mammograms for Marker Placement

## 2022-04-22 IMAGING — MG MM BREAST BX W/ LOC DEV EA AD LESION IMAG BX SPEC STEREO GUIDE*R
6 series · 6 of 6 positions shown · non-contrast
Comparison: Previous exam(s).
COMPARISON: Previous exam(s).

Addendum:
CLINICAL DATA: Left breast mass and right breast calcifications.

EXAM:
ULTRASOUND GUIDED LEFT BREAST CORE NEEDLE BIOPSY AND STEREOTACTIC
GUIDED RIGHT BREAST CORE NEEDLE BIOPSIES X2

[R (1 of 6)]
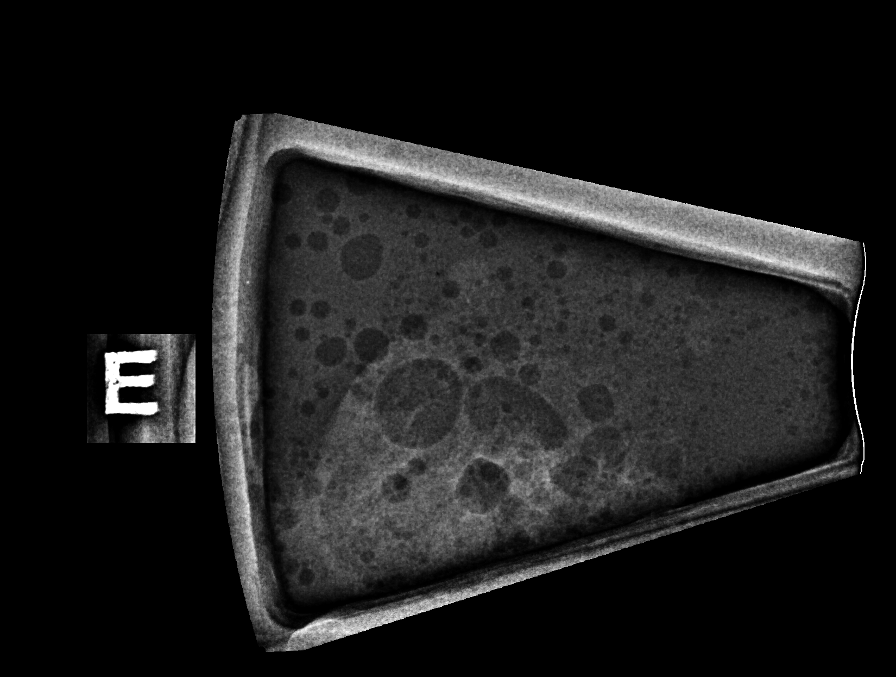

[R (2 of 6)]
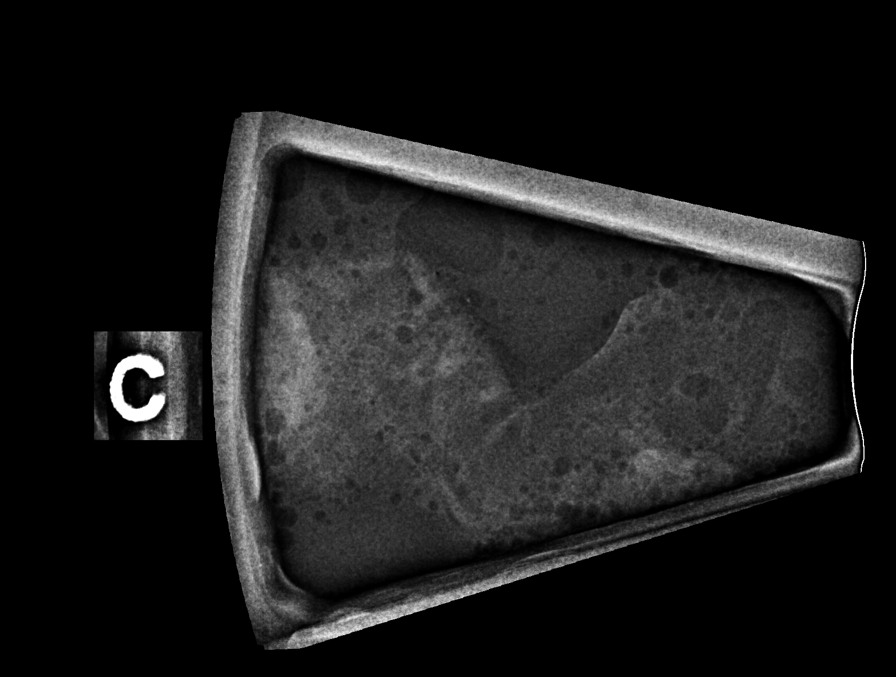

[R (3 of 6)]
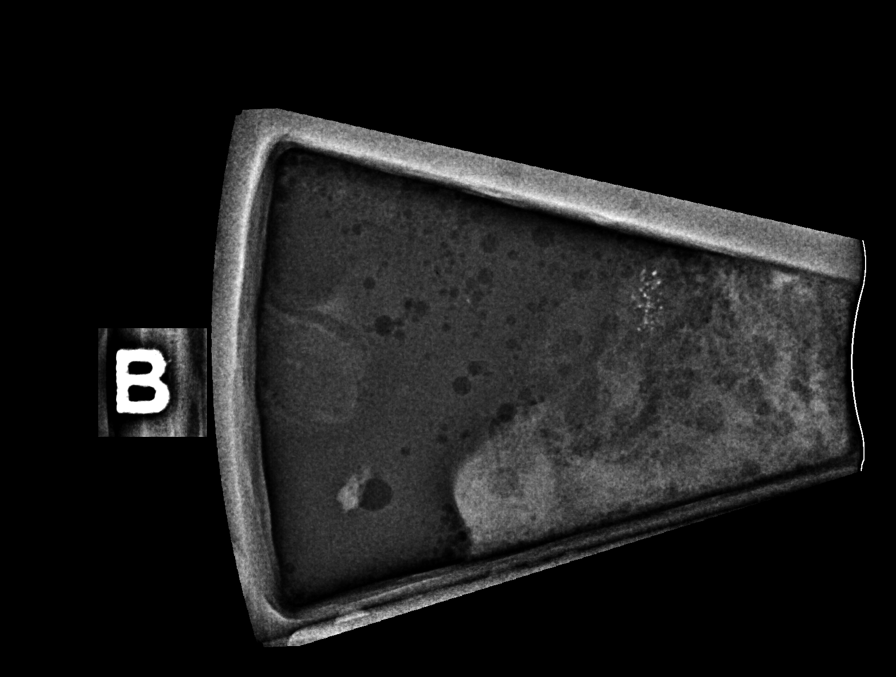

[R (4 of 6)]
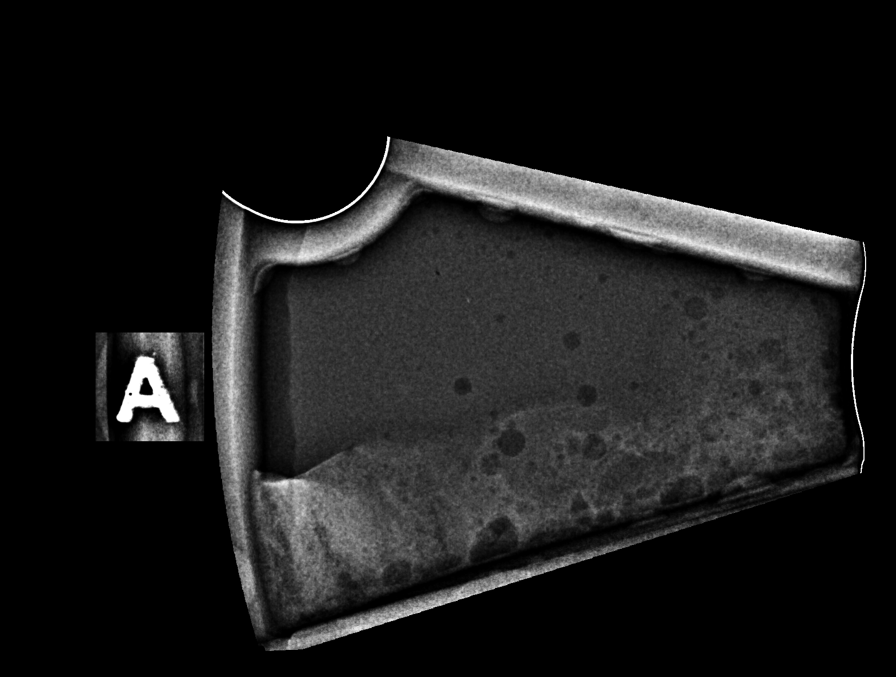

[R (5 of 6)]
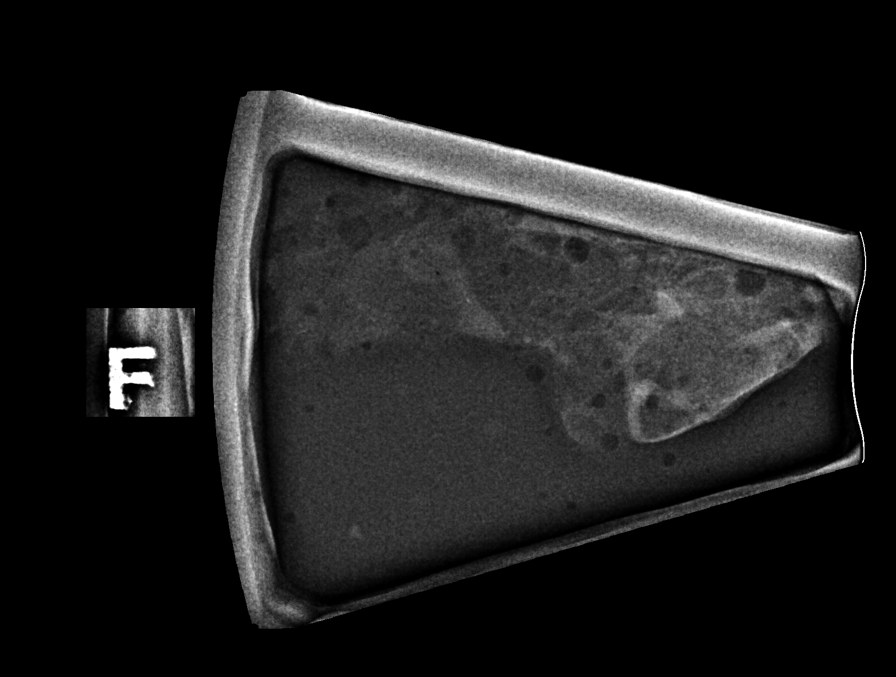

[R (6 of 6)]
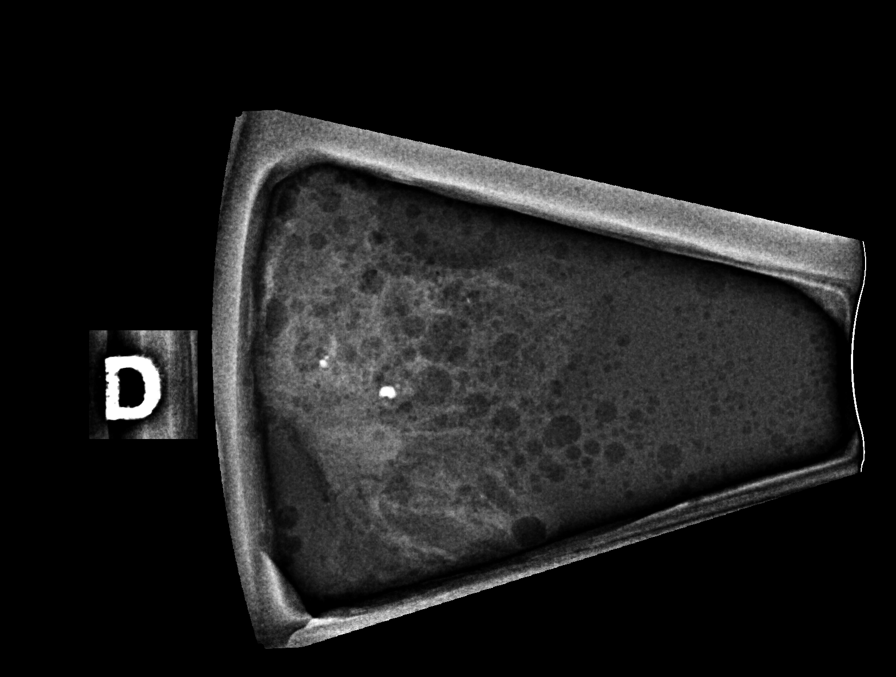

[6 of 6 positions shown; findings below may reference images not displayed]



Lesion quadrant: 12 o'clock

Using sterile technique and 1% Lidocaine as local anesthetic, under
direct ultrasound visualization, a 14 gauge Castillon device was
used to perform biopsy of a mass in the 12 o'clock region of the
left breast using a lateral to medial approach. At the conclusion of
the procedure ribbon shaped tissue marker clip was deployed into the
biopsy cavity. Follow up 2 view mammogram was performed and dictated
separately.

The patient and I discussed the procedure of stereotactic-guided
biopsy including benefits and alternatives. We discussed the high
likelihood of a successful procedure. We discussed the risks of the
procedure including infection, bleeding, tissue injury, clip
migration, and inadequate sampling. Informed written consent was
given. The usual time out protocol was performed immediately prior
to the procedure.

Using sterile technique and 1% lidocaine and 1% lidocaine with
epinephrine as local anesthetic, under stereotactic guidance, a 9
gauge vacuum assisted device was used to perform core needle biopsy
of calcifications in the lower outer quadrant of the right breast
(posterior) using a lateral to medial approach. Specimen radiograph
was performed showing calcifications are present in the tissue
samples. Specimens with calcifications are identified for pathology.

Lesion quadrant: LOWER OUTER QUADRANT (POSTERIOR EXTENT)

At the conclusion of the procedure, coil shaped tissue marker clip
was deployed into the biopsy cavity. Follow-up 2-view mammogram was
performed and dictated separately.

The patient and I discussed the procedure of stereotactic-guided
biopsy including benefits and alternatives. We discussed the high
likelihood of a successful procedure. We discussed the risks of the
procedure including infection, bleeding, tissue injury, clip
migration, and inadequate sampling. Informed written consent was
given. The usual time out protocol was performed immediately prior
to the procedure.

Using sterile technique and 1% lidocaine and 1% lidocaine with
epinephrine as local anesthetic, under stereotactic guidance, a 9
gauge vacuum assisted device was used to perform core needle biopsy
of calcifications in the lower outer quadrant of the right breast
(anterior) using a lateral to medial approach. Specimen radiograph
was performed showing calcifications are present in the tissue
samples. Specimens with calcifications are identified for pathology.

Lesion quadrant: LOWER OUTER QUADRANT (ANTERIOR EXTENT)

At the conclusion of the procedure, X shaped tissue marker clip was
deployed into the biopsy cavity. Follow-up 2-view mammogram was
performed and dictated separately.
IMPRESSION: Ultrasound guided biopsy of a mass in the 12 o'clock region of the
right breast. Stereotactic biopsy calcifications in the posterior
and anterior extent of the lower outer quadrant of the right breast.
No apparent complications.

ADDENDUM:
Pathology revealed FIBROADENOMA of the LEFT breast, 12:00 o'clock.
This was found to be concordant by Dr. Arooj Soares.

Pathology revealed INTERMEDIATE to HIGH GRADE DUCTAL CARCINOMA IN
SITU WITH NECROSIS AND CALCIFICATIONS of the RIGHT breast, lower
outer quadrant, (posterior). This was found to be concordant by Dr.
Arooj Soares.

Pathology revealed INTERMEDIATE to HIGH GRADE DUCTAL CARCINOMA IN
SITU WITH NECROSIS AND CALCIFICATIONS, COMPLEX SCLEROSING LESION
WITH CALCIFICATIONS of the RIGHT breast, lower outer quadrant,
(anterior). This was found to be concordant by Dr. Arooj Soares.

Pathology results were discussed with the patient by telephone. The
patient reported doing well after the biopsies with tenderness at
the sites. Post biopsy instructions and care were reviewed and
questions were answered. The patient was encouraged to call The
direct phone number was provided.

The patient was referred to [REDACTED]
[REDACTED] at [REDACTED] on
April 04, 2020.

Pathology results reported by Doro Mcgann, RN on 03/29/2020.



Lesion quadrant: 12 o'clock

Using sterile technique and 1% Lidocaine as local anesthetic, under
direct ultrasound visualization, a 14 gauge Castillon device was
used to perform biopsy of a mass in the 12 o'clock region of the
left breast using a lateral to medial approach. At the conclusion of
the procedure ribbon shaped tissue marker clip was deployed into the
biopsy cavity. Follow up 2 view mammogram was performed and dictated
separately.

The patient and I discussed the procedure of stereotactic-guided
biopsy including benefits and alternatives. We discussed the high
likelihood of a successful procedure. We discussed the risks of the
procedure including infection, bleeding, tissue injury, clip
migration, and inadequate sampling. Informed written consent was
given. The usual time out protocol was performed immediately prior
to the procedure.

Using sterile technique and 1% lidocaine and 1% lidocaine with
epinephrine as local anesthetic, under stereotactic guidance, a 9
gauge vacuum assisted device was used to perform core needle biopsy
of calcifications in the lower outer quadrant of the right breast
(posterior) using a lateral to medial approach. Specimen radiograph
was performed showing calcifications are present in the tissue
samples. Specimens with calcifications are identified for pathology.

Lesion quadrant: LOWER OUTER QUADRANT (POSTERIOR EXTENT)

At the conclusion of the procedure, coil shaped tissue marker clip
was deployed into the biopsy cavity. Follow-up 2-view mammogram was
performed and dictated separately.

The patient and I discussed the procedure of stereotactic-guided
biopsy including benefits and alternatives. We discussed the high
likelihood of a successful procedure. We discussed the risks of the
procedure including infection, bleeding, tissue injury, clip
migration, and inadequate sampling. Informed written consent was
given. The usual time out protocol was performed immediately prior
to the procedure.

Using sterile technique and 1% lidocaine and 1% lidocaine with
epinephrine as local anesthetic, under stereotactic guidance, a 9
gauge vacuum assisted device was used to perform core needle biopsy
of calcifications in the lower outer quadrant of the right breast
(anterior) using a lateral to medial approach. Specimen radiograph
was performed showing calcifications are present in the tissue
samples. Specimens with calcifications are identified for pathology.

Lesion quadrant: LOWER OUTER QUADRANT (ANTERIOR EXTENT)

At the conclusion of the procedure, X shaped tissue marker clip was
deployed into the biopsy cavity. Follow-up 2-view mammogram was
performed and dictated separately.
IMPRESSION: Ultrasound guided biopsy of a mass in the 12 o'clock region of the
right breast. Stereotactic biopsy calcifications in the posterior
and anterior extent of the lower outer quadrant of the right breast.
No apparent complications.

## 2022-04-22 MED FILL — Amlodipine Besylate Tab 5 MG (Base Equivalent): ORAL | 30 days supply | Qty: 30 | Fill #0 | Status: AC

## 2022-04-22 NOTE — Telephone Encounter (Signed)
Requested Prescriptions  Pending Prescriptions Disp Refills   amLODipine (NORVASC) 5 MG tablet 30 tablet 0    Sig: Take 1 tablet (5 mg total) by mouth daily. TAKE 1 TABLET(5 MG) BY MOUTH DAILY     Cardiovascular: Calcium Channel Blockers 2 Passed - 04/21/2022 12:00 AM      Passed - Last BP in normal range    BP Readings from Last 1 Encounters:  11/18/21 132/77         Passed - Last Heart Rate in normal range    Pulse Readings from Last 1 Encounters:  11/18/21 74         Passed - Valid encounter within last 6 months    Recent Outpatient Visits           9 months ago Prediabetes, with polyphagia   [facility] [place], Megan P, DO   1 year ago Routine general medical examination at a health care facility   [facility] [place], [place] P, DO   2 years ago Primary hypertension   [place] [facility] [place], [place], DO   2 years ago Essential hypertension   [place] [facility] [place], [place], DO   3 years ago Essential hypertension   [place] [facility] [employer], Oralia Rud, DO       Future Appointments             In 2 weeks Laural Benes, Oralia Rud, DO [facility]

## 2022-04-28 ENCOUNTER — Other Ambulatory Visit: Payer: Self-pay | Admitting: General Surgery

## 2022-04-28 ENCOUNTER — Inpatient Hospital Stay: Payer: PPO | Attending: Hematology and Oncology | Admitting: Hematology and Oncology

## 2022-04-28 VITALS — BP 134/73 | HR 92 | Temp 97.7°F | Resp 18 | Ht 62.0 in | Wt 175.2 lb

## 2022-04-28 DIAGNOSIS — D0511 Intraductal carcinoma in situ of right breast: Secondary | ICD-10-CM | POA: Insufficient documentation

## 2022-04-28 DIAGNOSIS — Z87891 Personal history of nicotine dependence: Secondary | ICD-10-CM | POA: Diagnosis not present

## 2022-04-28 DIAGNOSIS — Z8052 Family history of malignant neoplasm of bladder: Secondary | ICD-10-CM | POA: Diagnosis not present

## 2022-04-28 DIAGNOSIS — C50812 Malignant neoplasm of overlapping sites of left female breast: Secondary | ICD-10-CM

## 2022-04-28 DIAGNOSIS — D0512 Intraductal carcinoma in situ of left breast: Secondary | ICD-10-CM | POA: Diagnosis not present

## 2022-04-28 DIAGNOSIS — Z923 Personal history of irradiation: Secondary | ICD-10-CM | POA: Insufficient documentation

## 2022-04-28 DIAGNOSIS — Z807 Family history of other malignant neoplasms of lymphoid, hematopoietic and related tissues: Secondary | ICD-10-CM | POA: Insufficient documentation

## 2022-04-28 DIAGNOSIS — Z79811 Long term (current) use of aromatase inhibitors: Secondary | ICD-10-CM | POA: Insufficient documentation

## 2022-04-28 DIAGNOSIS — Z800 Family history of malignant neoplasm of digestive organs: Secondary | ICD-10-CM | POA: Insufficient documentation

## 2022-04-28 DIAGNOSIS — Z170 Estrogen receptor positive status [ER+]: Secondary | ICD-10-CM | POA: Diagnosis not present

## 2022-04-28 DIAGNOSIS — C50511 Malignant neoplasm of lower-outer quadrant of right female breast: Secondary | ICD-10-CM | POA: Diagnosis not present

## 2022-04-28 NOTE — Progress Notes (Signed)
SURVIVORSHIP VISIT:    BRIEF ONCOLOGIC HISTORY:  Oncology History  Ductal carcinoma in situ (DCIS) of right breast  03/28/2020 Initial Diagnosis   [place] woman status post right breast biopsy x2 on 03/28/2020 for ductal carcinoma in situ measuring approximately 5 cm, grade 2 or 3, estrogen receptor weakly positive at 20%, progesterone receptor negative   04/04/2020 Cancer Staging   Staging form: Breast, AJCC 8th Edition - Clinical stage from 04/04/2020: Stage 0 (cTis (DCIS), cN0, cM0, ER+, PR-) - Signed by Lowella Dell, MD on 04/04/2020 Stage prefix: Initial diagnosis Nuclear grade: GX Laterality: Right Staged by: Pathologist and managing physician Stage used in treatment planning: Yes National guidelines used in treatment planning: Yes Type of national guideline used in treatment planning: NCCN   05/02/2020 Surgery   status post right lumpectomy 05/02/2020 for a 6.7 cm high-grade ductal carcinoma in situ, with focally positive margins.             (a) additional surgery 07/11/2020 obtained at negative margins.   08/20/2020 - 10/04/2020 Radiation Therapy   08/20/2020 through 10/04/2020 Site Technique Total Dose (Gy) Dose per Fx (Gy) Completed Fx Beam Energies  Breast, Right: Breast_Rt 3D 50.4/50.4 1.8 28/28 6X, 10X  Breast, Right: Breast_Rt_Bst 3D 10/10 2 5/5 6X, 10X    11/29/2020 -  Anti-estrogen oral therapy   Anastrozole daily   01/18/2021 Cancer Staging   Staging form: Breast, AJCC 8th Edition - Pathologic: Stage 0 (pTis (DCIS), pN0, cM0, ER+, PR-) - Signed by Loa Socks, NP on 01/18/2021     INTERVAL HISTORY:   Kimberly Klein is here for follow-up.   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 She is scheduled to see Dr Donell Beers today afternoon. She was taking anastrozole as prescribed,  Rest of the pertinent 10 point ROS reviewed and negative  REVIEW OF SYSTEMS:  Review of Systems  Constitutional:  Negative for appetite change,  chills, fatigue, fever and unexpected weight change.  HENT:   Negative for hearing loss, lump/mass and trouble swallowing.   Eyes:  Negative for eye problems and icterus.  Respiratory:  Negative for chest tightness, cough and shortness of breath.   Cardiovascular:  Negative for chest pain, leg swelling and palpitations.  Gastrointestinal:  Negative for abdominal distention, abdominal pain, constipation, diarrhea, nausea and vomiting.  Endocrine: Negative for hot flashes.  Genitourinary:  Negative for difficulty urinating.   Musculoskeletal:  Negative for arthralgias.  Skin:  Negative for itching and rash.  Neurological:  Negative for dizziness, extremity weakness, headaches and numbness.  Hematological:  Negative for adenopathy. Does not bruise/bleed easily.  Psychiatric/Behavioral:  Negative for depression. The patient is not nervous/anxious.    Breast: Denies any new nodularity, masses, tenderness, nipple changes, or nipple discharge.      ONCOLOGY TREATMENT TEAM:  1. Surgeon:  Dr. Donell Beers at [facility] 2. Medical Oncologist: Dr. Darnelle Catalan  3. Radiation Oncologist: Dr. Roselind Messier    PAST MEDICAL/SURGICAL HISTORY:  Past Medical History:  Diagnosis Date   Back pain    Breast cancer 03/2020   Dysrhythmia    SVT at times   History of radiation therapy    Right breast -08/20/20-10/04/20- Dr. Antony Blackbird   Hx of cold sores    Hypertension 2019   Joint pain    Macular degeneration    Palpitations    Personal history of radiation therapy    Sciatica    SVT (supraventricular tachycardia) 07/2013   strees  test- normal   Past Surgical History:  Procedure Laterality Date   appendectomy     APPENDECTOMY  1921   AUGMENTATION MAMMAPLASTY Bilateral    implants removed 2022   BREAST BIOPSY Bilateral 03/28/2020   lt, rt x3   BREAST BIOPSY Left 04/14/2022   MM LT BREAST BX W LOC DEV 1ST LESION IMAGE BX SPEC STEREO GUIDE 04/14/2022 [facility]   BREAST BIOPSY Left  04/14/2022   MM LT BREAST BX W LOC DEV EA AD LESION IMG BX SPEC STEREO GUIDE 04/14/2022 [facility]   BREAST LUMPECTOMY Right 05/02/2020   BREAST LUMPECTOMY WITH RADIOACTIVE SEED LOCALIZATION Right 05/02/2020   Procedure: RIGHT BREAST LUMPECTOMY WITH RADIOACTIVE SEED LOCALIZATION X 2;  Surgeon: Almond Lint, MD;  Location: [facility];  Service: General;  Laterality: Right;   EYE SURGERY Bilateral 2022   cataract surgery   MASTOPEXY Bilateral 07/11/2020   Procedure: BILATERAL MASTOPEXY AND REMOVAL OF IMPLANTS WITH COMPLETE CAPSULECTOMY;  Surgeon: Peggye Form, DO;  Location: [facility] OR;  Service: Plastics;  Laterality: Bilateral;   RE-EXCISION OF BREAST LUMPECTOMY Right 07/11/2020   Procedure: RE-EXCISION OF RIGHT BREAST LUMPECTOMY;  Surgeon: Almond Lint, MD;  Location: [facility] OR;  Service: General;  Laterality: Right;     ALLERGIES:  No Known Allergies   CURRENT MEDICATIONS:  Outpatient Encounter Medications as of 04/28/2022  Medication Sig   amLODipine (NORVASC) 5 MG tablet Take 1 tablet (5 mg total) by mouth daily.   anastrozole (ARIMIDEX) 1 MG tablet Take 1 tablet (1 mg total) by mouth daily.   HYDROcodone-acetaminophen (NORCO/VICODIN) 5-325 MG tablet Take 2 tablets by mouth every 4 (four) hours as needed.   lisinopril (ZESTRIL) 20 MG tablet TAKE 1 TABLET(20 MG) BY MOUTH DAILY   meloxicam (MOBIC) 15 MG tablet Take 1 tablet (15 mg total) by mouth daily with meals.   meloxicam (MOBIC) 7.5 MG tablet Take 1 tablet (7.5 mg total) by mouth daily with meals.   Multiple Vitamins-Minerals (PRESERVISION AREDS 2+MULTI VIT PO) Take 1 capsule by mouth in the morning and at bedtime.   Semaglutide, 1 MG/DOSE, (OZEMPIC, 1 MG/DOSE,) 4 MG/3ML SOPN Inject 1 mg into the skin once a week.   Vitamin D, Ergocalciferol, (DRISDOL) 1.25 MG (50000 UNIT) CAPS capsule Take 1 capsule (50,000 Units total) by mouth every 7 (seven) days.   No facility-administered encounter medications on file as  of 04/28/2022.     ONCOLOGIC FAMILY HISTORY:  Family History  Problem Relation Age of Onset   Depression Mother    Obesity Mother    Hypertension Father    Hyperlipidemia Father    Heart attack Father    Stroke Father    Heart disease Father    Liver cancer Sister    Bladder Cancer Brother    Multiple myeloma Brother      GENETIC COUNSELING/TESTING: Not at this time   SOCIAL HISTORY:  Social History   Socioeconomic History   Marital status: Widowed    Spouse name: Not on file   Number of children: Not on file   Years of education: Not on file   Highest education level: Not on file  Occupational History   Occupation: PRN RN   Tobacco Use   Smoking status: Former    Packs/day: 0.00    Years: 20.00    Additional pack years: 0.00    Total pack years: 0.00    Types: Cigarettes    Quit date: 10/03/2007    Years since quitting:  14.5   Smokeless tobacco: Never  Vaping Use   Vaping Use: Never used  Substance and Sexual Activity   Alcohol use: Yes    Alcohol/week: 4.0 - 5.0 standard drinks of alcohol    Types: 4 - 5 Glasses of wine per week    Comment: occasional   Drug use: No   Sexual activity: Not Currently    Birth control/protection: Post-menopausal  Other Topics Concern   Not on file  Social History Narrative   Not on file   Social Determinants of Health   Financial Resource Strain: Low Risk  (10/31/2021)   Overall Financial Resource Strain (CARDIA)    Difficulty of Paying Living Expenses: Not hard at all  Food Insecurity: No Food Insecurity (08/13/2020)   Hunger Vital Sign    Worried About Running Out of Food in the Last Year: Never true    Ran Out of Food in the Last Year: Never true  Transportation Needs: No Transportation Needs (10/31/2021)   PRAPARE - Administrator, Civil Service (Medical): No    Lack of Transportation (Non-Medical): No  Physical Activity: Insufficiently Active (10/31/2021)   Exercise Vital Sign    Days of Exercise  per Week: 4 days    Minutes of Exercise per Session: 30 min  Stress: No Stress Concern Present (10/31/2021)   [employer] of Occupational Health - Occupational Stress Questionnaire    Feeling of Stress : Not at all  Social Connections: Socially Isolated (10/31/2021)   Social Connection and Isolation Panel [NHANES]    Frequency of Communication with Friends and Family: More than three times a week    Frequency of Social Gatherings with Friends and Family: More than three times a week    Attends Religious Services: Never    Database administrator or Organizations: No    Attends Banker Meetings: Never    Marital Status: Widowed  Intimate Partner Violence: Not At Risk (10/31/2021)   Humiliation, Afraid, Rape, and Kick questionnaire    Fear of Current or Ex-Partner: No    Emotionally Abused: No    Physically Abused: No    Sexually Abused: No     OBSERVATIONS/OBJECTIVE:  BP 134/73 (BP Location: Left Arm, Patient Position: Sitting)   Pulse 92   Temp 97.7 F (36.5 C) (Temporal)   Resp 18   Ht 5\' 2"  (1.575 m)   Wt 175 lb 3.2 oz (79.5 kg)   SpO2 99%   BMI 32.04 kg/m   Physical examination deferred in lieu of counseling   LABORATORY DATA:  None for this visit.  DIAGNOSTIC IMAGING:  None for this visit.     ASSESSMENT AND PLAN:   Kimberly Klein is a pleasant 72 y.o. 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  During her last visit in July have recommended that she try the anastrozole since her last visit here she had a mammogram which showed indeterminate calcifications in the left breast spanning almost 6.8 cm.  Biopsy from these calcifications showed DCIS, intermediate grade, necrosis and ER/PR 20% strong positive.  She has been taking anastrozole as prescribed.  She is  now scheduled to see Dr. Donell Beers again for consideration of left breast lumpectomy.  Given bilateral breast cancer I have also recommended referral to genetics, she requested this to happen at [facility] hence referral  placed to [place].  Now postlumpectomy she will consider radiation, message sent to Dr. Trenton Founds.  After radiation, we discussed about switching the antiestrogen therapy to tamoxifen.  I have reviewed the mechanism of action of tamoxifen, adverse effects including but not limited to postmenopausal symptoms such as hot flashes, vaginal discharge, arthralgias, small risk of DVT/PE, endometrial hyperplasia and endometrial carcinoma as well as benefit on bone density.  She is agreeable to proceed with tamoxifen after completing radiation.  She will return to clinic in about 6 to 8 weeks.  Thank you for consulting [place] in the care of this patient.  Please do not hesitate to contact us with any additional questions or concerns.  Rachel Moulds MD  Total time spent: 30 min.  *Total Encounter Time as defined by the Centers for Medicare and Medicaid Services includes, in addition to the face-to-face time of a patient visit (documented in the note above) non-face-to-face time: obtaining and reviewing outside history, ordering and reviewing medications, tests or procedures, care coordination (communications with other health care professionals or caregivers) and documentation in the medical record.

## 2022-04-29 ENCOUNTER — Encounter: Payer: Self-pay | Admitting: Licensed Clinical Social Worker

## 2022-04-29 ENCOUNTER — Other Ambulatory Visit: Payer: Self-pay | Admitting: General Surgery

## 2022-04-29 ENCOUNTER — Encounter: Payer: Self-pay | Admitting: *Deleted

## 2022-04-29 ENCOUNTER — Inpatient Hospital Stay: Payer: PPO | Admitting: Licensed Clinical Social Worker

## 2022-04-29 ENCOUNTER — Inpatient Hospital Stay: Payer: PPO

## 2022-04-29 DIAGNOSIS — Z853 Personal history of malignant neoplasm of breast: Secondary | ICD-10-CM

## 2022-04-29 DIAGNOSIS — D0512 Intraductal carcinoma in situ of left breast: Secondary | ICD-10-CM

## 2022-04-29 DIAGNOSIS — C50812 Malignant neoplasm of overlapping sites of left female breast: Secondary | ICD-10-CM

## 2022-04-29 NOTE — Progress Notes (Signed)
Radiation Oncology         [phone] ________________________________  Outpatient Re-Consultation  Name: Kimberly Klein MRN: [id number]  Date: 04/30/2022  DOB: February 23, 1950  WU:[id number], Oralia Rud, DO  Rachel Moulds, MD   REFERRING PHYSICIAN: Rachel Moulds, MD  DIAGNOSIS: There were no encounter diagnoses.  Stage 0 (cTis (DCIS), cN0, cM0) Left Breast UOQ, Intermediate grade DCIS, ER+ / PR+ / Her2 not assessed  History of Stage 0 Right Breast LOQ, DCIS, ER+ / PR- / Grade 2 or 3: diagnosed in 2022 s/p lumpectomy, XRT, and antiestrogen therapy with anastrozole   Interval Since Last Radiation: 1 year, 6 months, and 26 days    Intent: Curative  Radiation Treatment Dates: 08/20/2020 through 10/04/2020 Site Technique Total Dose (Gy) Dose per Fx (Gy) Completed Fx Beam Energies  Breast, Right: Breast_Rt 3D 50.4/50.4 1.8 28/28 6X, 10X  Breast, Right: Breast_Rt_Bst 3D 10/10 2 5/5 6X, 10X    HISTORY OF PRESENT ILLNESS::Kimberly Klein is a 72 y.o. female who is accompanied by ***. she is seen as a courtesy of Dr. Al Pimple for an opinion concerning radiation therapy as part of management for her recently diagnosed left breast DCIS. She was last seen here for follow-up on 11/05/20 for 1 month follow-up of radiation for her right breast DCIS. After our last visit, the patient continued with annual surveillance imaging.   The patient recently presented for a follow up bilateral diagnostic mammogram on 04/04/22 which revealed indeterminate calcifications in the outer and retroareolar left breast spanning 6.8 cm. No other abnormalities were appreciated in either breast, and benign post-surgical findings were seen in the right breast. (Follow up bilateral diagnostic mammogram performed last year on 03/21/21 showed no evidence of malignancy in either breast).   Biopsy of the upper outer posterior and anterior left breast on 04/14/22 showed intermediate grade DCIS, measuring 3 mm and 2 mm in the greatest  linear extent of the sample (respectively) with necrosis and calcifications. Prognostic indicators significant for: estrogen receptor 20% positive and progesterone receptor 20% positive, both with strong staining intensity; Her2 not assessed.   Accordingly, the patient followed up with Dr. Al Pimple on 04/28/22 to discuss treatment options. Given that this is her second breast cancer, Dr. Al Pimple has placed a referral to genetics. Following XRT, Dr. Al Pimple has also recommended switching her antiestrogen therapy to tamoxifen which the patient is agreeable to. The patient also met with Dr. Donell Beers that same day and she has opted to proceed with breast conserving surgery. Her procedure is currently scheduled for 05/21/22.   Other pertinent imaging performed includes a DXA for bone mineral density on 04/04/22 which showed a right femoral T-score of -2.7, classifying the patient as osteoporotic.    PAST MEDICAL HISTORY:  Past Medical History:  Diagnosis Date   Back pain    Breast cancer 03/2020   Dysrhythmia    SVT at times   History of radiation therapy    Right breast -08/20/20-10/04/20- Dr. Antony Blackbird   Hx of cold sores    Hypertension 2019   Joint pain    Macular degeneration    Palpitations    Personal history of radiation therapy    Sciatica    SVT (supraventricular tachycardia) 07/2013   strees test- normal    PAST SURGICAL HISTORY: Past Surgical History:  Procedure Laterality Date   appendectomy     APPENDECTOMY  1921   AUGMENTATION MAMMAPLASTY Bilateral    implants removed 2022   BREAST BIOPSY Bilateral 03/28/2020  lt, rt x3   BREAST BIOPSY Left 04/14/2022   MM LT BREAST BX W LOC DEV 1ST LESION IMAGE BX SPEC STEREO GUIDE 04/14/2022 [facility]   BREAST BIOPSY Left 04/14/2022   MM LT BREAST BX W LOC DEV EA AD LESION IMG BX SPEC STEREO GUIDE 04/14/2022 [facility]   BREAST LUMPECTOMY Right 05/02/2020   BREAST LUMPECTOMY WITH RADIOACTIVE SEED LOCALIZATION Right 05/02/2020    Procedure: RIGHT BREAST LUMPECTOMY WITH RADIOACTIVE SEED LOCALIZATION X 2;  Surgeon: Almond Lint, MD;  Location:  SURGERY CENTER;  Service: General;  Laterality: Right;   EYE SURGERY Bilateral 2022   cataract surgery   MASTOPEXY Bilateral 07/11/2020   Procedure: BILATERAL MASTOPEXY AND REMOVAL OF IMPLANTS WITH COMPLETE CAPSULECTOMY;  Surgeon: Peggye Form, DO;  Location: [facility];  Service: Plastics;  Laterality: Bilateral;   RE-EXCISION OF BREAST LUMPECTOMY Right 07/11/2020   Procedure: RE-EXCISION OF RIGHT BREAST LUMPECTOMY;  Surgeon: Almond Lint, MD;  Location: [facility] OR;  Service: General;  Laterality: Right;    FAMILY HISTORY:  Family History  Problem Relation Age of Onset   Depression Mother    Obesity Mother    Hypertension Father    Hyperlipidemia Father    Heart attack Father    Stroke Father    Heart disease Father    Liver cancer Sister        cholangiocarcinoma   Bladder Cancer Brother    Multiple myeloma Brother     SOCIAL HISTORY:  Social History   Tobacco Use   Smoking status: Former    Packs/day: 0.00    Years: 20.00    Additional pack years: 0.00    Total pack years: 0.00    Types: Cigarettes    Quit date: 10/03/2007    Years since quitting: 14.5   Smokeless tobacco: Never  Vaping Use   Vaping Use: Never used  Substance Use Topics   Alcohol use: Yes    Alcohol/week: 4.0 - 5.0 standard drinks of alcohol    Types: 4 - 5 Glasses of wine per week    Comment: occasional   Drug use: No    ALLERGIES: No Known Allergies  MEDICATIONS:  Current Outpatient Medications  Medication Sig Dispense Refill   amLODipine (NORVASC) 5 MG tablet Take 1 tablet (5 mg total) by mouth daily. 30 tablet 0   anastrozole (ARIMIDEX) 1 MG tablet Take 1 tablet (1 mg total) by mouth daily. 90 tablet 3   HYDROcodone-acetaminophen (NORCO/VICODIN) 5-325 MG tablet Take 2 tablets by mouth every 4 (four) hours as needed. 7 tablet 0   lisinopril (ZESTRIL) 20 MG tablet  TAKE 1 TABLET(20 MG) BY MOUTH DAILY 90 tablet 1   meloxicam (MOBIC) 15 MG tablet Take 1 tablet (15 mg total) by mouth daily with meals. 30 tablet 1   meloxicam (MOBIC) 7.5 MG tablet Take 1 tablet (7.5 mg total) by mouth daily with meals. 30 tablet 1   Multiple Vitamins-Minerals (PRESERVISION AREDS 2+MULTI VIT PO) Take 1 capsule by mouth in the morning and at bedtime.     Semaglutide, 1 MG/DOSE, (OZEMPIC, 1 MG/DOSE,) 4 MG/3ML SOPN Inject 1 mg into the skin once a week. 3 mL 0   Vitamin D, Ergocalciferol, (DRISDOL) 1.25 MG (50000 UNIT) CAPS capsule Take 1 capsule (50,000 Units total) by mouth every 7 (seven) days. 12 capsule 0   No current facility-administered medications for this encounter.    REVIEW OF SYSTEMS:  A 10+ POINT REVIEW OF SYSTEMS WAS OBTAINED including neurology,  dermatology, psychiatry, cardiac, respiratory, lymph, extremities, GI, GU, musculoskeletal, constitutional, reproductive, HEENT. ***   PHYSICAL EXAM:  vitals were not taken for this visit.   General: Alert and oriented, in no acute distress HEENT: Head is normocephalic. Extraocular movements are intact. Oropharynx is clear. Neck: Neck is supple, no palpable cervical or supraclavicular lymphadenopathy. Heart: Regular in rate and rhythm with no murmurs, rubs, or gallops. Chest: Clear to auscultation bilaterally, with no rhonchi, wheezes, or rales. Abdomen: Soft, nontender, nondistended, with no rigidity or guarding. Extremities: No cyanosis or edema. Lymphatics: see Neck Exam Skin: No concerning lesions. Musculoskeletal: symmetric strength and muscle tone throughout. Neurologic: Cranial nerves II through XII are grossly intact. No obvious focalities. Speech is fluent. Coordination is intact. Psychiatric: Judgment and insight are intact. Affect is appropriate.  Right Breast: *** no palpable mass, nipple discharge or bleeding. Left Breast: *** no palpable mass, nipple discharge or bleeding.   ECOG = ***  0 -  Asymptomatic (Fully active, able to carry on all predisease activities without restriction)  1 - Symptomatic but completely ambulatory (Restricted in physically strenuous activity but ambulatory and able to carry out work of a light or sedentary nature. For example, light housework, office work)  2 - Symptomatic, <50% in bed during the day (Ambulatory and capable of all self care but unable to carry out any work activities. Up and about more than 50% of waking hours)  3 - Symptomatic, >50% in bed, but not bedbound (Capable of only limited self-care, confined to bed or chair 50% or more of waking hours)  4 - Bedbound (Completely disabled. Cannot carry on any self-care. Totally confined to bed or chair)  5 - Death   Santiago Glad MM, Creech RH, Tormey DC, et al. [phone]). "Toxicity and response criteria of the [facility]". Am. Evlyn Clines. Oncol. 5 (6): 649-55  LABORATORY DATA:  Lab Results  Component Value Date   WBC 6.0 07/04/2021   HGB 13.6 07/04/2021   HCT 40.6 07/04/2021   MCV 93 07/04/2021   PLT 214 07/04/2021   NEUTROABS 3.9 07/04/2021   Lab Results  Component Value Date   NA 136 07/04/2021   K 4.1 07/04/2021   CL 98 07/04/2021   CO2 23 07/04/2021   GLUCOSE 98 07/04/2021   BUN 20 07/04/2021   CREATININE 0.99 07/04/2021   CALCIUM 9.8 07/04/2021      RADIOGRAPHY: MM LT BREAST BX W LOC DEV 1ST LESION IMAGE BX SPEC STEREO GUIDE  Addendum Date: 04/16/2022   ADDENDUM REPORT: 04/16/2022 07:53 ADDENDUM: Pathology revealed DUCTAL CARCINOMA IN SITU, 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 This was found to be concordant by Dr. Norva Pavlov. 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 This  was found to be concordant by Dr. Norva Pavlov. Pathology results were discussed with the patient by telephone. The patient reported doing well after the biopsies with tenderness at the sites. Post biopsy instructions and care were reviewed and questions were answered. The patient was encouraged to call [facility] for any additional concerns. My direct phone number was provided. Recommend MRI to evaluate extent of disease if breast conservation is being considered. Surgical consultation has been arranged with Dr. Almond Lint at [facility] on  April 28, 2022. Medical oncology consultation was previously arranged with Dr. Rachel Moulds at [facility] on April 28, 2022. Pathology results reported by Rene Kocher, RN on 04/15/2022. Electronically Signed   By: Norva Pavlov M.D.   On: 04/16/2022 07:53   Result Date: 04/16/2022 CLINICAL DATA:  Patient presents for stereotactic biopsy of LEFT breast calcifications. History of RIGHT lumpectomy and radiation therapy in 2022. EXAM: LEFT BREAST STEREOTACTIC CORE NEEDLE BIOPSY x2 COMPARISON:  Previous exam(s). FINDINGS: The patient and I discussed the procedure of stereotactic-guided biopsy including benefits and alternatives. We discussed the high likelihood of a successful procedure. We discussed the risks of the procedure including infection, bleeding, tissue injury, clip migration, and inadequate sampling. Informed written consent was given. The usual time out protocol was performed immediately prior to the procedure. Site 1: Lesion quadrant: Posterior UPPER OUTER QUADRANT LEFT breast, Venus clip Using sterile technique and lidocaine and lidocaine with epinephrine as local anesthetic, under stereotactic guidance, a 9 gauge vacuum assisted device was used to perform core needle biopsy of calcifications in the posterior UPPER OUTER QUADRANT the LEFT breast using a LATERAL approach. Specimen radiograph was  performed showing calcifications in numerous tissue samples. Specimens with calcifications are identified for pathology. At the conclusion of the procedure, Venus tissue marker clip was deployed into the biopsy cavity. Site 2: Lesion quadrant: Anterior UPPER OUTER QUADRANT LEFT, heart clip Using sterile technique and lidocaine and lidocaine with epinephrine as local anesthetic, under stereotactic guidance, a 9 gauge vacuum assisted device was used to perform core needle biopsy of calcifications in the anterior UPPER OUTER QUADRANT the LEFT breast using a LATERAL approach. Specimen radiograph was performed showing calcifications in numerous tissue samples. Specimens with calcifications are identified for pathology. At the conclusion of the procedure, heart tissue marker clip was deployed into the biopsy cavity. Follow-up 2-view mammogram was performed and dictated separately. IMPRESSION: Stereotactic-guided biopsy of anterior posterior extent of calcifications in the UPPER-OUTER QUADRANT of the LEFT. No apparent complications. Electronically Signed: By: Norva Pavlov M.D. 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 This was found to be concordant by Dr. Norva Pavlov. 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 This was found to be concordant by Dr. Norva Pavlov. Pathology results were discussed with the patient by telephone. The patient reported doing well after the biopsies with tenderness at the sites. Post biopsy  instructions and care were reviewed and questions were answered. The patient was encouraged to call [facility] for any additional concerns. My direct phone number was provided. Recommend MRI to evaluate extent of disease if breast conservation is being considered. Surgical consultation has been arranged with Dr. Almond Lint at [facility] on April 28, 2022. Medical oncology consultation was previously arranged with Dr. Rachel Moulds at [facility] on April 28, 2022. Pathology results reported by Rene Kocher, RN on 04/15/2022. Electronically Signed   By: Norva Pavlov M.D.   On: 04/16/2022 07:53   Result Date: 04/16/2022 CLINICAL DATA:  Patient presents for  stereotactic biopsy of LEFT breast calcifications. History of RIGHT lumpectomy and radiation therapy in 2022. EXAM: LEFT BREAST STEREOTACTIC CORE NEEDLE BIOPSY x2 COMPARISON:  Previous exam(s). FINDINGS: The patient and I discussed the procedure of stereotactic-guided biopsy including benefits and alternatives. We discussed the high likelihood of a successful procedure. We discussed the risks of the procedure including infection, bleeding, tissue injury, clip migration, and inadequate sampling. Informed written consent was given. The usual time out protocol was performed immediately prior to the procedure. Site 1: Lesion quadrant: Posterior UPPER OUTER QUADRANT LEFT breast, Venus clip Using sterile technique and lidocaine and lidocaine with epinephrine as local anesthetic, under stereotactic guidance, a 9 gauge vacuum assisted device was used to perform core needle biopsy of calcifications in the posterior UPPER OUTER QUADRANT the LEFT breast using a LATERAL approach. Specimen radiograph was performed showing calcifications in numerous tissue samples. Specimens with calcifications are identified for pathology. At the conclusion of the procedure, Venus tissue marker clip was deployed into the biopsy  cavity. Site 2: Lesion quadrant: Anterior UPPER OUTER QUADRANT LEFT, heart clip Using sterile technique and lidocaine and lidocaine with epinephrine as local anesthetic, under stereotactic guidance, a 9 gauge vacuum assisted device was used to perform core needle biopsy of calcifications in the anterior UPPER OUTER QUADRANT the LEFT breast using a LATERAL approach. Specimen radiograph was performed showing calcifications in numerous tissue samples. Specimens with calcifications are identified for pathology. At the conclusion of the procedure, heart tissue marker clip was deployed into the biopsy cavity. Follow-up 2-view mammogram was performed and dictated separately. IMPRESSION: Stereotactic-guided biopsy of anterior posterior extent of calcifications in the UPPER-OUTER QUADRANT of the LEFT. No apparent complications. Electronically Signed: By: Norva Pavlov M.D. On: 04/14/2022 08:30  MM CLIP PLACEMENT LEFT  Result Date: 04/14/2022 CLINICAL DATA:  Status post stereotactic biopsy of 2 sites in the LEFT breast. EXAM: 3D DIAGNOSTIC LEFT MAMMOGRAM POST STEREOTACTIC BIOPSY x2 COMPARISON:  Previous exam(s). FINDINGS: 3D Mammographic images were obtained following stereotactic guided biopsy of calcifications in the posterior UPPER OUTER QUADRANT the LEFT breast placement of a Venus shaped. The biopsy marking clip is in expected position at the site of biopsy. Following biopsy of more anterior group of calcifications, a heart shaped clip was placed and is identified in expected location. The clips are 4.2 centimeters apart on the craniocaudal projection. IMPRESSION: Tissue marker clips are in the expected locations after biopsy. Final Assessment: Post Procedure Mammograms for Marker Placement Electronically Signed   By: Norva Pavlov M.D.   On: 04/14/2022 08:42  MM DIAG BREAST TOMO BILATERAL  Result Date: 04/04/2022 CLINICAL DATA:  72 year old female presenting for annual exam. History of right breast cancer  status post lumpectomy in 2022. Patient a history of bilateral implants which have been removed. EXAM: DIGITAL DIAGNOSTIC BILATERAL MAMMOGRAM WITH TOMOSYNTHESIS TECHNIQUE: Bilateral digital diagnostic mammography and breast tomosynthesis was performed. COMPARISON:  Previous exam(s). ACR Breast Density Category b: There are scattered areas of fibroglandular density. FINDINGS: Right breast: A spot 2D magnification view of the lumpectomy site was performed in addition to standard views. There are stable postsurgical changes. No suspicious mass, distortion, or microcalcifications are identified to suggest presence of malignancy. Left breast: Spot 2D magnification views of the left breast were performed in addition to standard views. There are pleomorphic calcifications in the outer and retroareolar right breast spanning 6.8 cm. There are no additional new findings elsewhere in the left breast. IMPRESSION: 1. Indeterminate calcifications in the outer and retroareolar left breast spanning  6.8 cm. 2. Benign postsurgical changes in the right breast. No mammographic evidence of malignancy in the right breast. RECOMMENDATION: Stereotactic core needle biopsy x2 of the left breast targeting the anterior and posterior aspect of the calcifications. I have discussed the findings and recommendations with the patient. If applicable, a reminder letter will be sent to the patient regarding the next appointment. BI-RADS CATEGORY  4: Suspicious. Electronically Signed   By: Emmaline Kluver M.D.   On: 04/04/2022 10:46  DG Bone Density  Result Date: 04/04/2022 EXAM: DUAL X-RAY ABSORPTIOMETRY (DXA) FOR BONE MINERAL DENSITY IMPRESSION: Referring Physician:  Rachel Moulds Your patient completed a bone mineral density test using GE Lunar iDXA system (analysis version: 16). Technologist:   lmn PATIENT: Name: Marja, Adderley Patient ID: [id number] Birth Date: [birth year] Height: 61.5 in. Sex: Female Measured: 04/04/2022 Weight: 173.4 lbs.  Indications: Advanced Age, Arimidex, Breast Cancer History, History of Fracture (Adult) (V15.51), Postmenopausal Fractures: Finger Treatments: Hormone Therapy For Cancer, Vitamin D (E933.5) ASSESSMENT: The BMD measured at Femur Neck Right is 0.667 g/cm2 with a T-score of -2.7. This patient is considered osteoporotic according to World Health Organization [facility]) criteria. The quality of the exam is good. L3 was excluded due to degenerative changes. Site Region Measured Date Measured Age YA BMD Significant CHANGE T-score DualFemur Neck Right 04/04/2020    73.7         -2.7    0.667 g/cm2 AP Spine  L1-L4 (L3) 04/04/2022    73.7         -2.5    0.871 g/cm2 DualFemur Total Mean 04/04/2022    73.7         -2.0    0.756 g/cm2 World Health Organization [facility]) criteria for post-menopausal, Caucasian Women: Normal       T-score at or above -1 SD Osteopenia   T-score between -1 and -2.5 SD Osteoporosis T-score at or below -2.5 SD RECOMMENDATION: 1. All patients should optimize calcium and vitamin D intake. 2. Consider FDA-approved medical therapies in postmenopausal women and men aged 19 years and older, based on the following: a. A hip or vertebral (clinical or morphometric) fracture. b. T-score = -2.5 at the femoral neck or spine after appropriate evaluation to exclude secondary causes. c. Low bone mass (T-score between -1.0 and -2.5 at the femoral neck or spine) and a 10-year probability of a hip fracture = 3% or a 10-year probability of a major osteoporosis-related fracture = 20% based on the [place]-adapted WHO algorithm. d. Clinician judgment and/or patient preferences may indicate treatment for people with 10-year fracture probabilities above or below these levels. FOLLOW-UP: Patients with diagnosis of osteoporosis or at high risk for fracture should have regular bone mineral density tests.? Patients eligible for Medicare are allowed routine testing every 2 years.? The testing frequency can be increased to one year for  patients who have rapidly progressing disease, are receiving or discontinuing medical therapy to restore bone mass, or have additional risk factors. I have reviewed this study and agree with the findings. [facility] Electronically Signed   By: Romona Curls M.D.   On: 04/04/2022 10:26      IMPRESSION: Stage 0 (cTis (DCIS), cN0, cM0) Left Breast UOQ, Intermediate grade DCIS, ER+ / PR+ / Her2 not assessed  ***  Today, I talked to the patient and family about the findings and work-up thus far.  We discussed the natural history of *** and general treatment, highlighting the role of radiotherapy in the management.  We  discussed the available radiation techniques, and focused on the details of logistics and delivery.  We reviewed the anticipated acute and late sequelae associated with radiation in this setting.  The patient was encouraged to ask questions that I answered to the best of my ability. *** A patient consent form was discussed and signed.  We retained a copy for our records.  The patient would like to proceed with radiation and will be scheduled for CT simulation.  PLAN: ***    *** minutes of total time was spent for this patient encounter, including preparation, face-to-face counseling with the patient and coordination of care, physical exam, and documentation of the encounter.   ------------------------------------------------  Billie Lade, PhD, MD  This document serves as a record of services personally performed by Antony Blackbird, MD. It was created on his behalf by Neena Rhymes, a trained medical scribe. The creation of this record is based on the scribe's personal observations and the provider's statements to them. This document has been checked and approved by the attending provider.

## 2022-04-29 NOTE — Progress Notes (Signed)
REFERRING PROVIDER: Rachel Moulds, MD [address] [place],  [place] [address]  PRIMARY PROVIDER:  Olevia Perches P, DO  PRIMARY REASON FOR VISIT:  1. Ductal carcinoma in situ (DCIS) of left breast   2. History of breast cancer      HISTORY OF PRESENT ILLNESS:   Ms. Handley, a 72 y.o. female, was seen for a [place] cancer genetics consultation at the request of Dr. Al Pimple due to a personal history of breast cancer.  Ms. Krolikowski presents to clinic today to discuss the possibility of a hereditary predisposition to cancer, genetic testing, and to further clarify her future cancer risks, as well as potential cancer risks for family members.   CANCER HISTORY:  In 2022, at the age of 34, Ms. Strain was diagnosed with DCIS of the right breast. The treatment plan included lumpectomy, radiation, antiestrogen therapy. In 2024, at the age of 30, Ms. Wendt was diagnosed with DCIS of the left breast. Treatment plan currently includes lumpectomy,  adjuvant radiation, and antiestrogen therapy with tamoxifen.   Oncology History  Ductal carcinoma in situ (DCIS) of right breast  03/28/2020 Initial Diagnosis   [place] woman status post right breast biopsy x2 on 03/28/2020 for ductal carcinoma in situ measuring approximately 5 cm, grade 2 or 3, estrogen receptor weakly positive at 20%, progesterone receptor negative   04/04/2020 Cancer Staging   Staging form: Breast, AJCC 8th Edition - Clinical stage from 04/04/2020: Stage 0 (cTis (DCIS), cN0, cM0, ER+, PR-) - Signed by Lowella Dell, MD on 04/04/2020 Stage prefix: Initial diagnosis Nuclear grade: GX Laterality: Right Staged by: Pathologist and managing physician Stage used in treatment planning: Yes National guidelines used in treatment planning: Yes Type of national guideline used in treatment planning: NCCN   05/02/2020 Surgery   status post right lumpectomy 05/02/2020 for a 6.7 cm high-grade ductal carcinoma in situ, with focally positive  margins.             (a) additional surgery 07/11/2020 obtained at negative margins.   08/20/2020 - 10/04/2020 Radiation Therapy   08/20/2020 through 10/04/2020 Site Technique Total Dose (Gy) Dose per Fx (Gy) Completed Fx Beam Energies  Breast, Right: Breast_Rt 3D 50.4/50.4 1.8 28/28 6X, 10X  Breast, Right: Breast_Rt_Bst 3D 10/10 2 5/5 6X, 10X    11/29/2020 -  Anti-estrogen oral therapy   Anastrozole daily   01/18/2021 Cancer Staging   Staging form: Breast, AJCC 8th Edition - Pathologic: Stage 0 (pTis (DCIS), pN0, cM0, ER+, PR-) - Signed by Loa Socks, NP on 01/18/2021     RISK FACTORS:  Menarche was at age 11.  First live birth at age 17.  Ovaries intact: yes.  Hysterectomy: no.  Menopausal status: postmenopausal.  HRT use: 0 years. Mammogram within the last year: yes  Past Medical History:  Diagnosis Date   Back pain    Breast cancer 03/2020   Dysrhythmia    SVT at times   History of radiation therapy    Right breast -08/20/20-10/04/20- Dr. Antony Blackbird   Hx of cold sores    Hypertension 2019   Joint pain    Macular degeneration    Palpitations    Personal history of radiation therapy    Sciatica    SVT (supraventricular tachycardia) 07/2013   strees test- normal    Past Surgical History:  Procedure Laterality Date   appendectomy     APPENDECTOMY  1921   AUGMENTATION MAMMAPLASTY Bilateral    implants removed 2022  BREAST BIOPSY Bilateral 03/28/2020   lt, rt x3   BREAST BIOPSY Left 04/14/2022   MM LT BREAST BX W LOC DEV 1ST LESION IMAGE BX SPEC STEREO GUIDE 04/14/2022 [facility]   BREAST BIOPSY Left 04/14/2022   MM LT BREAST BX W LOC DEV EA AD LESION IMG BX SPEC STEREO GUIDE 04/14/2022 [facility]   BREAST LUMPECTOMY Right 05/02/2020   BREAST LUMPECTOMY WITH RADIOACTIVE SEED LOCALIZATION Right 05/02/2020   Procedure: RIGHT BREAST LUMPECTOMY WITH RADIOACTIVE SEED LOCALIZATION X 2;  Surgeon: Almond Lint, MD;  Location: [facility];  Service: General;  Laterality: Right;   EYE SURGERY Bilateral 2022   cataract surgery   MASTOPEXY Bilateral 07/11/2020   Procedure: BILATERAL MASTOPEXY AND REMOVAL OF IMPLANTS WITH COMPLETE CAPSULECTOMY;  Surgeon: Peggye Form, DO;  Location: [facility];  Service: Plastics;  Laterality: Bilateral;   RE-EXCISION OF BREAST LUMPECTOMY Right 07/11/2020   Procedure: RE-EXCISION OF RIGHT BREAST LUMPECTOMY;  Surgeon: Almond Lint, MD;  Location: [facility] OR;  Service: General;  Laterality: Right;    FAMILY HISTORY:  We obtained a detailed, 4-generation family history.  Significant diagnoses are listed below: Family History  Problem Relation Age of Onset   Depression Mother    Obesity Mother    Hypertension Father    Hyperlipidemia Father    Heart attack Father    Stroke Father    Heart disease Father    Liver cancer Sister        cholangiocarcinoma   Bladder Cancer Brother    Multiple myeloma Brother    Ms. Damien has 2 daughters and 1 adopted daughter. One of her daughters had melanoma. Ms. Gabrielson has 6 brothers and 4 sisters. One brother died of bladder cancer, another died of multiple myeloma. A sister has cholangiocarcinoma. No other known cancers in the family.  Ms. Burgener is unaware of previous family history of genetic testing for hereditary cancer risks. There is no reported Ashkenazi Jewish ancestry. There is no known consanguinity.    GENETIC COUNSELING ASSESSMENT: Ms. Beaber is a 72 y.o. female with a personal history of DCIS in her right breast and now DCIS in her left breast, which is somewhat suggestive of a hereditary cancer syndrome and predisposition to cancer. We, therefore, discussed and recommended the following at today's visit.   DISCUSSION: We discussed that approximately 10% of breast cancer is hereditary. Most cases of hereditary breast cancer are associated with BRCA1/BRCA2 genes, although there are other genes associated with hereditary cancer as well.  Cancers and risks are gene specific. We discussed that testing is beneficial for several reasons including knowing about cancer risks, identifying potential screening and risk-reduction options that may be appropriate, and to understand if other family members could be at risk for cancer and allow them to undergo genetic testing.   We reviewed the characteristics, features and inheritance patterns of hereditary cancer syndromes. We also discussed genetic testing, including the appropriate family members to test, the process of testing, insurance coverage and turn-around-time for results. We discussed the implications of a negative, positive and/or variant of uncertain significant result. Ms. Powis could pursue testing through the [facility]-Cancer+RNA panel.  Based on Ms. Stradley's personal history of cancer, she meets medical criteria for genetic testing. Despite that she meets criteria, she may still have an out of pocket cost. We discussed that if her out of pocket cost for testing is over $100, the laboratory will call and confirm whether she wants to proceed with testing.  If the out of pocket cost of testing is less than $100 she will be billed by the genetic testing laboratory.   PLAN: After considering the risks, benefits, and limitations, Ms. Broy did not wish to pursue genetic testing at today's visit. We understand this decision and remain available to coordinate genetic testing at any time in the future. We, therefore, recommend Ms. Pedley continue to follow the cancer screening guidelines given by her primary healthcare provider.  Ms. Weidemann questions were answered to her satisfaction today. Our contact information was provided should additional questions or concerns arise. Thank you for the referral and allowing [place] to share in the care of your patient.   Lacy Duverney, MS, [facility] Genetic Counselor [email or web address] Phone: [phone]  The patient was seen for a total  of 12 minutes in face-to-face genetic counseling.  Dr. Orlie Dakin was available for discussion regarding this case.   _______________________________________________________________________ For Office Staff:  Number of people involved in session: 1 Was an Intern/ student involved with case: no

## 2022-04-30 ENCOUNTER — Ambulatory Visit
Admission: RE | Admit: 2022-04-30 | Discharge: 2022-04-30 | Disposition: A | Discharge status: Home / Self Care | Payer: PPO | Source: Ambulatory Visit | Attending: Radiation Oncology | Admitting: Radiation Oncology

## 2022-04-30 ENCOUNTER — Telehealth: Payer: Self-pay | Admitting: Hematology and Oncology

## 2022-04-30 ENCOUNTER — Encounter: Payer: Self-pay | Admitting: Radiation Oncology

## 2022-04-30 VITALS — BP 141/98 | HR 81 | Temp 97.5°F | Resp 20 | Wt 178.0 lb

## 2022-04-30 DIAGNOSIS — D0511 Intraductal carcinoma in situ of right breast: Secondary | ICD-10-CM | POA: Diagnosis not present

## 2022-04-30 DIAGNOSIS — D0512 Intraductal carcinoma in situ of left breast: Secondary | ICD-10-CM

## 2022-04-30 DIAGNOSIS — Z170 Estrogen receptor positive status [ER+]: Secondary | ICD-10-CM | POA: Diagnosis not present

## 2022-04-30 NOTE — Telephone Encounter (Signed)
Left patient a vm regarding upcoming appointment  

## 2022-04-30 NOTE — Progress Notes (Signed)
Location of Breast Cancer: Ductal carcinoma in situ (DCIS) of left breast   Histology per Pathology Report:  04-15-22   Receptor Status: ER(20%), PR (20%), Her2-neu (), Ki-67()  Did patient present with symptoms (if so, please note symptoms) or was this found on screening mammography?: screening mammogram  Past/Anticipated interventions by surgeon, if any: Left breast bracked lumpectomey on 05/21/22.   Surgeon: Almond Lint  Dr.Byerly on 04-28-22 Initial history:  Pt had dx of right breast cancer 03/2020. She had presented wtih screening detected right breast calcifications. She also had a left breast mass on screening. Dx imaging was performed showing 5 cm of calcs in the LOQ on the right. The left breast mass was around 1 cm. She had biopsies of the right breast at the anterior and posterior aspect of the calcifications. She also had the left breast mass sampled. The left breast mass was benign. 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  She has no prior cancer history before this. Her brother had bladder cancer and sister with cholangiocarcinoma, brother with multiple myeloma. She had menarche at age 14. she is post menopausal. She is a G3P3 wtih first child at age 58. 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  She underwent bracketed lumpectomy 05/02/2020. She had no invasive cancer seen. Unfortunately the posterior margin was positive (implant capsule) and several other margins were <1 mm for DCIS. After discussing multiple options, patient wanted to have reexcision with removal of implants. I did this in conjunction with Dr. Ulice Bold 07/10/20. After this, all the final margins were negative.  Interval history:  Pt received adjuvant radiation and initially opted out of anastrozole. She developed a breast deformity on the right. She started anastrozole around 6 months ago after discussing it again with  oncology. Unfortunately, her annual mammogram showed 6.8 cm of indeterminate calcifications in the left central and outer breast. She underwent core needle biopsy of the anterior and posterior aspects of this and both showed strongly ER/PR positive intermediate grade DCIS. So a new diagnosis of LEFT breast cancer 04/2022.   Past/Anticipated interventions by medical oncology, if any:  Dr. Al Pimple on 04-28-22 Oncology History  Ductal carcinoma in situ (DCIS) of right breast  03/28/2020 Initial Diagnosis    [place] woman status post right breast biopsy x2 on 03/28/2020 for ductal carcinoma in situ measuring approximately 5 cm, grade 2 or 3, estrogen receptor weakly positive at 20%, progesterone receptor negative    04/04/2020 Cancer Staging    Staging form: Breast, AJCC 8th Edition - Clinical stage from 04/04/2020: Stage 0 (cTis (DCIS), cN0, cM0, ER+, PR-) - Signed by Lowella Dell, MD on 04/04/2020 Stage prefix: Initial diagnosis Nuclear grade: GX Laterality: Right Staged by: Pathologist and managing physician Stage used in treatment planning: Yes National guidelines used in treatment planning: Yes Type of national guideline used in treatment planning: NCCN    05/02/2020 Surgery    status post right lumpectomy 05/02/2020 for a 6.7 cm high-grade ductal carcinoma in situ, with focally positive margins.             (a) additional surgery 07/11/2020 obtained at negative margins.    08/20/2020 - 10/04/2020 Radiation Therapy    08/20/2020 through 10/04/2020 Site Technique Total Dose (Gy) Dose per Fx (Gy) Completed Fx Beam Energies  Breast, Right: Breast_Rt 3D 50.4/50.4 1.8 28/28 6X, 10X  Breast, Right: Breast_Rt_Bst 3D 10/10 2 5/5 6X, 10X  11/29/2020 -  Anti-estrogen oral therapy    Anastrozole daily    01/18/2021 Cancer Staging    Staging form: Breast, AJCC 8th Edition - Pathologic: Stage 0 (pTis (DCIS), pN0, cM0, ER+, PR-) - Signed by Loa Socks, NP on 01/18/2021     ASSESSMENT  AND PLAN:    Ms.. Junk is a pleasant 72 y.o. 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  During her last visit in July have recommended that she try the anastrozole since her last visit here she had a mammogram which showed indeterminate calcifications in the left breast spanning almost 6.8 cm.  Biopsy from these calcifications showed DCIS, intermediate grade, necrosis and ER/PR 20% strong positive.  She has been taking anastrozole as prescribed.  She is now scheduled to see Dr. Donell Beers again for consideration of left breast lumpectomy.  Given bilateral breast cancer I have also recommended referral to genetics, she requested this to happen at [facility] hence referral placed to [place].  Now postlumpectomy she will consider radiation, message sent to Dr. Trenton Founds.  After radiation, we discussed about switching the antiestrogen therapy to tamoxifen.  I have reviewed the mechanism of action of tamoxifen, adverse effects including but not limited to postmenopausal symptoms such as hot flashes, vaginal discharge, arthralgias, small risk of DVT/PE, endometrial hyperplasia and endometrial carcinoma as well as benefit on bone density.  She is agreeable to proceed with tamoxifen after completing radiation.  She will return to clinic in about 6 to 8 weeks.  Thank you for consulting [place] in the care of this patient.  Please do not hesitate to contact us with any additional questions or concerns.  Lymphedema issues, if any:  No  Pain issues, if any:  no   SAFETY ISSUES: Prior radiation? Yes, 2022 right breast Pacemaker/ICD? no Possible current pregnancy?no Is the patient on methotrexate? no  Current Complaints / other details:  Pt currently on Arimidex.    BP (!) 141/98   Pulse 81   Temp (!) 97.5 F (36.4 C)   Resp 20    Wt 178 lb (80.7 kg)   SpO2 98%   BMI 32.56 kg/m

## 2022-05-02 ENCOUNTER — Ambulatory Visit (INDEPENDENT_AMBULATORY_CARE_PROVIDER_SITE_OTHER): Payer: PPO | Admitting: Family Medicine

## 2022-05-02 ENCOUNTER — Encounter: Payer: Self-pay | Admitting: Family Medicine

## 2022-05-02 VITALS — BP 127/78 | HR 81 | Temp 97.6°F | Wt 175.3 lb

## 2022-05-02 DIAGNOSIS — D0512 Intraductal carcinoma in situ of left breast: Secondary | ICD-10-CM

## 2022-05-02 DIAGNOSIS — R7301 Impaired fasting glucose: Secondary | ICD-10-CM

## 2022-05-02 DIAGNOSIS — K219 Gastro-esophageal reflux disease without esophagitis: Secondary | ICD-10-CM | POA: Diagnosis not present

## 2022-05-02 DIAGNOSIS — E538 Deficiency of other specified B group vitamins: Secondary | ICD-10-CM | POA: Diagnosis not present

## 2022-05-02 DIAGNOSIS — D0511 Intraductal carcinoma in situ of right breast: Secondary | ICD-10-CM | POA: Diagnosis not present

## 2022-05-02 DIAGNOSIS — I10 Essential (primary) hypertension: Secondary | ICD-10-CM

## 2022-05-02 DIAGNOSIS — E7800 Pure hypercholesterolemia, unspecified: Secondary | ICD-10-CM | POA: Diagnosis not present

## 2022-05-02 DIAGNOSIS — E559 Vitamin D deficiency, unspecified: Secondary | ICD-10-CM

## 2022-05-02 DIAGNOSIS — Z0000 Encounter for general adult medical examination without abnormal findings: Secondary | ICD-10-CM

## 2022-05-02 LAB — BAYER DCA HB A1C WAIVED: HB A1C (BAYER DCA - WAIVED): 5.7 % — ABNORMAL HIGH (ref 4.8–5.6)

## 2022-05-02 NOTE — Assessment & Plan Note (Signed)
Continue to follow with oncology. Call with any concerns.  

## 2022-05-02 NOTE — Assessment & Plan Note (Addendum)
Doing great with A1c of 5.7. Continue to monitor. Call with any concerns.

## 2022-05-02 NOTE — Assessment & Plan Note (Signed)
Following with oncology. To have surgery shortly. Continue to monitor.

## 2022-05-02 NOTE — Assessment & Plan Note (Signed)
Under good control on current regimen. Continue current regimen. Continue to monitor. Call with any concerns. Refills given. Labs drawn today.   

## 2022-05-02 NOTE — Progress Notes (Signed)
BP 127/78   Pulse 81   Temp 97.6 F (36.4 C) (Oral)   Wt 175 lb 4.8 oz (79.5 kg)   SpO2 98%   BMI 32.06 kg/m    Subjective:    Patient ID: Kimberly Klein, female    DOB: 06/21/1950, 72 y.o.   MRN: [id number]  HPI: Kimberly Klein is a 72 y.o. female presenting on 05/02/2022 for comprehensive medical examination. Current medical complaints include:  HYPERTENSION / HYPERLIPIDEMIA Satisfied with current treatment? yes Duration of hypertension: chronic BP monitoring frequency: not checking BP medication side effects: no Past BP meds: lisinopril, amlodipine Duration of hyperlipidemia: chronic Cholesterol medication side effects: no Cholesterol supplements: none Past cholesterol medications: none Medication compliance: excellent compliance Aspirin: no Recent stressors: no Recurrent headaches: no Visual changes: no Palpitations: no Dyspnea: no Chest pain: no Lower extremity edema: no Dizzy/lightheaded: no  Impaired Fasting Glucose HbA1C:  Lab Results  Component Value Date   HGBA1C 5.7 (H) 05/02/2022   Duration of elevated blood sugar: chronic Polydipsia: no Polyuria: no Weight change: no Visual disturbance: no Glucose Monitoring: no Diabetic Education: Completed  Menopausal Symptoms: no  Depression Screen done today and results listed below:     05/02/2022    8:17 AM 04/30/2022    3:38 PM 10/31/2021   12:10 PM 07/04/2021    4:22 PM 12/31/2020   10:03 AM  Depression screen PHQ 2/9  Decreased Interest 0 0 0 0 0  Down, Depressed, Hopeless 0 0 0 0 0  PHQ - 2 Score 0 0 0 0 0  Altered sleeping 1  3 0 1  Tired, decreased energy 1  0 1 0  Change in appetite 1  0 0 0  Feeling bad or failure about yourself  0  0 0 0  Trouble concentrating 0  0 0 0  Moving slowly or fidgety/restless 0  0 0 0  Suicidal thoughts 0  0 0 0  PHQ-9 Score Difficult doing work/chores Somewhat difficult  Not difficult at all Not difficult at all     Past Medical History:   Past Medical History:  Diagnosis Date   Back pain    Breast cancer 03/2020   Dysrhythmia    SVT at times   History of radiation therapy    Right breast -08/20/20-10/04/20- Dr. Antony Blackbird   Hx of cold sores    Hypertension 2019   Joint pain    Macular degeneration    Palpitations    Personal history of radiation therapy    Sciatica    SVT (supraventricular tachycardia) 07/2013   strees test- normal    Surgical History:  Past Surgical History:  Procedure Laterality Date   appendectomy     APPENDECTOMY  1921   AUGMENTATION MAMMAPLASTY Bilateral    implants removed 2022   BREAST BIOPSY Bilateral 03/28/2020   lt, rt x3   BREAST BIOPSY Left 04/14/2022   MM LT BREAST BX W LOC DEV 1ST LESION IMAGE BX SPEC STEREO GUIDE 04/14/2022 [facility]   BREAST BIOPSY Left 04/14/2022   MM LT BREAST BX W LOC DEV EA AD LESION IMG BX SPEC STEREO GUIDE 04/14/2022 [facility]   BREAST LUMPECTOMY Right 05/02/2020   BREAST LUMPECTOMY WITH RADIOACTIVE SEED LOCALIZATION Right 05/02/2020   Procedure: RIGHT BREAST LUMPECTOMY WITH RADIOACTIVE SEED LOCALIZATION X 2;  Surgeon: Almond Lint, MD;  Location: [facility];  Service: General;  Laterality: Right;   EYE SURGERY Bilateral  2022   cataract surgery   MASTOPEXY Bilateral 07/11/2020   Procedure: BILATERAL MASTOPEXY AND REMOVAL OF IMPLANTS WITH COMPLETE CAPSULECTOMY;  Surgeon: Peggye Form, DO;  Location: [facility];  Service: Plastics;  Laterality: Bilateral;   RE-EXCISION OF BREAST LUMPECTOMY Right 07/11/2020   Procedure: RE-EXCISION OF RIGHT BREAST LUMPECTOMY;  Surgeon: Almond Lint, MD;  Location: [facility];  Service: General;  Laterality: Right;    Medications:  Current Outpatient Medications on File Prior to Visit  Medication Sig   amLODipine (NORVASC) 5 MG tablet Take 1 tablet (5 mg total) by mouth daily.   lisinopril (ZESTRIL) 20 MG tablet TAKE 1 TABLET(20 MG) BY MOUTH DAILY   meloxicam (MOBIC) 15 MG tablet Take 1  tablet (15 mg total) by mouth daily with meals.   Multiple Vitamins-Minerals (PRESERVISION AREDS 2+MULTI VIT PO) Take 1 capsule by mouth in the morning and at bedtime.   Vitamin D, Ergocalciferol, (DRISDOL) 1.25 MG (50000 UNIT) CAPS capsule Take 1 capsule (50,000 Units total) by mouth every 7 (seven) days.   No current facility-administered medications on file prior to visit.    Allergies:  No Known Allergies  Social History:  Social History   Socioeconomic History   Marital status: Widowed    Spouse name: Not on file   Number of children: Not on file   Years of education: Not on file   Highest education level: Not on file  Occupational History   Occupation: PRN RN   Tobacco Use   Smoking status: Former    Packs/day: 0.00    Years: 20.00    Additional pack years: 0.00    Total pack years: 0.00    Types: Cigarettes    Quit date: 10/03/2007    Years since quitting: 14.5   Smokeless tobacco: Never  Vaping Use   Vaping Use: Never used  Substance and Sexual Activity   Alcohol use: Yes    Alcohol/week: 4.0 - 5.0 standard drinks of alcohol    Types: 4 - 5 Glasses of wine per week    Comment: occasional   Drug use: No   Sexual activity: Not Currently    Birth control/protection: Post-menopausal  Other Topics Concern   Not on file  Social History Narrative   Not on file   Social Determinants of Health   Financial Resource Strain: Low Risk  (10/31/2021)   Overall Financial Resource Strain (CARDIA)    Difficulty of Paying Living Expenses: Not hard at all  Food Insecurity: No Food Insecurity (04/30/2022)   Hunger Vital Sign    Worried About Running Out of Food in the Last Year: Never true    Ran Out of Food in the Last Year: Never true  Transportation Needs: No Transportation Needs (04/30/2022)   PRAPARE - Administrator, Civil Service (Medical): No    Lack of Transportation (Non-Medical): No  Physical Activity: Insufficiently Active (10/31/2021)   Exercise  Vital Sign    Days of Exercise per Week: 4 days    Minutes of Exercise per Session: 30 min  Stress: No Stress Concern Present (10/31/2021)   [employer] of Occupational Health - Occupational Stress Questionnaire    Feeling of Stress : Not at all  Social Connections: Socially Isolated (10/31/2021)   Social Connection and Isolation Panel [NHANES]    Frequency of Communication with Friends and Family: More than three times a week    Frequency of Social Gatherings with Friends and Family: More than three times a week  Attends Religious Services: Never    Active Member of Clubs or Organizations: No    Attends Banker Meetings: Never    Marital Status: Widowed  Intimate Partner Violence: Not At Risk (04/30/2022)   Humiliation, Afraid, Rape, and Kick questionnaire    Fear of Current or Ex-Partner: No    Emotionally Abused: No    Physically Abused: No    Sexually Abused: No   Social History   Tobacco Use  Smoking Status Former   Packs/day: 0.00   Years: 20.00   Additional pack years: 0.00   Total pack years: 0.00   Types: Cigarettes   Quit date: 10/03/2007   Years since quitting: 14.5  Smokeless Tobacco Never   Social History   Substance and Sexual Activity  Alcohol Use Yes   Alcohol/week: 4.0 - 5.0 standard drinks of alcohol   Types: 4 - 5 Glasses of wine per week   Comment: occasional    Family History:  Family History  Problem Relation Age of Onset   Depression Mother    Obesity Mother    Hypertension Father    Hyperlipidemia Father    Heart attack Father    Stroke Father    Heart disease Father    Liver cancer Sister        cholangiocarcinoma   Bladder Cancer Brother    Multiple myeloma Brother     Past medical history, surgical history, medications, allergies, family history and social history reviewed with patient today and changes made to appropriate areas of the chart.   Review of Systems  Constitutional: Negative.   HENT: Negative.     Eyes:  Positive for blurred vision. Negative for double vision, photophobia, pain, discharge and redness.  Respiratory: Negative.    Cardiovascular: Negative.   Gastrointestinal: Negative.   Genitourinary: Negative.   Musculoskeletal: Negative.   Skin: Negative.   Neurological: Negative.   Endo/Heme/Allergies: Negative.   Psychiatric/Behavioral: Negative.     All other ROS negative except what is listed above and in the HPI.      Objective:    BP 127/78   Pulse 81   Temp 97.6 F (36.4 C) (Oral)   Wt 175 lb 4.8 oz (79.5 kg)   SpO2 98%   BMI 32.06 kg/m   Wt Readings from Last 3 Encounters:  05/02/22 175 lb 4.8 oz (79.5 kg)  04/30/22 178 lb (80.7 kg)  04/28/22 175 lb 3.2 oz (79.5 kg)    Physical Exam Vitals and nursing note reviewed.  Constitutional:      General: She is not in acute distress.    Appearance: Normal appearance. She is not ill-appearing, toxic-appearing or diaphoretic.  HENT:     Head: Normocephalic and atraumatic.     Right Ear: Tympanic membrane, ear canal and external ear normal. There is no impacted cerumen.     Left Ear: Tympanic membrane, ear canal and external ear normal. There is no impacted cerumen.     Nose: Nose normal. No congestion or rhinorrhea.     Mouth/Throat:     Mouth: Mucous membranes are moist.     Pharynx: Oropharynx is clear. No oropharyngeal exudate or posterior oropharyngeal erythema.  Eyes:     General: No scleral icterus.       Right eye: No discharge.        Left eye: No discharge.     Extraocular Movements: Extraocular movements intact.     Conjunctiva/sclera: Conjunctivae normal.     Pupils: Pupils are  equal, round, and reactive to light.  Neck:     Vascular: No carotid bruit.  Cardiovascular:     Rate and Rhythm: Normal rate and regular rhythm.     Pulses: Normal pulses.     Heart sounds: No murmur heard.    No friction rub. No gallop.  Pulmonary:     Effort: Pulmonary effort is normal. No respiratory distress.      Breath sounds: Normal breath sounds. No stridor. No wheezing, rhonchi or rales.  Chest:     Chest wall: No tenderness.  Abdominal:     General: Abdomen is flat. Bowel sounds are normal. There is no distension.     Palpations: Abdomen is soft. There is no mass.     Tenderness: There is no abdominal tenderness. There is no right CVA tenderness, left CVA tenderness, guarding or rebound.     Hernia: No hernia is present.  Genitourinary:    Comments: Breast and pelvic exams deferred with shared decision making Musculoskeletal:        General: No swelling, tenderness, deformity or signs of injury.     Cervical back: Normal range of motion and neck supple. No rigidity. No muscular tenderness.     Right lower leg: No edema.     Left lower leg: No edema.  Lymphadenopathy:     Cervical: No cervical adenopathy.  Skin:    General: Skin is warm and dry.     Capillary Refill: Capillary refill takes less than 2 seconds.     Coloration: Skin is not jaundiced or pale.     Findings: No bruising, erythema, lesion or rash.  Neurological:     General: No focal deficit present.     Mental Status: She is alert and oriented to person, place, and time. Mental status is at baseline.     Cranial Nerves: No cranial nerve deficit.     Sensory: No sensory deficit.     Motor: No weakness.     Coordination: Coordination normal.     Gait: Gait normal.     Deep Tendon Reflexes: Reflexes normal.  Psychiatric:        Mood and Affect: Mood normal.        Behavior: Behavior normal.        Thought Content: Thought content normal.        Judgment: Judgment normal.     Results for orders placed or performed in visit on 05/02/22  Bayer DCA Hb A1c Waived  Result Value Ref Range   HB A1C (BAYER DCA - WAIVED) 5.7 (H) 4.8 - 5.6 %      Assessment & Plan:   Problem List Items Addressed This Visit       Cardiovascular and Mediastinum   Essential hypertension    Under good control on current regimen. Continue  current regimen. Continue to monitor. Call with any concerns. Refills given. Labs drawn today.        Relevant Orders   CBC with Differential/Platelet   Comprehensive metabolic panel   TSH   Microalbumin, Urine Waived   Urinalysis, Routine w reflex microscopic     Digestive   GERD (gastroesophageal reflux disease)    Under good control on current regimen. Continue current regimen. Continue to monitor. Call with any concerns. Refills given. Labs drawn today.       Relevant Orders   CBC with Differential/Platelet   Comprehensive metabolic panel     Endocrine   Impaired fasting glucose/Prediabetes    Doing  great with A1c of 5.7. Continue to monitor. Call with any concerns.       Relevant Orders   CBC with Differential/Platelet   Comprehensive metabolic panel   Bayer DCA Hb Z6X Waived (Completed)     Other   Ductal carcinoma in situ (DCIS) of right breast    Continue to follow with oncology. Call with any concerns.       Vitamin D deficiency    Rechecking labs today. Await results. Treat as needed.       Relevant Orders   CBC with Differential/Platelet   Comprehensive metabolic panel   VITAMIN D 25 Hydroxy (Vit-D Deficiency, Fractures)   B12 deficiency    Rechecking labs today. Await results. Treat as needed.       Relevant Orders   CBC with Differential/Platelet   Comprehensive metabolic panel   W96   Pure hypercholesterolemia    Rechecking labs today. Await results. Treat as needed.       Relevant Orders   CBC with Differential/Platelet   Comprehensive metabolic panel   Lipid Panel w/o Chol/HDL Ratio   Ductal carcinoma in situ (DCIS) of left breast    Following with oncology. To have surgery shortly. Continue to monitor.       Other Visit Diagnoses     Routine general medical examination at a health care facility    -  Primary        Follow up plan: Return in about 6 months (around 11/01/2022).   LABORATORY TESTING:  - Pap smear: not  applicable  IMMUNIZATIONS:   - Tdap: Tetanus vaccination status reviewed: last tetanus booster within 10 years. - Influenza: Up to date - Pneumovax: Up to date - Prevnar: Up to date - COVID: Up to date - HPV: Not applicable - Shingrix vaccine: Refused  SCREENING: -Mammogram: Up to date  - Colonoscopy: Up to date  - Bone Density: Up to date   PATIENT COUNSELING:   Advised to take 1 mg of folate supplement per day if capable of pregnancy.   Sexuality: Discussed sexually transmitted diseases, partner selection, use of condoms, avoidance of unintended pregnancy  and contraceptive alternatives.   Advised to avoid cigarette smoking.  I discussed with the patient that most people either abstain from alcohol or drink within safe limits (<=14/week and <=4 drinks/occasion for males, <=7/weeks and <= 3 drinks/occasion for females) and that the risk for alcohol disorders and other health effects rises proportionally with the number of drinks per week and how often a drinker exceeds daily limits.  Discussed cessation/primary prevention of drug use and availability of treatment for abuse.   Diet: Encouraged to adjust caloric intake to maintain  or achieve ideal body weight, to reduce intake of dietary saturated fat and total fat, to limit sodium intake by avoiding high sodium foods and not adding table salt, and to maintain adequate dietary potassium and calcium preferably from fresh fruits, vegetables, and low-fat dairy products.    stressed the importance of regular exercise  Injury prevention: Discussed safety belts, safety helmets, smoke detector, smoking near bedding or upholstery.   Dental health: Discussed importance of regular tooth brushing, flossing, and dental visits.    NEXT PREVENTATIVE PHYSICAL DUE IN 1 YEAR. Return in about 6 months (around 11/01/2022).

## 2022-05-02 NOTE — Assessment & Plan Note (Signed)
Rechecking labs today. Await results. Treat as needed.  °

## 2022-05-03 LAB — LIPID PANEL W/O CHOL/HDL RATIO
Cholesterol, Total: 239 mg/dL — ABNORMAL HIGH (ref 100–199)
HDL: 83 mg/dL (ref >39)
LDL Chol Calc (NIH): 138 mg/dL — ABNORMAL HIGH (ref 0–99)
Triglycerides: 103 mg/dL (ref 0–149)
VLDL Cholesterol Cal: 18 mg/dL (ref 5–40)

## 2022-05-03 LAB — CBC WITH DIFFERENTIAL/PLATELET
Basophils Absolute: 0 10*3/uL (ref 0.0–0.2)
Basos: 1 %
EOS (ABSOLUTE): 0.1 10*3/uL (ref 0.0–0.4)
Eos: 1 %
Hematocrit: 44 % (ref 34.0–46.6)
Hemoglobin: 14.5 g/dL (ref 11.1–15.9)
Immature Grans (Abs): 0 10*3/uL (ref 0.0–0.1)
Immature Granulocytes: 1 %
Lymphocytes Absolute: 1.4 10*3/uL (ref 0.7–3.1)
Lymphs: 26 %
MCH: 32.1 pg (ref 26.6–33.0)
MCHC: 33 g/dL (ref 31.5–35.7)
MCV: 97 fL (ref 79–97)
Monocytes Absolute: 0.6 10*3/uL (ref 0.1–0.9)
Monocytes: 12 %
Neutrophils Absolute: 3.2 10*3/uL (ref 1.4–7.0)
Neutrophils: 59 %
Platelets: 222 10*3/uL (ref 150–450)
RBC: 4.52 x10E6/uL (ref 3.77–5.28)
RDW: 12.1 % (ref 11.7–15.4)
WBC: 5.3 10*3/uL (ref 3.4–10.8)

## 2022-05-03 LAB — COMPREHENSIVE METABOLIC PANEL
ALT: 17 IU/L (ref 0–32)
AST: 17 IU/L (ref 0–40)
Albumin/Globulin Ratio: 2 (ref 1.2–2.2)
Albumin: 4.4 g/dL (ref 3.8–4.8)
Alkaline Phosphatase: 92 IU/L (ref 44–121)
BUN/Creatinine Ratio: 14 (ref 12–28)
BUN: 12 mg/dL (ref 8–27)
Bilirubin Total: 0.5 mg/dL (ref 0.0–1.2)
CO2: 25 mmol/L (ref 20–29)
Calcium: 9.4 mg/dL (ref 8.7–10.3)
Chloride: 103 mmol/L (ref 96–106)
Creatinine, Ser: 0.88 mg/dL (ref 0.57–1.00)
Globulin, Total: 2.2 g/dL (ref 1.5–4.5)
Glucose: 94 mg/dL (ref 70–99)
Potassium: 4.6 mmol/L (ref 3.5–5.2)
Sodium: 142 mmol/L (ref 134–144)
Total Protein: 6.6 g/dL (ref 6.0–8.5)
eGFR: 69 mL/min/{1.73_m2} (ref >59)

## 2022-05-03 LAB — VITAMIN D 25 HYDROXY (VIT D DEFICIENCY, FRACTURES): Vit D, 25-Hydroxy: 37 ng/mL (ref 30.0–100.0)

## 2022-05-03 LAB — VITAMIN B12: Vitamin B-12: 412 pg/mL (ref 232–1245)

## 2022-05-03 LAB — TSH: TSH: 0.947 u[IU]/mL (ref 0.450–4.500)

## 2022-05-06 ENCOUNTER — Ambulatory Visit: Payer: PPO | Admitting: Family Medicine

## 2022-05-14 NOTE — Pre-Procedure Instructions (Signed)
Surgical Instructions    Your procedure is scheduled on May 21, 2022.  Report to [facility] Main Entrance "A" at 8:30 A.M., then check in with the Admitting office.  Call this number if you have problems the morning of surgery:  [phone]  If you have any questions prior to your surgery date call [phone]: Open Monday-Friday 8am-4pm If you experience any cold or flu symptoms such as cough, fever, chills, shortness of breath, etc. between now and your scheduled surgery, please notify us at the above number.     Remember:  Do not eat after midnight the night before your surgery  You may drink clear liquids until 7:30 AM the morning of your surgery.   Clear liquids allowed are: Water, Non-Citrus Juices (without pulp), Carbonated Beverages, Clear Tea, Black Coffee Only (NO MILK, CREAM OR POWDERED CREAMER of any kind), and Gatorade.     Take these medicines the morning of surgery with A SIP OF WATER:  amLODipine (NORVASC)     As of today, STOP taking any Aspirin (unless otherwise instructed by your surgeon) Aleve, Naproxen, Ibuprofen, Motrin, Advil, Goody's, BC's, all herbal medications, fish oil, and all vitamins.                     Do NOT Smoke (Tobacco/Vaping) for 24 hours prior to your procedure.  If you use a CPAP at night, you may bring your mask/headgear for your overnight stay.   Contacts, glasses, piercing's, hearing aid's, dentures or partials may not be worn into surgery, please bring cases for these belongings.    For patients admitted to the hospital, discharge time will be determined by your treatment team.   Patients discharged the day of surgery will not be allowed to drive home, and someone needs to stay with them for 24 hours.  SURGICAL WAITING ROOM VISITATION Patients having surgery or a procedure may have no more than 2 support people in the waiting area - these visitors may rotate.   Children under the age of 2 must have an adult with them who is not the  patient. If the patient needs to stay at the hospital during part of their recovery, the visitor guidelines for inpatient rooms apply. Pre-op nurse will coordinate an appropriate time for 1 support person to accompany patient in pre-op.  This support person may not rotate.   Please refer to the [facility] website for the visitor guidelines for Inpatients (after your surgery is over and you are in a regular room).    Special instructions:   [facility]- Preparing For Surgery  Before surgery, you can play an important role. Because skin is not sterile, your skin needs to be as free of germs as possible. You can reduce the number of germs on your skin by washing with CHG (chlorahexidine gluconate) Soap before surgery.  CHG is an antiseptic cleaner which kills germs and bonds with the skin to continue killing germs even after washing.    Oral Hygiene is also important to reduce your risk of infection.  Remember - BRUSH YOUR TEETH THE MORNING OF SURGERY WITH YOUR REGULAR TOOTHPASTE  Please do not use if you have an allergy to CHG or antibacterial soaps. If your skin becomes reddened/irritated stop using the CHG.  Do not shave (including legs and underarms) for at least 48 hours prior to first CHG shower. It is OK to shave your face.  Please follow these instructions carefully.   Shower the [employer] [facility] and  the MORNING OF SURGERY  If you chose to wash your hair, wash your hair first as usual with your normal shampoo.  After you shampoo, rinse your hair and body thoroughly to remove the shampoo.  Use CHG Soap as you would any other liquid soap. You can apply CHG directly to the skin and wash gently with a scrungie or a clean washcloth.   Apply the CHG Soap to your body ONLY FROM THE NECK DOWN.  Do not use on open wounds or open sores. Avoid contact with your eyes, ears, mouth and genitals (private parts). Wash Face and genitals (private parts)  with your normal soap.   Wash thoroughly,  paying special attention to the area where your surgery will be performed.  Thoroughly rinse your body with warm water from the neck down.  DO NOT shower/wash with your normal soap after using and rinsing off the CHG Soap.  Pat yourself dry with a CLEAN TOWEL.  Wear CLEAN PAJAMAS to bed the night before surgery  Place CLEAN SHEETS on your bed the night before your surgery  DO NOT SLEEP WITH PETS.   Day of Surgery: Take a shower with CHG soap. Do not wear jewelry or makeup Do not wear lotions, powders, perfumes/colognes, or deodorant. Do not shave 48 hours prior to surgery.  Men may shave face and neck. Do not bring valuables to the hospital.  [facility] is not responsible for any belongings or valuables. Do not wear nail polish, gel polish, artificial nails, or any other type of covering on natural nails (fingers and toes) If you have artificial nails or gel coating that need to be removed by a nail salon, please have this removed prior to surgery. Artificial nails or gel coating may interfere with anesthesia's ability to adequately monitor your vital signs.  Wear Clean/Comfortable clothing the morning of surgery Remember to brush your teeth WITH YOUR REGULAR TOOTHPASTE.   Please read over the following fact sheets that you were given.    If you received a COVID test during your pre-op visit  it is requested that you wear a mask when out in public, stay away from anyone that may not be feeling well and notify your surgeon if you develop symptoms. If you have been in contact with anyone that has tested positive in the last 10 days please notify you surgeon.

## 2022-05-15 ENCOUNTER — Encounter (HOSPITAL_COMMUNITY)
Admission: RE | Admit: 2022-05-15 | Discharge: 2022-05-15 | Disposition: A | Discharge status: Home / Self Care | Payer: PPO | Source: Ambulatory Visit | Attending: General Surgery | Admitting: General Surgery

## 2022-05-15 ENCOUNTER — Encounter (HOSPITAL_COMMUNITY): Payer: Self-pay

## 2022-05-15 ENCOUNTER — Other Ambulatory Visit: Payer: Self-pay

## 2022-05-15 VITALS — BP 159/79 | HR 84 | Temp 97.9°F | Resp 18 | Ht 62.0 in | Wt 179.6 lb

## 2022-05-15 DIAGNOSIS — Z01818 Encounter for other preprocedural examination: Secondary | ICD-10-CM | POA: Insufficient documentation

## 2022-05-15 DIAGNOSIS — R9431 Abnormal electrocardiogram [ECG] [EKG]: Secondary | ICD-10-CM | POA: Insufficient documentation

## 2022-05-15 DIAGNOSIS — I2510 Atherosclerotic heart disease of native coronary artery without angina pectoris: Secondary | ICD-10-CM | POA: Insufficient documentation

## 2022-05-15 HISTORY — DX: Unspecified osteoarthritis, unspecified site: M19.90

## 2022-05-15 HISTORY — DX: Pneumonia, unspecified organism: J18.9

## 2022-05-15 NOTE — Progress Notes (Signed)
PCP - Dr. Olevia Perches Cardiologist - Pt saw Dr. Lorine Bears years ago for her SVT, but her episodes were so infrequent she is now managed by her PCP  PPM/ICD - Denies Device Orders - n/a Rep Notified - na  Chest x-ray - n/a EKG - 05/15/2022 Stress Test - 07/19/2013 ECHO - 07/19/2013 Cardiac Cath - Denies  Sleep Study - Denies CPAP - n/a  No DM  Last dose of GLP1 agonist- n/a GLP1 instructions: n/a  Blood Thinner Instructions: n/a Aspirin Instructions: n/a  ERAS Protcol - Clear liquids until 0730 morning of surgery PRE-SURGERY Ensure or G2- n/a  COVID TEST- n/a   Anesthesia review: Yes. Breast seed placement 5/7 at 0930. Abnormal EKG review.   Patient denies shortness of breath, fever, cough and chest pain at PAT appointment. Pt denies any respiratory illness/infection in the last two months.   All instructions explained to the patient, with a verbal understanding of the material. Patient agrees to go over the instructions while at home for a better understanding. Patient also instructed to self quarantine after being tested for COVID-19. The opportunity to ask questions was provided.

## 2022-05-19 NOTE — H&P (Signed)
PROVIDER:  Matthias Hughs, MD Patient Care Team: None (Inactive) as PCP - General   MRN: [id number] DOB: [birth year] DATE OF ENCOUNTER: 04/28/2022   Plan Chief Complaint: New Consultation (Left breast cancer)       History of Present Illness: Kimberly Klein is a 72 y.o. female who is seen today for breast cancer.   Initial history:     Pt had dx of right breast cancer 03/2020.  She had presented wtih screening detected right breast calcifications.  She also had a left breast mass on screening.  Dx imaging was performed showing 5 cm of calcs in the LOQ on the right.  The left breast mass was around 1 cm.  She had biopsies of the right breast at the anterior and posterior aspect of the calcifications.  She also had the left breast mass sampled.  The left breast mass was benign.  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     She has no prior cancer history before this.  Her brother had bladder cancer and sister with cholangiocarcinoma, brother with multiple myeloma.  She had menarche at age 19.  she is post menopausal.  She is a G3P3 wtih first child at age 48.  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     She underwent bracketed lumpectomy 05/02/2020. She had no invasive cancer seen.  Unfortunately the posterior margin was positive (implant capsule) and several other margins were <1 mm for DCIS.    After discussing multiple options, patient wanted to have reexcision with removal of implants.  I did this in conjunction with Dr. Ulice Bold 07/10/20. After this, all the final margins were negative.       Interval history:    Pt received adjuvant radiation and initially opted out of anastrozole. She developed a breast deformity on the right.   She started anastrozole around 6 months ago after discussing it again with oncology.  Unfortunately, her annual mammogram showed 6.8 cm of indeterminate calcifications  in the left central and outer breast.  She underwent core needle biopsy of the anterior and posterior aspects of this and both showed strongly ER/PR positive intermediate grade DCIS.  So a new diagnosis of LEFT breast cancer 04/2022.       Dx mammogram 04/04/2022   ACR Breast Density Category b: There are scattered areas of fibroglandular density.   FINDINGS: Right breast: A spot 2D magnification view of the lumpectomy site was performed in addition to standard views. There are stable postsurgical changes. No suspicious mass, distortion, or microcalcifications are identified to suggest presence of malignancy.   Left breast: Spot 2D magnification views of the left breast were performed in addition to standard views. There are pleomorphic calcifications in the outer and retroareolar right breast spanning 6.8 cm. There are no additional new findings elsewhere in the left breast.   IMPRESSION: 1. Indeterminate calcifications in the outer and retroareolar left breast spanning 6.8 cm. 2. Benign postsurgical changes in the right breast. No mammographic evidence of malignancy in the right breast.   RECOMMENDATION: Stereotactic core needle biopsy x2 of the left breast targeting the anterior and posterior aspect of the calcifications.   I have discussed the findings and recommendations with the patient. If applicable, a reminder letter will be sent to the patient regarding the next appointment.   BI-RADS CATEGORY  4: Suspicious.     Review of Systems: A complete  review of systems was obtained from the patient.  I have reviewed this information and discussed as appropriate with the patient.  See HPI as well for other ROS.   Review of Systems  All other systems reviewed and are negative.       Medical History: Past Medical History      Past Medical History:  Diagnosis Date   Arrhythmia     Arthritis     History of cancer     Hypertension          Problem List[] Expand by  Default     Patient Active Problem List  Diagnosis   Malignant neoplasm of lower-outer quadrant of right breast of female, estrogen receptor positive (CMS/HHS-HCC)   Cancer of overlapping sites of left breast (CMS/HHS-HCC)        Past Surgical History       Past Surgical History:  Procedure Laterality Date   Right Breast Lumpectomy        05/02/20 and Re-excised 07/11/20        Allergies[] Expand by Default  No Known Allergies     Medications Ordered Prior to Encounter[] Expand by Default        Current Outpatient Medications on File Prior to Visit  Medication Sig Dispense Refill   amLODIPine (NORVASC) 5 MG tablet Take 5 mg by mouth once daily       ergocalciferol, vitamin D2, 1,250 mcg (50,000 unit) capsule Take 50,000 Units by mouth every 7 (seven) days       lisinopriL (ZESTRIL) 20 MG tablet         OZEMPIC 0.25 mg or 0.5 mg(2 mg/1.5 mL) pen injector          No current facility-administered medications on file prior to visit.        Family History[] Expand by Default       Family History  Problem Relation Age of Onset   Obesity Mother     Diabetes Mother     Stroke Father     Skin cancer Father     High blood pressure (Hypertension) Father     Hyperlipidemia (Elevated cholesterol) Father     Coronary Artery Disease (Blocked arteries around heart) Father     Obesity Sister     High blood pressure (Hypertension) Sister     Skin cancer Brother     Obesity Brother     High blood pressure (Hypertension) Brother          Tobacco Use History[] Expand by Default  Social History        Tobacco Use  Smoking Status Former   Current packs/day: 0.00   Types: Cigarettes   Quit date: 2007   Years since quitting: 17.2  Smokeless Tobacco Never        Social History[] Expand by [employer]  Social History         Socioeconomic History   Marital status: Widowed  Tobacco Use   Smoking status: Former      Current packs/day: 0.00      Types: Cigarettes      Quit date:  2007      Years since quitting: 17.2   Smokeless tobacco: Never  Substance and Sexual Activity   Alcohol use: Yes      Comment: occasional win   Drug use: Never    Social Determinants of Health        Financial Resource Strain: Low Risk  (10/31/2021)    Received from [facility]    Overall  Financial Resource Strain (CARDIA)     Difficulty of Paying Living Expenses: Not hard at all  Food Insecurity: No Food Insecurity (08/13/2020)    Received from [facility]    Hunger Vital Sign     Worried About Running Out of Food in the Last Year: Never true     Ran Out of Food in the Last Year: Never true  Transportation Needs: No Transportation Needs (10/31/2021)    Received from [facility] - Transportation     Lack of Transportation (Medical): No     Lack of Transportation (Non-Medical): No  Physical Activity: Insufficiently Active (10/31/2021)    Received from [facility]    Exercise Vital Sign     Days of Exercise per Week: 4 days     Minutes of Exercise per Session: 30 min  Stress: No Stress Concern Present (10/31/2021)    Received from [facility] - Occupational Stress Questionnaire     Feeling of Stress : Not at all  Social Connections: Socially Isolated (10/31/2021)    Received from [facility]    Social Connection and Isolation Panel [NHANES]     Frequency of Communication with Friends and Family: More than three times a week     Frequency of Social Gatherings with Friends and Family: More than three times a week     Attends Religious Services: Never     Database administrator or Organizations: No     Attends Banker Meetings: Never     Marital Status: Widowed        Objective:          Vitals:    04/28/22 1349 04/28/22 1351  BP: (!) 143/88    Pulse: 86    Temp: 36.8 C (98.3 F)    SpO2: 97%    Weight: 79.8 kg (176 lb)    Height: 156.2 cm (5' 1.5")    PainSc:   0-No pain  PainLoc:   Breast     Body mass index is 32.72 kg/m.   Head:   Normocephalic and atraumatic.  Eyes:    Conjunctivae are normal. Pupils are equal, round, and reactive to light. No scleral icterus.  Neck:   Normal range of motion. Neck supple. No tracheal deviation present. No thyromegaly present.  Resp:No respiratory distress, normal effort. Breast:  right breast with deformity inferolaterally. No masses.  No LAD.  Left breast benign exam other than bruising at biopsy site.  No nipple retraction.  Periareolar scar from prior plastics work is present.   Abd:      Abdomen is soft, non distended and non tender. No masses are palpable.  There is no rebound and no guarding.  Neurological: Alert and oriented to person, place, and time. Coordination normal.  Skin:    Skin is warm and dry. No rash noted. No diaphoretic. No erythema. No pallor.  Psychiatric: Normal mood and affect. Normal behavior. Judgment and thought content normal.      Labs, Imaging and Diagnostic Testing:   See above.      Assessment and Plan:    Assessment Diagnoses and all orders for this visit:   Malignant neoplasm of lower-outer quadrant of right breast of female, estrogen receptor positive (CMS/HHS-HCC)   Cancer of overlapping sites of left breast (CMS/HHS-HCC)     Discussed findings.  Pt now with bilateral breast cancer.  She is seeing genetics.  She would  like to try lumpectomy even with the size of 6.8 cm.  I discussed the higher risk of reexcision which she did require last time.  She would likely need XRT also on this side. I discussed MR and the role it could plan in her care and we have decided not to do this.     Dr. Al Pimple has discussed switching to tamoxifen post op, but wants to wait until then to minimize risk of VTE.        The surgical procedure was described to the patient.  I discussed the incision type and location and that we will need radiology involved on with a seed marker.   We discussed the risks bleeding,  infection, damage to other structures, need for further procedures/surgeries.  We discussed the risk of seroma.  The patient was advised if the breast has cancer, we may need to go back to surgery for additional tissue to obtain negative margins or for a lymph node biopsy. The patient was advised that these are the most common complications, but that others can occur as well. I discussed the risk of alteration in breast contour or size.  I discussed risk of chronic pain.  There are rare instances of heart/lung issues post op as well as blood clots.      They were advised against taking aspirin or other anti-inflammatory agents/blood thinners the week before surgery.     The risks and benefits of the procedure were described to the patient and she wishes to proceed.       No follow-ups on file.     Matthias Hughs, MD

## 2022-05-20 ENCOUNTER — Other Ambulatory Visit: Payer: Self-pay | Admitting: General Surgery

## 2022-05-20 ENCOUNTER — Ambulatory Visit
Admission: RE | Admit: 2022-05-20 | Discharge: 2022-05-20 | Disposition: A | Discharge status: Home / Self Care | Payer: PPO | Source: Ambulatory Visit | Attending: General Surgery | Admitting: General Surgery

## 2022-05-20 DIAGNOSIS — C50812 Malignant neoplasm of overlapping sites of left female breast: Secondary | ICD-10-CM

## 2022-05-20 DIAGNOSIS — D0512 Intraductal carcinoma in situ of left breast: Secondary | ICD-10-CM | POA: Diagnosis not present

## 2022-05-20 HISTORY — PX: BREAST BIOPSY: SHX20

## 2022-05-21 ENCOUNTER — Ambulatory Visit
Admission: RE | Admit: 2022-05-21 | Discharge: 2022-05-21 | Disposition: A | Discharge status: Home / Self Care | Payer: PPO | Source: Ambulatory Visit | Attending: General Surgery | Admitting: General Surgery

## 2022-05-21 ENCOUNTER — Other Ambulatory Visit: Payer: Self-pay

## 2022-05-21 ENCOUNTER — Encounter (HOSPITAL_COMMUNITY): Payer: Self-pay | Admitting: General Surgery

## 2022-05-21 ENCOUNTER — Ambulatory Visit (HOSPITAL_COMMUNITY)
Admission: RE | Admit: 2022-05-21 | Discharge: 2022-05-21 | Disposition: A | Discharge status: Home / Self Care | Payer: PPO | Attending: General Surgery | Admitting: General Surgery

## 2022-05-21 ENCOUNTER — Ambulatory Visit (HOSPITAL_BASED_OUTPATIENT_CLINIC_OR_DEPARTMENT_OTHER): Payer: PPO | Admitting: Anesthesiology

## 2022-05-21 ENCOUNTER — Encounter (HOSPITAL_COMMUNITY)
Admission: RE | Disposition: A | Discharge status: Home / Self Care | Payer: Self-pay | Source: Home / Self Care | Attending: General Surgery

## 2022-05-21 ENCOUNTER — Ambulatory Visit (HOSPITAL_COMMUNITY): Payer: PPO | Admitting: Physician Assistant

## 2022-05-21 DIAGNOSIS — C50912 Malignant neoplasm of unspecified site of left female breast: Secondary | ICD-10-CM | POA: Diagnosis not present

## 2022-05-21 DIAGNOSIS — Z807 Family history of other malignant neoplasms of lymphoid, hematopoietic and related tissues: Secondary | ICD-10-CM | POA: Diagnosis not present

## 2022-05-21 DIAGNOSIS — Z79899 Other long term (current) drug therapy: Secondary | ICD-10-CM | POA: Diagnosis not present

## 2022-05-21 DIAGNOSIS — D0512 Intraductal carcinoma in situ of left breast: Secondary | ICD-10-CM | POA: Insufficient documentation

## 2022-05-21 DIAGNOSIS — I10 Essential (primary) hypertension: Secondary | ICD-10-CM | POA: Insufficient documentation

## 2022-05-21 DIAGNOSIS — Z170 Estrogen receptor positive status [ER+]: Secondary | ICD-10-CM | POA: Insufficient documentation

## 2022-05-21 DIAGNOSIS — Z8052 Family history of malignant neoplasm of bladder: Secondary | ICD-10-CM | POA: Insufficient documentation

## 2022-05-21 DIAGNOSIS — N6032 Fibrosclerosis of left breast: Secondary | ICD-10-CM | POA: Diagnosis not present

## 2022-05-21 DIAGNOSIS — Z789 Other specified health status: Secondary | ICD-10-CM

## 2022-05-21 DIAGNOSIS — N6022 Fibroadenosis of left breast: Secondary | ICD-10-CM | POA: Diagnosis not present

## 2022-05-21 DIAGNOSIS — C50812 Malignant neoplasm of overlapping sites of left female breast: Secondary | ICD-10-CM | POA: Diagnosis not present

## 2022-05-21 DIAGNOSIS — Z87891 Personal history of nicotine dependence: Secondary | ICD-10-CM

## 2022-05-21 DIAGNOSIS — C50919 Malignant neoplasm of unspecified site of unspecified female breast: Secondary | ICD-10-CM

## 2022-05-21 DIAGNOSIS — Z86000 Personal history of in-situ neoplasm of breast: Secondary | ICD-10-CM | POA: Diagnosis not present

## 2022-05-21 DIAGNOSIS — Z923 Personal history of irradiation: Secondary | ICD-10-CM | POA: Diagnosis not present

## 2022-05-21 DIAGNOSIS — Z01812 Encounter for preprocedural laboratory examination: Secondary | ICD-10-CM | POA: Diagnosis not present

## 2022-05-21 HISTORY — DX: Other specified health status: Z78.9

## 2022-05-21 HISTORY — PX: BREAST LUMPECTOMY WITH RADIOACTIVE SEED LOCALIZATION: SHX6424

## 2022-05-21 SURGERY — BREAST LUMPECTOMY WITH RADIOACTIVE SEED LOCALIZATION
Anesthesia: General | Site: Breast | Laterality: Left

## 2022-05-21 MED ORDER — ORAL CARE MOUTH RINSE: 15.0000 mL | Freq: Once | OROMUCOSAL | Status: AC

## 2022-05-21 MED ORDER — BUPIVACAINE-EPINEPHRINE (PF) 0.25% -1:200000 IJ SOLN
INTRAMUSCULAR | Status: AC
  Filled 2022-05-21: qty 30

## 2022-05-21 MED ORDER — FENTANYL CITRATE (PF) 100 MCG/2ML IJ SOLN: 25.0000 ug | INTRAMUSCULAR | Status: DC | PRN

## 2022-05-21 MED ORDER — LIDOCAINE HCL 1 % IJ SOLN
INTRAMUSCULAR | Status: AC
  Filled 2022-05-21: qty 20

## 2022-05-21 MED ORDER — LIDOCAINE HCL 1 % IJ SOLN
INTRAMUSCULAR | Status: DC | PRN
  Administered 2022-05-21: 20 mL via INTRAMUSCULAR

## 2022-05-21 MED ORDER — DEXAMETHASONE SODIUM PHOSPHATE 10 MG/ML IJ SOLN
INTRAMUSCULAR | Status: AC
  Filled 2022-05-21: qty 1

## 2022-05-21 MED ORDER — LIDOCAINE 2% (20 MG/ML) 5 ML SYRINGE
INTRAMUSCULAR | Status: DC | PRN
  Administered 2022-05-21: 100 mg via INTRAVENOUS

## 2022-05-21 MED ORDER — ACETAMINOPHEN 500 MG PO TABS
1000.0000 mg | ORAL_TABLET | ORAL | Status: AC
  Administered 2022-05-21: 1000 mg via ORAL
  Filled 2022-05-21: qty 2

## 2022-05-21 MED ORDER — LACTATED RINGERS IV SOLN: INTRAVENOUS | Status: DC

## 2022-05-21 MED ORDER — PROPOFOL 10 MG/ML IV BOLUS
INTRAVENOUS | Status: AC
  Filled 2022-05-21: qty 20

## 2022-05-21 MED ORDER — CHLORHEXIDINE GLUCONATE 0.12 % MT SOLN
15.0000 mL | Freq: Once | OROMUCOSAL | Status: AC
  Administered 2022-05-21: 15 mL via OROMUCOSAL
  Filled 2022-05-21: qty 15

## 2022-05-21 MED ORDER — ROCURONIUM BROMIDE 10 MG/ML (PF) SYRINGE
PREFILLED_SYRINGE | INTRAVENOUS | Status: AC
  Filled 2022-05-21: qty 10

## 2022-05-21 MED ORDER — OXYCODONE HCL 5 MG PO TABS
ORAL_TABLET | ORAL | Status: AC
  Filled 2022-05-21: qty 1

## 2022-05-21 MED ORDER — ONDANSETRON HCL 4 MG/2ML IJ SOLN: 4.0000 mg | Freq: Once | INTRAMUSCULAR | Status: DC | PRN

## 2022-05-21 MED ORDER — MIDAZOLAM HCL 2 MG/2ML IJ SOLN
INTRAMUSCULAR | Status: AC
  Filled 2022-05-21: qty 2

## 2022-05-21 MED ORDER — MIDAZOLAM HCL 2 MG/2ML IJ SOLN
INTRAMUSCULAR | Status: DC | PRN
  Administered 2022-05-21: 2 mg via INTRAVENOUS

## 2022-05-21 MED ORDER — DEXAMETHASONE SODIUM PHOSPHATE 10 MG/ML IJ SOLN
INTRAMUSCULAR | Status: DC | PRN
  Administered 2022-05-21: 10 mg via INTRAVENOUS

## 2022-05-21 MED ORDER — 0.9 % SODIUM CHLORIDE (POUR BTL) OPTIME
TOPICAL | Status: DC | PRN
  Administered 2022-05-21: 1000 mL

## 2022-05-21 MED ORDER — FENTANYL CITRATE (PF) 250 MCG/5ML IJ SOLN
INTRAMUSCULAR | Status: AC
  Filled 2022-05-21: qty 5

## 2022-05-21 MED ORDER — EPHEDRINE SULFATE-NACL 50-0.9 MG/10ML-% IV SOSY
PREFILLED_SYRINGE | INTRAVENOUS | Status: DC | PRN
  Administered 2022-05-21 (×3): 5 mg via INTRAVENOUS

## 2022-05-21 MED ORDER — CEFAZOLIN SODIUM-DEXTROSE 2-4 GM/100ML-% IV SOLN
2.0000 g | INTRAVENOUS | Status: AC
  Administered 2022-05-21: 2 g via INTRAVENOUS
  Filled 2022-05-21: qty 100

## 2022-05-21 MED ORDER — CHLORHEXIDINE GLUCONATE CLOTH 2 % EX PADS: 6.0000 | MEDICATED_PAD | Freq: Once | CUTANEOUS | Status: DC

## 2022-05-21 MED ORDER — OXYCODONE HCL 5 MG/5ML PO SOLN: 5.0000 mg | Freq: Once | ORAL | Status: AC | PRN

## 2022-05-21 MED ORDER — OXYCODONE HCL 5 MG PO TABS: 5.0000 mg | ORAL_TABLET | Freq: Four times a day (QID) | ORAL | 0 refills | Status: DC | PRN

## 2022-05-21 MED ORDER — ONDANSETRON HCL 4 MG/2ML IJ SOLN
INTRAMUSCULAR | Status: AC
  Filled 2022-05-21: qty 2

## 2022-05-21 MED ORDER — PHENYLEPHRINE 80 MCG/ML (10ML) SYRINGE FOR IV PUSH (FOR BLOOD PRESSURE SUPPORT)
PREFILLED_SYRINGE | INTRAVENOUS | Status: DC | PRN
  Administered 2022-05-21 (×4): 160 ug via INTRAVENOUS

## 2022-05-21 MED ORDER — PROPOFOL 10 MG/ML IV BOLUS
INTRAVENOUS | Status: DC | PRN
  Administered 2022-05-21: 150 mg via INTRAVENOUS
  Administered 2022-05-21: 50 mg via INTRAVENOUS

## 2022-05-21 MED ORDER — OXYCODONE HCL 5 MG PO TABS
5.0000 mg | ORAL_TABLET | Freq: Once | ORAL | Status: AC | PRN
  Administered 2022-05-21: 5 mg via ORAL

## 2022-05-21 MED ORDER — FENTANYL CITRATE (PF) 250 MCG/5ML IJ SOLN
INTRAMUSCULAR | Status: DC | PRN
  Administered 2022-05-21 (×3): 50 ug via INTRAVENOUS

## 2022-05-21 MED ORDER — KETOROLAC TROMETHAMINE 30 MG/ML IJ SOLN: 15.0000 mg | Freq: Once | INTRAMUSCULAR | Status: DC | PRN

## 2022-05-21 SURGICAL SUPPLY — 44 items
ADH SKN CLS APL DERMABOND .7 (GAUZE/BANDAGES/DRESSINGS) ×1
APL PRP STRL LF DISP 70% ISPRP (MISCELLANEOUS) ×1
BAG COUNTER SPONGE SURGICOUNT (BAG) ×1 IMPLANT
BAG SPNG CNTER NS LX DISP (BAG) ×1
BINDER BREAST LRG (GAUZE/BANDAGES/DRESSINGS) IMPLANT
BINDER BREAST XLRG (GAUZE/BANDAGES/DRESSINGS) IMPLANT
CANISTER SUCT 3000ML PPV (MISCELLANEOUS) IMPLANT
CHLORAPREP W/TINT 26 (MISCELLANEOUS) ×1 IMPLANT
CLIP TI LARGE 6 (CLIP) ×1 IMPLANT
CLIP TI MEDIUM 6 (CLIP) IMPLANT
COVER PROBE W GEL 5X96 (DRAPES) ×1 IMPLANT
COVER SURGICAL LIGHT HANDLE (MISCELLANEOUS) ×1 IMPLANT
DERMABOND ADVANCED .7 DNX12 (GAUZE/BANDAGES/DRESSINGS) ×1 IMPLANT
DEVICE DUBIN SPECIMEN MAMMOGRA (MISCELLANEOUS) ×1 IMPLANT
DRAPE CHEST BREAST 15X10 FENES (DRAPES) ×1 IMPLANT
ELECT COATED BLADE 2.86 ST (ELECTRODE) ×1 IMPLANT
ELECT REM PT RETURN 9FT ADLT (ELECTROSURGICAL) ×1
ELECTRODE REM PT RTRN 9FT ADLT (ELECTROSURGICAL) ×1 IMPLANT
GAUZE PAD ABD 8X10 STRL (GAUZE/BANDAGES/DRESSINGS) ×1 IMPLANT
GAUZE SPONGE 4X4 12PLY STRL (GAUZE/BANDAGES/DRESSINGS) IMPLANT
GAUZE SPONGE 4X4 12PLY STRL LF (GAUZE/BANDAGES/DRESSINGS) ×1 IMPLANT
GLOVE BIO SURGEON STRL SZ 6 (GLOVE) ×1 IMPLANT
GLOVE INDICATOR 6.5 STRL GRN (GLOVE) ×1 IMPLANT
GOWN STRL REUS W/ TWL LRG LVL3 (GOWN DISPOSABLE) ×1 IMPLANT
GOWN STRL REUS W/ TWL XL LVL3 (GOWN DISPOSABLE) ×1 IMPLANT
GOWN STRL REUS W/TWL LRG LVL3 (GOWN DISPOSABLE)
GOWN STRL REUS W/TWL XL LVL3 (GOWN DISPOSABLE) ×2
KIT BASIN OR (CUSTOM PROCEDURE TRAY) ×1 IMPLANT
KIT MARKER MARGIN INK (KITS) ×1 IMPLANT
LIGHT WAVEGUIDE WIDE FLAT (MISCELLANEOUS) IMPLANT
NDL HYPO 25GX1X1/2 BEV (NEEDLE) ×1 IMPLANT
NEEDLE HYPO 25GX1X1/2 BEV (NEEDLE) ×1 IMPLANT
NS IRRIG 1000ML POUR BTL (IV SOLUTION) IMPLANT
PACK GENERAL/GYN (CUSTOM PROCEDURE TRAY) ×1 IMPLANT
STRIP CLOSURE SKIN 1/2X4 (GAUZE/BANDAGES/DRESSINGS) ×1 IMPLANT
SUT MNCRL AB 4-0 PS2 18 (SUTURE) ×1 IMPLANT
SUT SILK 2 0 SH (SUTURE) IMPLANT
SUT VIC AB 2-0 SH 27 (SUTURE) ×1
SUT VIC AB 2-0 SH 27XBRD (SUTURE) IMPLANT
SUT VIC AB 3-0 SH 27 (SUTURE) ×2
SUT VIC AB 3-0 SH 27X BRD (SUTURE) ×1 IMPLANT
SYR CONTROL 10ML LL (SYRINGE) ×1 IMPLANT
TOWEL GREEN STERILE (TOWEL DISPOSABLE) ×1 IMPLANT
TOWEL GREEN STERILE FF (TOWEL DISPOSABLE) ×1 IMPLANT

## 2022-05-21 NOTE — Transfer of Care (Signed)
Immediate Anesthesia Transfer of Care Note  Patient: Kimberly Klein  Procedure(s) Performed: LEFT BREAST BRACKETED LUMPECTOMY WITH RADIOACTIVE SEED LOCALIZATION (Left: Breast)  Patient Location: PACU  Anesthesia Type:General  Level of Consciousness: awake and alert   Airway & Oxygen Therapy: Patient Spontanous Breathing and Patient connected to nasal cannula oxygen  Post-op Assessment: Report given to RN and Post -op Vital signs reviewed and stable  Post vital signs: Reviewed and stable  Last Vitals:  Vitals Value Taken Time  BP 144/87 05/21/22 1255  Temp    Pulse 91 05/21/22 1256  Resp 23 05/21/22 1256  SpO2 100 % 05/21/22 1256  Vitals shown include unvalidated device data.  Last Pain:  Vitals:   05/21/22 0856  TempSrc:   PainSc: 0-No pain      Patients Stated Pain Goal: 0 (05/21/22 0856)  Complications: No notable events documented.

## 2022-05-21 NOTE — Discharge Instructions (Signed)
[facility] Office Phone Number [phone]  BREAST BIOPSY/ PARTIAL MASTECTOMY: POST OP INSTRUCTIONS  Always review your discharge instruction sheet given to you by the facility where your surgery was performed.  IF YOU HAVE DISABILITY OR FAMILY LEAVE FORMS, YOU MUST BRING THEM TO THE OFFICE FOR PROCESSING.  DO NOT GIVE THEM TO YOUR DOCTOR.  Take 2 tylenol (acetominophen) three times a day for 3 days.  If you still have pain, add ibuprofen with food in between if able to take this (if you have kidney issues or stomach issues, do not take ibuprofen).  If both of those are not enough, add the narcotic pain pill.  If you find you are needing a lot of this overnight after surgery, call the next morning for a refill.    Prescriptions will not be filled after 5pm or on week-ends. Take your usually prescribed medications unless otherwise directed You should eat very light the first 24 hours after surgery, such as soup, crackers, pudding, etc.  Resume your normal diet the day after surgery. Most patients will experience some swelling and bruising in the breast.  Ice packs and a good support bra will help.  Swelling and bruising can take several days to resolve.  It is common to experience some constipation if taking pain medication after surgery.  Increasing fluid intake and taking a stool softener will usually help or prevent this problem from occurring.  A mild laxative (Milk of Magnesia or Miralax) should be taken according to package directions if there are no bowel movements after 48 hours. Unless discharge instructions indicate otherwise, you may remove your bandages 48 hours after surgery, and you may shower at that time.  You may have steri-strips (small skin tapes) in place directly over the incision.  These strips should be left on the skin at least for for 7-10 days.    ACTIVITIES:  You may resume regular daily activities (gradually increasing) beginning the next day.  Wearing a  good support bra or sports bra (or the breast binder) minimizes pain and swelling.  You may have sexual intercourse when it is comfortable. No heavy lifting for 1-2 weeks (not over around 10 pounds).  You may drive when you no longer are taking prescription pain medication, you can comfortably wear a seatbelt, and you can safely maneuver your car and apply brakes. RETURN TO WORK:  __________3-14 days depending on job. _______________ You should see your doctor in the office for a follow-up appointment approximately two weeks after your surgery.  Your doctor's nurse will typically make your follow-up appointment when she calls you with your pathology report.  Expect your pathology report 3-4 business days after your surgery.  You may call to check if you do not hear from us after three days.   WHEN TO CALL YOUR DOCTOR: Fever over 101.0 Nausea and/or vomiting. Extreme swelling or bruising. Continued bleeding from incision. Increased pain, redness, or drainage from the incision.  The clinic staff is available to answer your questions during regular business hours.  Please don't hesitate to call and ask to speak to one of the nurses for clinical concerns.  If you have a medical emergency, go to the nearest emergency room or call 911.  A surgeon from [facility] is always on call at the hospital.  For further questions, please visit [email or web address]   

## 2022-05-21 NOTE — Op Note (Signed)
Left Breast Radioactive seed bracketed lumpectomy  Indications: This patient presents with history of left breast cancer, multiple quadrants, intermediate grade DCIS, receptors +/+  Pre-operative Diagnosis: left breast cancer  Post-operative Diagnosis: Same  Surgeon: Almond Lint   Anesthesia: General endotracheal anesthesia  ASA Class: 2  Procedure Details  The patient was seen in the Holding Room. The risks, benefits, complications, treatment options, and expected outcomes were discussed with the patient. The possibilities of bleeding, infection, the need for additional procedures, failure to diagnose a condition, and creating a complication requiring other procedures or operations were discussed with the patient. The patient concurred with the proposed plan, giving informed consent.  The site of surgery properly noted/marked. The patient was taken to Operating Room # 7, identified, and the procedure verified as left breast seed bracketed lumpectomy.  The left breast and chest were prepped and draped in standard fashion. A inferior circumareolar incision extending laterally was made near the previously placed radioactive seeds.  Dissection was carried down around the points of maximum signal intensity. The cautery was used to perform the dissection.   The specimen was inked with the margin marker paint kit.    Specimen radiography confirmed inclusion of both clips and 3 seeds.  3D mammography appeared to have good margins around the markers.  The background signal in the breast was zero.  Hemostasis was achieved with cautery.  The cavity was marked with clips on each border other than the anterior border.  The wound was irrigated and closed with 3-0 vicryl interrupted deep dermal sutures and 4-0 monocryl running subcuticular suture.   Sterile dressings were applied. At the end of the operation, all sponge, instrument, and needle counts were correct.   Findings: Seeds, clips in specimen.   anterior margin is skin   Estimated Blood Loss:  min         Specimens: left breast tissue with seeds, anterior skin margin         Complications:  None; patient tolerated the procedure well.         Disposition: PACU - hemodynamically stable.         Condition: stable

## 2022-05-21 NOTE — Anesthesia Procedure Notes (Signed)
Procedure Name: LMA Insertion Date/Time: 05/21/2022 11:24 AM  Performed by: Alwyn Ren, CRNAPre-anesthesia Checklist: Patient identified, Emergency Drugs available, Suction available, Patient being monitored and Timeout performed Patient Re-evaluated:Patient Re-evaluated prior to induction Oxygen Delivery Method: Circle system utilized Preoxygenation: Pre-oxygenation with 100% oxygen Induction Type: IV induction LMA: LMA inserted LMA Size: 4.0 Number of attempts: 1 Tube secured with: Tape

## 2022-05-21 NOTE — Interval H&P Note (Signed)
History and Physical Interval Note:  05/21/2022 11:07 AM  Kimberly Klein  has presented today for surgery, with the diagnosis of LEFT BREAST CANCER.  The various methods of treatment have been discussed with the patient and family. After consideration of risks, benefits and other options for treatment, the patient has consented to  Procedure(s): LEFT BREAST BRACKETED LUMPECTOMY WITH RADIOACTIVE SEED LOCALIZATION (Left) as a surgical intervention.  The patient's history has been reviewed, patient examined, no change in status, stable for surgery.  I have reviewed the patient's chart and labs.  Questions were answered to the patient's satisfaction.     Almond Lint

## 2022-05-21 NOTE — Anesthesia Preprocedure Evaluation (Signed)
Anesthesia Evaluation  Patient identified by MRN, date of birth, ID band Patient awake    Reviewed: Allergy & Precautions, H&P , NPO status , Patient's Chart, lab work & pertinent test results  Airway Mallampati: III  TM Distance: <3 FB Neck ROM: Full    Dental no notable dental hx.    Pulmonary neg pulmonary ROS, former smoker   Pulmonary exam normal breath sounds clear to auscultation       Cardiovascular hypertension, Pt. on medications Normal cardiovascular exam+ dysrhythmias Supra Ventricular Tachycardia  Rhythm:Regular Rate:Normal     Neuro/Psych negative neurological ROS  negative psych ROS   GI/Hepatic negative GI ROS, Neg liver ROS,,,  Endo/Other  negative endocrine ROS    Renal/GU negative Renal ROS  negative genitourinary   Musculoskeletal negative musculoskeletal ROS (+)    Abdominal   Peds negative pediatric ROS (+)  Hematology negative hematology ROS (+)   Anesthesia Other Findings   Reproductive/Obstetrics negative OB ROS                             Anesthesia Physical Anesthesia Plan  ASA: 2  Anesthesia Plan: General   Post-op Pain Management: Tylenol PO (pre-op)*   Induction: Intravenous  PONV Risk Score and Plan: 3 and Ondansetron, Dexamethasone and Treatment may vary due to age or medical condition  Airway Management Planned: LMA  Additional Equipment:   Intra-op Plan:   Post-operative Plan: Extubation in OR  Informed Consent: I have reviewed the patients History and Physical, chart, labs and discussed the procedure including the risks, benefits and alternatives for the proposed anesthesia with the patient or authorized representative who has indicated his/her understanding and acceptance.     Dental advisory given  Plan Discussed with: CRNA and Surgeon  Anesthesia Plan Comments:        Anesthesia Quick Evaluation

## 2022-05-21 NOTE — Anesthesia Postprocedure Evaluation (Signed)
Anesthesia Post Note  Patient: Kimberly Klein  Procedure(s) Performed: LEFT BREAST BRACKETED LUMPECTOMY WITH RADIOACTIVE SEED LOCALIZATION (Left: Breast)     Patient location during evaluation: PACU Anesthesia Type: General Level of consciousness: awake and alert Pain management: pain level controlled Vital Signs Assessment: post-procedure vital signs reviewed and stable Respiratory status: spontaneous breathing, nonlabored ventilation, respiratory function stable and patient connected to nasal cannula oxygen Cardiovascular status: blood pressure returned to baseline and stable Postop Assessment: no apparent nausea or vomiting Anesthetic complications: no  No notable events documented.  Last Vitals:  Vitals:   05/21/22 1255 05/21/22 1310  BP: (!) 144/87 129/78  Pulse: 90 86  Resp: (!) 23 17  Temp: 36.6 C   SpO2: 100% 97%    Last Pain:  Vitals:   05/21/22 1255  TempSrc:   PainSc: 3                  Ilda Laskin S

## 2022-05-22 ENCOUNTER — Encounter (HOSPITAL_COMMUNITY): Payer: Self-pay | Admitting: General Surgery

## 2022-05-22 LAB — SURGICAL PATHOLOGY

## 2022-05-26 ENCOUNTER — Telehealth: Payer: Self-pay | Admitting: General Surgery

## 2022-05-26 ENCOUNTER — Other Ambulatory Visit: Payer: Self-pay | Admitting: General Surgery

## 2022-05-26 NOTE — Telephone Encounter (Signed)
Discussed positive margin with patient.  Will need reexcision.  Orders written today.

## 2022-05-28 ENCOUNTER — Encounter (HOSPITAL_BASED_OUTPATIENT_CLINIC_OR_DEPARTMENT_OTHER): Payer: Self-pay | Admitting: General Surgery

## 2022-05-28 ENCOUNTER — Encounter: Payer: Self-pay | Admitting: *Deleted

## 2022-05-28 DIAGNOSIS — D0512 Intraductal carcinoma in situ of left breast: Secondary | ICD-10-CM

## 2022-05-28 NOTE — Progress Notes (Signed)
Spoke w/ via phone for pre-op interview--- pt Lab needs dos----  [facility] results------ current EKG in epic/ chart COVID test -----patient states asymptomatic no test needed Arrive at ------- 0530 on 06-03-2022 NPO after MN NO Solid Food.  Clear liquids from MN until--- 0430 Med rec completed Medications to take morning of surgery ----- norvasc Diabetic medication ----- n/a Patient instructed no nail polish to be worn day of surgery Patient instructed to bring photo id and insurance card day of surgery Patient aware to have Driver (ride ) / caregiver    for 24 hours after surgery -- family, iris Patient Special Instructions ----- n/a Pre-Op special Instructions ----- n/a Patient verbalized understanding of instructions that were given at this phone interview. Patient denies shortness of breath, chest pain, fever, cough at this phone interview.

## 2022-06-02 NOTE — H&P (Signed)
PROVIDER:  Matthias Hughs, MD Patient Care Team: None (Inactive) as PCP - General   MRN: [id number] DOB: 1950/07/09 DATE OF ENCOUNTER: 04/28/2022   Plan Chief Complaint: New Consultation (Left breast cancer)       History of Present Illness: Kimberly Klein is a 72 y.o. female who is seen today for breast cancer.   Initial history:     Pt had dx of right breast cancer 03/2020.  She had presented wtih screening detected right breast calcifications.  She also had a left breast mass on screening.  Dx imaging was performed showing 5 cm of calcs in the LOQ on the right.  The left breast mass was around 1 cm.  She had biopsies of the right breast at the anterior and posterior aspect of the calcifications.  She also had the left breast mass sampled.  The left breast mass was benign.  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     She has no prior cancer history before this.  Her brother had bladder cancer and sister with cholangiocarcinoma, brother with multiple myeloma.  She had menarche at age 75.  she is post menopausal.  She is a G3P3 wtih first child at age 69.  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     She underwent bracketed lumpectomy 05/02/2020. She had no invasive cancer seen.  Unfortunately the posterior margin was positive (implant capsule) and several other margins were <1 mm for DCIS.    After discussing multiple options, patient wanted to have reexcision with removal of implants.  I did this in conjunction with Dr. Ulice Bold 07/10/20. After this, all the final margins were negative.       Interval history:    Pt received adjuvant radiation and initially opted out of anastrozole. She developed a breast deformity on the right.   She started anastrozole around 6 months ago after discussing it again with oncology.  Unfortunately, her annual mammogram showed 6.8 cm of indeterminate calcifications  in the left central and outer breast.  She underwent core needle biopsy of the anterior and posterior aspects of this and both showed strongly ER/PR positive intermediate grade DCIS.  So a new diagnosis of LEFT breast cancer 04/2022.       Dx mammogram 04/04/2022   ACR Breast Density Category b: There are scattered areas of fibroglandular density.   FINDINGS: Right breast: A spot 2D magnification view of the lumpectomy site was performed in addition to standard views. There are stable postsurgical changes. No suspicious mass, distortion, or microcalcifications are identified to suggest presence of malignancy.   Left breast: Spot 2D magnification views of the left breast were performed in addition to standard views. There are pleomorphic calcifications in the outer and retroareolar right breast spanning 6.8 cm. There are no additional new findings elsewhere in the left breast.   IMPRESSION: 1. Indeterminate calcifications in the outer and retroareolar left breast spanning 6.8 cm. 2. Benign postsurgical changes in the right breast. No mammographic evidence of malignancy in the right breast.   RECOMMENDATION: Stereotactic core needle biopsy x2 of the left breast targeting the anterior and posterior aspect of the calcifications.   I have discussed the findings and recommendations with the patient. If applicable, a reminder letter will be sent to the patient regarding the next appointment.   BI-RADS CATEGORY  4: Suspicious.     Review of Systems: A complete  review of systems was obtained from the patient.  I have reviewed this information and discussed as appropriate with the patient.  See HPI as well for other ROS.   Review of Systems  All other systems reviewed and are negative.       Medical History: Past Medical History      Past Medical History:  Diagnosis Date   Arrhythmia     Arthritis     History of cancer     Hypertension          Problem List      Patient Active Problem List  Diagnosis   Malignant neoplasm of lower-outer quadrant of right breast of female, estrogen receptor positive (CMS/HHS-HCC)   Cancer of overlapping sites of left breast (CMS/HHS-HCC)        Past Surgical History       Past Surgical History:  Procedure Laterality Date   Right Breast Lumpectomy        05/02/20 and Re-excised 07/11/20        Allergies  No Known Allergies     Medications Ordered Prior to Encounter        Current Outpatient Medications on File Prior to Visit  Medication Sig Dispense Refill   amLODIPine (NORVASC) 5 MG tablet Take 5 mg by mouth once daily       ergocalciferol, vitamin D2, 1,250 mcg (50,000 unit) capsule Take 50,000 Units by mouth every 7 (seven) days       lisinopriL (ZESTRIL) 20 MG tablet         OZEMPIC 0.25 mg or 0.5 mg(2 mg/1.5 mL) pen injector          No current facility-administered medications on file prior to visit.        Family History       Family History  Problem Relation Age of Onset   Obesity Mother     Diabetes Mother     Stroke Father     Skin cancer Father     High blood pressure (Hypertension) Father     Hyperlipidemia (Elevated cholesterol) Father     Coronary Artery Disease (Blocked arteries around heart) Father     Obesity Sister     High blood pressure (Hypertension) Sister     Skin cancer Brother     Obesity Brother     High blood pressure (Hypertension) Brother          Tobacco Use History  Social History        Tobacco Use  Smoking Status Former   Current packs/day: 0.00   Types: Cigarettes   Quit date: 2007   Years since quitting: 17.2  Smokeless Tobacco Never        Social History  Social History         Socioeconomic History   Marital status: Widowed  Tobacco Use   Smoking status: Former      Current packs/day: 0.00      Types: Cigarettes      Quit date: 2007      Years since quitting: 17.2   Smokeless tobacco: Never  Substance and Sexual Activity    Alcohol use: Yes      Comment: occasional win   Drug use: Never    Social Determinants of Health        Financial Resource Strain: Low Risk  (10/31/2021)    Received from [facility]    Overall Financial Resource Strain (CARDIA)     Difficulty of Paying Living  Expenses: Not hard at all  Food Insecurity: No Food Insecurity (08/13/2020)    Received from [facility]    Hunger Vital Sign     Worried About Running Out of Food in the Last Year: Never true     Ran Out of Food in the Last Year: Never true  Transportation Needs: No Transportation Needs (10/31/2021)    Received from [facility] - Transportation     Lack of Transportation (Medical): No     Lack of Transportation (Non-Medical): No  Physical Activity: Insufficiently Active (10/31/2021)    Received from [facility]    Exercise Vital Sign     Days of Exercise per Week: 4 days     Minutes of Exercise per Session: 30 min  Stress: No Stress Concern Present (10/31/2021)    Received from [facility] - Occupational Stress Questionnaire     Feeling of Stress : Not at all  Social Connections: Socially Isolated (10/31/2021)    Received from [facility]    Social Connection and Isolation Panel [NHANES]     Frequency of Communication with Friends and Family: More than three times a week     Frequency of Social Gatherings with Friends and Family: More than three times a week     Attends Religious Services: Never     Database administrator or Organizations: No     Attends Banker Meetings: Never     Marital Status: Widowed        Objective:          Vitals:    04/28/22 1349 04/28/22 1351  BP: (!) 143/88    Pulse: 86    Temp: 36.8 C (98.3 F)    SpO2: 97%    Weight: 79.8 kg (176 lb)    Height: 156.2 cm (5' 1.5")    PainSc:   0-No pain  PainLoc:   Breast    Body mass index is 32.72 kg/m.   Head:   Normocephalic and atraumatic.  Eyes:    Conjunctivae are  normal. Pupils are equal, round, and reactive to light. No scleral icterus.  Neck:   Normal range of motion. Neck supple. No tracheal deviation present. No thyromegaly present.  Resp:No respiratory distress, normal effort. Breast:  right breast with deformity inferolaterally. No masses.  No LAD.  Left breast benign exam other than bruising at biopsy site.  No nipple retraction.  Periareolar scar from prior plastics work is present.   Abd:      Abdomen is soft, non distended and non tender. No masses are palpable.  There is no rebound and no guarding.  Neurological: Alert and oriented to person, place, and time. Coordination normal.  Skin:    Skin is warm and dry. No rash noted. No diaphoretic. No erythema. No pallor.  Psychiatric: Normal mood and affect. Normal behavior. Judgment and thought content normal.      Labs, Imaging and Diagnostic Testing:   See above.      Assessment and Plan:    Assessment Diagnoses and all orders for this visit:   Malignant neoplasm of lower-outer quadrant of right breast of female, estrogen receptor positive (CMS/HHS-HCC)   Cancer of overlapping sites of left breast (CMS/HHS-HCC)     Discussed findings.  Pt now with bilateral breast cancer.  She is seeing genetics tomorrow.     She would like to try lumpectomy even with the size  of 6.8 cm.  I discussed the higher risk of reexcision which she did require last time.  She would likely need XRT also on this side. I discussed MR and the role it could plan in her care and we have decided not to do this.     Dr. Al Pimple has discussed switching to tamoxifen post op, but wants to wait until then to minimize risk of VTE.        The surgical procedure was described to the patient.  I discussed the incision type and location and that we will need radiology involved on with a seed marker.   We discussed the risks bleeding, infection, damage to other structures, need for further procedures/surgeries.  We discussed the  risk of seroma.  The patient was advised if the breast has cancer, we may need to go back to surgery for additional tissue to obtain negative margins or for a lymph node biopsy. The patient was advised that these are the most common complications, but that others can occur as well. I discussed the risk of alteration in breast contour or size.  I discussed risk of chronic pain.  There are rare instances of heart/lung issues post op as well as blood clots.      They were advised against taking aspirin or other anti-inflammatory agents/blood thinners the week before surgery.     The risks and benefits of the procedure were described to the patient and she wishes to proceed.       No follow-ups on file.     Matthias Hughs, MD

## 2022-06-02 NOTE — Anesthesia Preprocedure Evaluation (Signed)
Anesthesia Evaluation  Patient identified by MRN, date of birth, ID band Patient awake    Reviewed: Allergy & Precautions, H&P , NPO status , Patient's Chart, lab work & pertinent test results  Airway Mallampati: II  TM Distance: >3 FB Neck ROM: Full    Dental no notable dental hx.    Pulmonary neg pulmonary ROS, former smoker   Pulmonary exam normal breath sounds clear to auscultation       Cardiovascular hypertension, negative cardio ROS Normal cardiovascular exam Rhythm:Regular Rate:Normal     Neuro/Psych negative neurological ROS  negative psych ROS   GI/Hepatic Neg liver ROS,GERD  Medicated,,  Endo/Other  negative endocrine ROS    Renal/GU negative Renal ROS  negative genitourinary   Musculoskeletal negative musculoskeletal ROS (+)    Abdominal   Peds negative pediatric ROS (+)  Hematology negative hematology ROS (+)   Anesthesia Other Findings   Reproductive/Obstetrics negative OB ROS                             Anesthesia Physical Anesthesia Plan  ASA: 2  Anesthesia Plan: General   Post-op Pain Management: Minimal or no pain anticipated   Induction: Intravenous  PONV Risk Score and Plan: 3 and Ondansetron, Dexamethasone and Treatment may vary due to age or medical condition  Airway Management Planned: LMA  Additional Equipment:   Intra-op Plan:   Post-operative Plan: Extubation in OR  Informed Consent: I have reviewed the patients History and Physical, chart, labs and discussed the procedure including the risks, benefits and alternatives for the proposed anesthesia with the patient or authorized representative who has indicated his/her understanding and acceptance.     Dental advisory given  Plan Discussed with: CRNA and Surgeon  Anesthesia Plan Comments:        Anesthesia Quick Evaluation

## 2022-06-03 ENCOUNTER — Encounter (HOSPITAL_BASED_OUTPATIENT_CLINIC_OR_DEPARTMENT_OTHER): Payer: Self-pay | Admitting: General Surgery

## 2022-06-03 ENCOUNTER — Other Ambulatory Visit: Payer: Self-pay

## 2022-06-03 ENCOUNTER — Ambulatory Visit (HOSPITAL_BASED_OUTPATIENT_CLINIC_OR_DEPARTMENT_OTHER): Payer: PPO | Admitting: Anesthesiology

## 2022-06-03 ENCOUNTER — Ambulatory Visit (HOSPITAL_BASED_OUTPATIENT_CLINIC_OR_DEPARTMENT_OTHER)
Admission: RE | Admit: 2022-06-03 | Discharge: 2022-06-03 | Disposition: A | Discharge status: Home / Self Care | Payer: PPO | Attending: General Surgery | Admitting: General Surgery

## 2022-06-03 ENCOUNTER — Encounter (HOSPITAL_BASED_OUTPATIENT_CLINIC_OR_DEPARTMENT_OTHER)
Admission: RE | Disposition: A | Discharge status: Home / Self Care | Payer: Self-pay | Source: Home / Self Care | Attending: General Surgery

## 2022-06-03 DIAGNOSIS — I10 Essential (primary) hypertension: Secondary | ICD-10-CM | POA: Diagnosis not present

## 2022-06-03 DIAGNOSIS — Z170 Estrogen receptor positive status [ER+]: Secondary | ICD-10-CM | POA: Diagnosis not present

## 2022-06-03 DIAGNOSIS — K219 Gastro-esophageal reflux disease without esophagitis: Secondary | ICD-10-CM | POA: Insufficient documentation

## 2022-06-03 DIAGNOSIS — Z780 Asymptomatic menopausal state: Secondary | ICD-10-CM | POA: Diagnosis not present

## 2022-06-03 DIAGNOSIS — Z01818 Encounter for other preprocedural examination: Secondary | ICD-10-CM

## 2022-06-03 DIAGNOSIS — Z8052 Family history of malignant neoplasm of bladder: Secondary | ICD-10-CM | POA: Insufficient documentation

## 2022-06-03 DIAGNOSIS — D0512 Intraductal carcinoma in situ of left breast: Secondary | ICD-10-CM | POA: Diagnosis not present

## 2022-06-03 DIAGNOSIS — Z09 Encounter for follow-up examination after completed treatment for conditions other than malignant neoplasm: Secondary | ICD-10-CM | POA: Diagnosis not present

## 2022-06-03 DIAGNOSIS — C50512 Malignant neoplasm of lower-outer quadrant of left female breast: Secondary | ICD-10-CM | POA: Diagnosis not present

## 2022-06-03 DIAGNOSIS — Z87891 Personal history of nicotine dependence: Secondary | ICD-10-CM | POA: Diagnosis not present

## 2022-06-03 DIAGNOSIS — Z800 Family history of malignant neoplasm of digestive organs: Secondary | ICD-10-CM | POA: Diagnosis not present

## 2022-06-03 DIAGNOSIS — C50812 Malignant neoplasm of overlapping sites of left female breast: Secondary | ICD-10-CM | POA: Diagnosis not present

## 2022-06-03 DIAGNOSIS — C50912 Malignant neoplasm of unspecified site of left female breast: Secondary | ICD-10-CM | POA: Diagnosis not present

## 2022-06-03 HISTORY — DX: Presence of spectacles and contact lenses: Z97.3

## 2022-06-03 HISTORY — DX: Personal history of colonic polyps: Z86.010

## 2022-06-03 HISTORY — DX: Personal history of adenomatous and serrated colon polyps: Z86.0101

## 2022-06-03 HISTORY — DX: Unspecified macular degeneration: H35.30

## 2022-06-03 HISTORY — DX: Vitamin D deficiency, unspecified: E55.9

## 2022-06-03 HISTORY — DX: Supraventricular tachycardia, unspecified: I47.10

## 2022-06-03 HISTORY — PX: RE-EXCISION OF BREAST LUMPECTOMY: SHX6048

## 2022-06-03 LAB — POCT I-STAT, CHEM 8
BUN: 12 mg/dL (ref 8–23)
Calcium, Ion: 1.26 mmol/L (ref 1.15–1.40)
Chloride: 105 mmol/L (ref 98–111)
Creatinine, Ser: 0.8 mg/dL (ref 0.44–1.00)
Glucose, Bld: 99 mg/dL (ref 70–99)
HCT: 44 % (ref 36.0–46.0)
Hemoglobin: 15 g/dL (ref 12.0–15.0)
Potassium: 4.2 mmol/L (ref 3.5–5.1)
Sodium: 142 mmol/L (ref 135–145)
TCO2: 26 mmol/L (ref 22–32)

## 2022-06-03 SURGERY — EXCISION, LESION, BREAST
Anesthesia: General | Site: Breast | Laterality: Left

## 2022-06-03 MED ORDER — PHENYLEPHRINE HCL (PRESSORS) 10 MG/ML IV SOLN
INTRAVENOUS | Status: DC | PRN
  Administered 2022-06-03: 80 ug via INTRAVENOUS

## 2022-06-03 MED ORDER — PROPOFOL 10 MG/ML IV BOLUS
INTRAVENOUS | Status: AC
  Filled 2022-06-03: qty 20

## 2022-06-03 MED ORDER — DEXMEDETOMIDINE HCL IN NACL 80 MCG/20ML IV SOLN
INTRAVENOUS | Status: AC
  Filled 2022-06-03: qty 20

## 2022-06-03 MED ORDER — FENTANYL CITRATE (PF) 100 MCG/2ML IJ SOLN
INTRAMUSCULAR | Status: DC | PRN
  Administered 2022-06-03 (×2): 25 ug via INTRAVENOUS
  Administered 2022-06-03: 50 ug via INTRAVENOUS

## 2022-06-03 MED ORDER — ACETAMINOPHEN 500 MG PO TABS
ORAL_TABLET | ORAL | Status: AC
  Filled 2022-06-03: qty 2

## 2022-06-03 MED ORDER — ACETAMINOPHEN 500 MG PO TABS
1000.0000 mg | ORAL_TABLET | ORAL | Status: AC
  Administered 2022-06-03: 1000 mg via ORAL

## 2022-06-03 MED ORDER — FENTANYL CITRATE (PF) 100 MCG/2ML IJ SOLN
INTRAMUSCULAR | Status: AC
  Filled 2022-06-03: qty 2

## 2022-06-03 MED ORDER — ONDANSETRON HCL 4 MG/2ML IJ SOLN
INTRAMUSCULAR | Status: AC
  Filled 2022-06-03: qty 2

## 2022-06-03 MED ORDER — CEFAZOLIN SODIUM-DEXTROSE 2-4 GM/100ML-% IV SOLN
INTRAVENOUS | Status: AC
  Filled 2022-06-03: qty 100

## 2022-06-03 MED ORDER — CHLORHEXIDINE GLUCONATE CLOTH 2 % EX PADS: 6.0000 | MEDICATED_PAD | Freq: Once | CUTANEOUS | Status: DC

## 2022-06-03 MED ORDER — LIDOCAINE 2% (20 MG/ML) 5 ML SYRINGE
INTRAMUSCULAR | Status: DC | PRN
  Administered 2022-06-03: 100 mg via INTRAVENOUS

## 2022-06-03 MED ORDER — DEXAMETHASONE SODIUM PHOSPHATE 10 MG/ML IJ SOLN
INTRAMUSCULAR | Status: AC
  Filled 2022-06-03: qty 1

## 2022-06-03 MED ORDER — OXYCODONE HCL 5 MG PO TABS
5.0000 mg | ORAL_TABLET | Freq: Four times a day (QID) | ORAL | 0 refills | Status: DC | PRN
Start: 2022-06-03 — End: 2022-07-09

## 2022-06-03 MED ORDER — DEXAMETHASONE SODIUM PHOSPHATE 10 MG/ML IJ SOLN
INTRAMUSCULAR | Status: DC | PRN
  Administered 2022-06-03: 10 mg via INTRAVENOUS

## 2022-06-03 MED ORDER — LACTATED RINGERS IV SOLN: INTRAVENOUS | Status: DC

## 2022-06-03 MED ORDER — 0.9 % SODIUM CHLORIDE (POUR BTL) OPTIME
TOPICAL | Status: DC | PRN
  Administered 2022-06-03: 500 mL

## 2022-06-03 MED ORDER — ONDANSETRON HCL 4 MG/2ML IJ SOLN
INTRAMUSCULAR | Status: DC | PRN
  Administered 2022-06-03: 4 mg via INTRAVENOUS

## 2022-06-03 MED ORDER — PROPOFOL 10 MG/ML IV BOLUS
INTRAVENOUS | Status: DC | PRN
  Administered 2022-06-03: 10 mg via INTRAVENOUS
  Administered 2022-06-03: 190 mg via INTRAVENOUS

## 2022-06-03 MED ORDER — LIDOCAINE-EPINEPHRINE (PF) 1 %-1:200000 IJ SOLN
INTRAMUSCULAR | Status: DC | PRN
  Administered 2022-06-03: 60 mL

## 2022-06-03 MED ORDER — CEFAZOLIN SODIUM-DEXTROSE 2-4 GM/100ML-% IV SOLN
2.0000 g | INTRAVENOUS | Status: AC
  Administered 2022-06-03: 2 g via INTRAVENOUS

## 2022-06-03 MED ORDER — LIDOCAINE HCL (PF) 2 % IJ SOLN
INTRAMUSCULAR | Status: AC
  Filled 2022-06-03: qty 5

## 2022-06-03 SURGICAL SUPPLY — 44 items
ADH SKN CLS APL DERMABOND .7 (GAUZE/BANDAGES/DRESSINGS) ×1
APL PRP STRL LF DISP 70% ISPRP (MISCELLANEOUS) ×1
BAG COUNTER SPONGE SURGICOUNT (BAG) ×1 IMPLANT
BAG SPNG CNTER NS LX DISP (BAG)
BINDER BREAST LRG (GAUZE/BANDAGES/DRESSINGS) IMPLANT
BINDER BREAST XLRG (GAUZE/BANDAGES/DRESSINGS) IMPLANT
BLADE SURG 10 STRL SS (BLADE) IMPLANT
CHLORAPREP W/TINT 26 (MISCELLANEOUS) ×1 IMPLANT
CLIP TI LARGE 6 (CLIP) ×1 IMPLANT
COVER BACK TABLE 60X90IN (DRAPES) ×1 IMPLANT
COVER MAYO STAND STRL (DRAPES) ×1 IMPLANT
COVER PROBE W GEL 5X96 (DRAPES) ×1 IMPLANT
DERMABOND ADVANCED .7 DNX12 (GAUZE/BANDAGES/DRESSINGS) ×1 IMPLANT
DRAPE LAPAROTOMY 100X72 PEDS (DRAPES) ×1 IMPLANT
ELECT COATED BLADE 2.86 ST (ELECTRODE) ×1 IMPLANT
ELECT REM PT RETURN 9FT ADLT (ELECTROSURGICAL) ×1
ELECTRODE REM PT RTRN 9FT ADLT (ELECTROSURGICAL) ×1 IMPLANT
GAUZE PAD ABD 8X10 STRL (GAUZE/BANDAGES/DRESSINGS) ×1 IMPLANT
GAUZE SPONGE 4X4 12PLY STRL LF (GAUZE/BANDAGES/DRESSINGS) ×1 IMPLANT
GLOVE BIO SURGEON STRL SZ 6 (GLOVE) ×1 IMPLANT
GLOVE INDICATOR 6.5 STRL GRN (GLOVE) ×1 IMPLANT
GOWN STRL REUS W/ TWL LRG LVL3 (GOWN DISPOSABLE) ×1 IMPLANT
GOWN STRL REUS W/ TWL XL LVL3 (GOWN DISPOSABLE) ×1 IMPLANT
GOWN STRL REUS W/TWL LRG LVL3 (GOWN DISPOSABLE) ×3
GOWN STRL REUS W/TWL XL LVL3 (GOWN DISPOSABLE) ×1
KIT TURNOVER CYSTO (KITS) ×1 IMPLANT
MANIFOLD NEPTUNE II (INSTRUMENTS) ×1 IMPLANT
NDL HYPO 25X1 1.5 SAFETY (NEEDLE) IMPLANT
NEEDLE HYPO 25X1 1.5 SAFETY (NEEDLE) ×1 IMPLANT
NS IRRIG 1000ML POUR BTL (IV SOLUTION) IMPLANT
PACK BASIN DAY SURGERY FS (CUSTOM PROCEDURE TRAY) ×1 IMPLANT
PENCIL SMOKE EVACUATOR (MISCELLANEOUS) ×1 IMPLANT
SPONGE T-LAP 4X18 ~~LOC~~+RFID (SPONGE) ×1 IMPLANT
STRIP CLOSURE SKIN 1/2X4 (GAUZE/BANDAGES/DRESSINGS) ×1 IMPLANT
SUT MNCRL AB 4-0 PS2 18 (SUTURE) ×1 IMPLANT
SUT SILK 2 0 SH (SUTURE) IMPLANT
SUT VIC AB 2-0 SH 27 (SUTURE) ×1
SUT VIC AB 2-0 SH 27XBRD (SUTURE) IMPLANT
SUT VIC AB 3-0 SH 27 (SUTURE) ×1
SUT VIC AB 3-0 SH 27X BRD (SUTURE) ×1 IMPLANT
SYR CONTROL 10ML LL (SYRINGE) ×1 IMPLANT
TOWEL OR 17X24 6PK STRL BLUE (TOWEL DISPOSABLE) ×1 IMPLANT
TUBE CONNECTING 12X1/4 (SUCTIONS) IMPLANT
YANKAUER SUCT BULB TIP NO VENT (SUCTIONS) IMPLANT

## 2022-06-03 NOTE — Interval H&P Note (Signed)
History and Physical Interval Note:  06/03/2022 7:36 AM  Kimberly Klein  has presented today for surgery, with the diagnosis of LEFT BREAST CANCER.  The various methods of treatment have been discussed with the patient and family. After consideration of risks, benefits and other options for treatment, the patient has consented to  Procedure(s): RE-EXCISION OF LEFT BREAST LUMPECTOMY (Left) as a surgical intervention.  The patient's history has been reviewed, patient examined, no change in status, stable for surgery.  I have reviewed the patient's chart and labs.  Questions were answered to the patient's satisfaction.     Almond Lint

## 2022-06-03 NOTE — Anesthesia Postprocedure Evaluation (Signed)
Anesthesia Post Note  Patient: Kimberly Klein  Procedure(s) Performed: RE-EXCISION OF LEFT BREAST LUMPECTOMY (Left: Breast)     Patient location during evaluation: PACU Anesthesia Type: General Level of consciousness: awake and alert Pain management: pain level controlled Vital Signs Assessment: post-procedure vital signs reviewed and stable Respiratory status: spontaneous breathing, nonlabored ventilation, respiratory function stable and patient connected to nasal cannula oxygen Cardiovascular status: blood pressure returned to baseline and stable Postop Assessment: no apparent nausea or vomiting Anesthetic complications: no  No notable events documented.  Last Vitals:  Vitals:   06/03/22 0900 06/03/22 0902  BP:  130/78  Pulse: 77 75  Resp: 17 18  Temp:  36.7 C  SpO2: 100% 98%    Last Pain:  Vitals:   06/03/22 0900  TempSrc:   PainSc: 0-No pain                 Robi Dewolfe S

## 2022-06-03 NOTE — Discharge Instructions (Addendum)
[facility] Office Phone Number [phone]  BREAST BIOPSY/ PARTIAL MASTECTOMY: POST OP INSTRUCTIONS  Always review your discharge instruction sheet given to you by the facility where your surgery was performed.  IF YOU HAVE DISABILITY OR FAMILY LEAVE FORMS, YOU MUST BRING THEM TO THE OFFICE FOR PROCESSING.  DO NOT GIVE THEM TO YOUR DOCTOR.  Take 2 tylenol (acetominophen) three times a day for 3 days.  If you still have pain, add ibuprofen with food in between if able to take this (if you have kidney issues or stomach issues, do not take ibuprofen).  If both of those are not enough, add the narcotic pain pill.  If you find you are needing a lot of this overnight after surgery, call the next morning for a refill.    Prescriptions will not be filled after 5pm or on week-ends. Take your usually prescribed medications unless otherwise directed You should eat very light the first 24 hours after surgery, such as soup, crackers, pudding, etc.  Resume your normal diet the day after surgery. Most patients will experience some swelling and bruising in the breast.  Ice packs and a good support bra will help.  Swelling and bruising can take several days to resolve.  It is common to experience some constipation if taking pain medication after surgery.  Increasing fluid intake and taking a stool softener will usually help or prevent this problem from occurring.  A mild laxative (Milk of Magnesia or Miralax) should be taken according to package directions if there are no bowel movements after 48 hours. Unless discharge instructions indicate otherwise, you may remove your bandages 48 hours after surgery, and you may shower at that time.  You may have steri-strips (small skin tapes) in place directly over the incision.  These strips should be left on the skin at least for for 7-10 days.    ACTIVITIES:  You may resume regular daily activities (gradually increasing) beginning the next day.  Wearing a  good support bra or sports bra (or the breast binder) minimizes pain and swelling.  You may have sexual intercourse when it is comfortable. No heavy lifting for 1-2 weeks (not over around 10 pounds).  You may drive when you no longer are taking prescription pain medication, you can comfortably wear a seatbelt, and you can safely maneuver your car and apply brakes. RETURN TO WORK:  __________3-14 days depending on job. _______________ Bonita Quin should see your doctor in the office for a follow-up appointment approximately two weeks after your surgery.  Your doctor's nurse will typically make your follow-up appointment when she calls you with your pathology report.  Expect your pathology report 3-4 business days after your surgery.  You may call to check if you do not hear from [place] after three days.   WHEN TO CALL YOUR DOCTOR: Fever over 101.0 Nausea and/or vomiting. Extreme swelling or bruising. Continued bleeding from incision. Increased pain, redness, or drainage from the incision.  The clinic staff is available to answer your questions during regular business hours.  Please don't hesitate to call and ask to speak to one of the nurses for clinical concerns.  If you have a medical emergency, go to the nearest emergency room or call 911.  A surgeon from [facility] is always on call at the hospital.  For further questions, please visit [email or web address]   Post Anesthesia Home Care Instructions  Activity: Get plenty of rest for the remainder of the day. A responsible adult should stay with  you for 24 hours following the procedure.  For the next 24 hours, DO NOT: -Drive a car -Advertising copywriter -Drink alcoholic beverages -Take any medication unless instructed by your physician -Make any legal decisions or sign important papers.  Meals: Start with liquid foods such as gelatin or soup. Progress to regular foods as tolerated. Avoid greasy, spicy, heavy foods. If nausea and/or  vomiting occur, drink only clear liquids until the nausea and/or vomiting subsides. Call your physician if vomiting continues.  Special Instructions/Symptoms: Your throat may feel dry or sore from the anesthesia or the breathing tube placed in your throat during surgery. If this causes discomfort, gargle with warm salt water. The discomfort should disappear within 24 hours.

## 2022-06-03 NOTE — Anesthesia Procedure Notes (Signed)
Procedure Name: LMA Insertion Date/Time: 06/03/2022 7:48 AM  Performed by: Alece Koppel D, CRNAPre-anesthesia Checklist: Patient identified, Emergency Drugs available, Suction available and Patient being monitored Patient Re-evaluated:Patient Re-evaluated prior to induction Oxygen Delivery Method: Circle system utilized Preoxygenation: Pre-oxygenation with 100% oxygen Induction Type: IV induction Ventilation: Mask ventilation without difficulty LMA: LMA inserted LMA Size: 4.0 Tube type: Oral Number of attempts: 1 Placement Confirmation: positive ETCO2 and breath sounds checked- equal and bilateral Tube secured with: Tape Dental Injury: Teeth and Oropharynx as per pre-operative assessment

## 2022-06-03 NOTE — Transfer of Care (Signed)
Immediate Anesthesia Transfer of Care Note  Patient: Kimberly Klein  Procedure(s) Performed: RE-EXCISION OF LEFT BREAST LUMPECTOMY (Left: Breast)  Patient Location: PACU  Anesthesia Type:General  Level of Consciousness: awake, alert , and oriented  Airway & Oxygen Therapy: Patient Spontanous Breathing and Patient connected to nasal cannula oxygen  Post-op Assessment: Report given to RN and Post -op Vital signs reviewed and stable  Post vital signs: Reviewed and stable  Last Vitals:  Vitals Value Taken Time  BP 129/95 06/03/22 0841  Temp 36.7 C 06/03/22 0842  Pulse 81 06/03/22 0842  Resp 17 06/03/22 0842  SpO2 100 % 06/03/22 0842  Vitals shown include unvalidated device data.  Last Pain:  Vitals:   06/03/22 0605  TempSrc: Oral  PainSc: 0-No pain      Patients Stated Pain Goal: 3 (06/03/22 2536)  Complications: No notable events documented.

## 2022-06-03 NOTE — Op Note (Signed)
Re-excisional left Breast Lumpectomy   Indications: This patient presents with history of positive margins after partial mastectomy for left breast cancer   Pre-operative Diagnosis: left breast cancer, pTis, lower outer quadrant, intermediate grade DCIS  Post-operative Diagnosis: left breast cancer   Surgeon: Almond Lint   Assistants: n/a   Anesthesia: General anesthesia and Local anesthesia   ASA Class: 2   Procedure Details  The patient was seen in the Holding Room. The risks, benefits, complications, treatment options, and expected outcomes were discussed with the patient. The possibilities of reaction to medication, pulmonary aspiration, bleeding, infection, the need for additional procedures, failure to diagnose a condition, and creating a complication requiring transfusion or operation were discussed with the patient. The patient concurred with the proposed plan, giving informed consent. The site of surgery properly noted/marked. The patient was taken to Operating Room # 1, identified, and the procedure verified as re-excision of left breast cancer.  After induction of anesthesia, the left breast and chest were prepped and draped in standard fashion.  The lumpectomy was performed by reopening the prior incision. Seroma was aspirated. The mastopexy sutures were removed. Additional margins were taken at the posterior borders of the partial mastectomy cavity. Dissection was carried down to the pectoral fascia. Orientation sutures were placed in the specimens. Hemostasis was achieved with cautery. The wound was irrigated and closed with a 3-0 Vicryl deep dermal interrupted and a 4-0 Monocryl subcuticular closure in layers.  Sterile dressings were applied. At the end of the operation, all sponge, instrument, and needle counts were correct.   Findings:  grossly clear surgical margins, posterior margin is now pectoralis  Estimated Blood Loss: Minimal   Drains: none   Specimens:  additional posterior margins.   Complications: None; patient tolerated the procedure well.   Disposition: PACU - hemodynamically stable.   Condition: stable

## 2022-06-04 ENCOUNTER — Encounter: Payer: Self-pay | Admitting: Family Medicine

## 2022-06-05 ENCOUNTER — Encounter (HOSPITAL_BASED_OUTPATIENT_CLINIC_OR_DEPARTMENT_OTHER): Payer: Self-pay | Admitting: General Surgery

## 2022-06-05 ENCOUNTER — Other Ambulatory Visit (HOSPITAL_COMMUNITY): Payer: Self-pay

## 2022-06-05 ENCOUNTER — Other Ambulatory Visit: Payer: Self-pay | Admitting: Family Medicine

## 2022-06-05 LAB — SURGICAL PATHOLOGY

## 2022-06-05 MED ORDER — AMLODIPINE BESYLATE 5 MG PO TABS
5.0000 mg | ORAL_TABLET | Freq: Every day | ORAL | 1 refills | Status: DC
  Filled 2022-06-05: qty 90, 90d supply, fill #0
  Filled 2022-10-24 (×2): qty 90, 90d supply, fill #1

## 2022-06-05 MED ORDER — LISINOPRIL 20 MG PO TABS
20.0000 mg | ORAL_TABLET | Freq: Every day | ORAL | 1 refills | Status: DC
  Filled 2022-06-05: qty 90, 90d supply, fill #0
  Filled 2022-10-24 (×2): qty 90, 90d supply, fill #1

## 2022-06-06 ENCOUNTER — Telehealth: Payer: Self-pay | Admitting: General Surgery

## 2022-06-10 ENCOUNTER — Encounter: Payer: Self-pay | Admitting: Nurse Practitioner

## 2022-06-11 ENCOUNTER — Encounter: Payer: Self-pay | Admitting: *Deleted

## 2022-06-11 DIAGNOSIS — D0512 Intraductal carcinoma in situ of left breast: Secondary | ICD-10-CM

## 2022-06-16 DIAGNOSIS — Z0289 Encounter for other administrative examinations: Secondary | ICD-10-CM

## 2022-06-18 ENCOUNTER — Other Ambulatory Visit: Payer: Self-pay

## 2022-06-18 ENCOUNTER — Inpatient Hospital Stay: Payer: PPO | Attending: Hematology and Oncology | Admitting: Hematology and Oncology

## 2022-06-18 VITALS — BP 137/74 | HR 93 | Temp 97.9°F | Resp 16 | Ht 62.0 in | Wt 183.0 lb

## 2022-06-18 DIAGNOSIS — D0511 Intraductal carcinoma in situ of right breast: Secondary | ICD-10-CM | POA: Insufficient documentation

## 2022-06-18 DIAGNOSIS — Z800 Family history of malignant neoplasm of digestive organs: Secondary | ICD-10-CM | POA: Diagnosis not present

## 2022-06-18 DIAGNOSIS — D0512 Intraductal carcinoma in situ of left breast: Secondary | ICD-10-CM

## 2022-06-18 DIAGNOSIS — Z87891 Personal history of nicotine dependence: Secondary | ICD-10-CM | POA: Diagnosis not present

## 2022-06-18 DIAGNOSIS — Z807 Family history of other malignant neoplasms of lymphoid, hematopoietic and related tissues: Secondary | ICD-10-CM | POA: Insufficient documentation

## 2022-06-18 DIAGNOSIS — Z79811 Long term (current) use of aromatase inhibitors: Secondary | ICD-10-CM | POA: Insufficient documentation

## 2022-06-18 DIAGNOSIS — Z8052 Family history of malignant neoplasm of bladder: Secondary | ICD-10-CM | POA: Diagnosis not present

## 2022-06-18 NOTE — Progress Notes (Signed)
BRIEF ONCOLOGIC HISTORY:  Oncology History  Ductal carcinoma in situ (DCIS) of right breast  03/28/2020 Initial Diagnosis   [place] woman status post right breast biopsy x2 on 03/28/2020 for ductal carcinoma in situ measuring approximately 5 cm, grade 2 or 3, estrogen receptor weakly positive at 20%, progesterone receptor negative   04/04/2020 Cancer Staging   Staging form: Breast, AJCC 8th Edition - Clinical stage from 04/04/2020: Stage 0 (cTis (DCIS), cN0, cM0, ER+, PR-) - Signed by Lowella Dell, MD on 04/04/2020 Stage prefix: Initial diagnosis Nuclear grade: GX Laterality: Right Staged by: Pathologist and managing physician Stage used in treatment planning: Yes National guidelines used in treatment planning: Yes Type of national guideline used in treatment planning: NCCN   05/02/2020 Surgery   status post right lumpectomy 05/02/2020 for a 6.7 cm high-grade ductal carcinoma in situ, with focally positive margins.             (a) additional surgery 07/11/2020 obtained at negative margins.   08/20/2020 - 10/04/2020 Radiation Therapy   08/20/2020 through 10/04/2020 Site Technique Total Dose (Gy) Dose per Fx (Gy) Completed Fx Beam Energies  Breast, Right: Breast_Rt 3D 50.4/50.4 1.8 28/28 6X, 10X  Breast, Right: Breast_Rt_Bst 3D 10/10 2 5/5 6X, 10X    11/29/2020 -  Anti-estrogen oral therapy   Anastrozole daily   01/18/2021 Cancer Staging   Staging form: Breast, AJCC 8th Edition - Pathologic: Stage 0 (pTis (DCIS), pN0, cM0, ER+, PR-) - Signed by Loa Socks, NP on 01/18/2021     INTERVAL HISTORY:   Kimberly Klein is here for follow-up.   Since her last visit here she had left breast lumpectomy which showed extensive intermediate to high-grade DCIS with necrosis and calcs partially involving foci of sclerosing adenosis and occupying a fibrotic area of about 5.3 cm margins negative after reexcision.  Prognostics for this DCIS showed ER 20% positive strong staining and PR 20%  positive strong staining.  She was on anastrozole at the time of this DCIS diagnosis. She denies any complaints today.  REVIEW OF SYSTEMS:  Review of Systems  Constitutional:  Negative for appetite change, chills, fatigue, fever and unexpected weight change.  HENT:   Negative for hearing loss, lump/mass and trouble swallowing.   Eyes:  Negative for eye problems and icterus.  Respiratory:  Negative for chest tightness, cough and shortness of breath.   Cardiovascular:  Negative for chest pain, leg swelling and palpitations.  Gastrointestinal:  Negative for abdominal distention, abdominal pain, constipation, diarrhea, nausea and vomiting.  Endocrine: Negative for hot flashes.  Genitourinary:  Negative for difficulty urinating.   Musculoskeletal:  Negative for arthralgias.  Skin:  Negative for itching and rash.  Neurological:  Negative for dizziness, extremity weakness, headaches and numbness.  Hematological:  Negative for adenopathy. Does not bruise/bleed easily.  Psychiatric/Behavioral:  Negative for depression. The patient is not nervous/anxious.    Breast: Denies any new nodularity, masses, tenderness, nipple changes, or nipple discharge.      ONCOLOGY TREATMENT TEAM:  1. Surgeon:  Dr. Donell Beers at [facility] 2. Medical Oncologist: Dr. Darnelle Catalan  3. Radiation Oncologist: Dr. Roselind Messier    PAST MEDICAL/SURGICAL HISTORY:  Past Medical History:  Diagnosis Date   Arthritis    left hand   History of adenomatous polyp of colon    History of radiation therapy    Right breast -08/20/20-10/04/20- Dr. Antony Blackbird   History of sciatica 2022   right side   Hx of cold sores  Hypertension    Macular degeneration of right eye    Malignant neoplasm of lower-outer quadrant of right breast of female, estrogen receptor positive (HCC) 03/2020   oncologist--- dr Dela Sweeny/ surgeon-- dr   byerly/  radiation oncology---  dr Roselind Messier;   dx   03/ 2022 DCIS, HG, ER;   04/ 2022 s/p right  lumpectomy and re-excision 06/ 2022;  completed radiation 10-04-2020   Malignant neoplasm of overlapping sites of left female breast (HCC) 04/2022   05/ 08/ 2024   left lumpectomy, scheduled for re-excision   Paroxysmal SVT (supraventricular tachycardia)    (05-28-2022  no longer has as needed medication for palpitations due to so infrequent );  ETT  in epic 07-19-2013  no ischemia,   normal echo   Presence of surgical incision 05/21/2022   left breast area   Vitamin D deficiency    Wears glasses    Past Surgical History:  Procedure Laterality Date   APPENDECTOMY  1971   BREAST BIOPSY Left 04/14/2022   MM LT BREAST BX W LOC DEV 1ST LESION IMAGE BX SPEC STEREO GUIDE 04/14/2022 [facility]   BREAST BIOPSY Left 04/14/2022   MM LT BREAST BX W LOC DEV EA AD LESION IMG BX SPEC STEREO GUIDE 04/14/2022 [facility]   BREAST BIOPSY  05/20/2022   MM LT RADIOACTIVE SEED EA ADD LESION LOC MAMMO GUIDE 05/20/2022 [facility]   BREAST BIOPSY  05/20/2022   MM LT RADIOACTIVE SEED LOC MAMMO GUIDE 05/20/2022 [facility]   BREAST BIOPSY  05/20/2022   MM LT RADIOACTIVE SEED EA ADD LESION LOC MAMMO GUIDE 05/20/2022 [facility]   BREAST ENHANCEMENT SURGERY Bilateral 2002   BREAST LUMPECTOMY WITH RADIOACTIVE SEED LOCALIZATION Right 05/02/2020   Procedure: RIGHT BREAST LUMPECTOMY WITH RADIOACTIVE SEED LOCALIZATION X 2;  Surgeon: Almond Lint, MD;  Location: [facility];  Service: General;  Laterality: Right;   BREAST LUMPECTOMY WITH RADIOACTIVE SEED LOCALIZATION Left 05/21/2022   Procedure: LEFT BREAST BRACKETED LUMPECTOMY WITH RADIOACTIVE SEED LOCALIZATION;  Surgeon: Almond Lint, MD;  Location: [facility];  Service: General;  Laterality: Left;   CATARACT EXTRACTION W/ INTRAOCULAR LENS IMPLANT Bilateral 2022   COLONOSCOPY WITH PROPOFOL  12/23/2013   dr Christella Hartigan   MASTOPEXY Bilateral 07/11/2020   Procedure: BILATERAL MASTOPEXY AND REMOVAL OF IMPLANTS WITH COMPLETE  CAPSULECTOMY;  Surgeon: Peggye Form, DO;  Location: [facility];  Service: Plastics;  Laterality: Bilateral;   RE-EXCISION OF BREAST LUMPECTOMY Right 07/11/2020   Procedure: RE-EXCISION OF RIGHT BREAST LUMPECTOMY;  Surgeon: Almond Lint, MD;  Location: [facility];  Service: General;  Laterality: Right;   RE-EXCISION OF BREAST LUMPECTOMY Left 06/03/2022   Procedure: RE-EXCISION OF LEFT BREAST LUMPECTOMY;  Surgeon: Almond Lint, MD;  Location: [facility];  Service: General;  Laterality: Left;     ALLERGIES:  No Known Allergies   CURRENT MEDICATIONS:  Outpatient Encounter Medications as of 06/18/2022  Medication Sig Note   amLODipine (NORVASC) 5 MG tablet Take 1 tablet (5 mg total) by mouth daily.    lisinopril (ZESTRIL) 20 MG tablet Take 1 tablet (20 mg total) by mouth daily.    Multiple Vitamins-Minerals (PRESERVISION AREDS 2+MULTI VIT PO) Take 1 capsule by mouth in the morning and at bedtime.    oxyCODONE (OXY IR/ROXICODONE) 5 MG immediate release tablet Take 1 tablet (5 mg total) by mouth every 6 (six) hours as needed for severe pain.    Vitamin D, Ergocalciferol, (DRISDOL) 1.25 MG (50000 UNIT) CAPS capsule Take  1 capsule (50,000 Units total) by mouth every 7 (seven) days. 05/14/2022: Saturdays    No facility-administered encounter medications on file as of 06/18/2022.     ONCOLOGIC FAMILY HISTORY:  Family History  Problem Relation Age of Onset   Depression Mother    Obesity Mother    Hypertension Father    Hyperlipidemia Father    Heart attack Father    Stroke Father    Heart disease Father    Liver cancer Sister        cholangiocarcinoma   Bladder Cancer Brother    Multiple myeloma Brother      GENETIC COUNSELING/TESTING: Not at this time   SOCIAL HISTORY:  Social History   Socioeconomic History   Marital status: Widowed    Spouse name: Not on file   Number of children: Not on file   Years of education: Not on file   Highest education level: Not on  file  Occupational History   Occupation: PRN RN   Tobacco Use   Smoking status: Former    Packs/day: 0.00    Years: 20.00    Additional pack years: 0.00    Total pack years: 0.00    Types: Cigarettes    Quit date: 10/03/2007    Years since quitting: 14.7   Smokeless tobacco: Never  Vaping Use   Vaping Use: Never used  Substance and Sexual Activity   Alcohol use: Not Currently    Comment: occasional   Drug use: Never   Sexual activity: Not Currently    Birth control/protection: Post-menopausal  Other Topics Concern   Not on file  Social History Narrative   Not on file   Social Determinants of Health   Financial Resource Strain: Low Risk  (10/31/2021)   Overall Financial Resource Strain (CARDIA)    Difficulty of Paying Living Expenses: Not hard at all  Food Insecurity: No Food Insecurity (04/30/2022)   Hunger Vital Sign    Worried About Running Out of Food in the Last Year: Never true    Ran Out of Food in the Last Year: Never true  Transportation Needs: No Transportation Needs (04/30/2022)   PRAPARE - Administrator, Civil Service (Medical): No    Lack of Transportation (Non-Medical): No  Physical Activity: Insufficiently Active (10/31/2021)   Exercise Vital Sign    Days of Exercise per Week: 4 days    Minutes of Exercise per Session: 30 min  Stress: No Stress Concern Present (10/31/2021)   [employer] of Occupational Health - Occupational Stress Questionnaire    Feeling of Stress : Not at all  Social Connections: Socially Isolated (10/31/2021)   Social Connection and Isolation Panel [NHANES]    Frequency of Communication with Friends and Family: More than three times a week    Frequency of Social Gatherings with Friends and Family: More than three times a week    Attends Religious Services: Never    Database administrator or Organizations: No    Attends Banker Meetings: Never    Marital Status: Widowed  Intimate Partner Violence: Not  At Risk (04/30/2022)   Humiliation, Afraid, Rape, and Kick questionnaire    Fear of Current or Ex-Partner: No    Emotionally Abused: No    Physically Abused: No    Sexually Abused: No     OBSERVATIONS/OBJECTIVE:  There were no vitals taken for this visit.  Physical examination deferred in lieu of counseling   LABORATORY DATA:  None for this  visit.  DIAGNOSTIC IMAGING:  None for this visit.     ASSESSMENT AND PLAN:   Ms.. Kumpf is a pleasant 72 y.o. 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  During her last visit in July have recommended that she try the anastrozole since her last visit here she had a mammogram which showed indeterminate calcifications in the left breast spanning almost 6.8 cm.  Biopsy from these calcifications showed DCIS, intermediate grade, necrosis and ER/PR 20% strong positive.  She has been taking anastrozole as prescribed.  She is now scheduled to see Dr. Donell Beers again for consideration of left breast lumpectomy.  Given bilateral breast cancer I have also recommended referral to genetics, she requested this to happen at [facility] hence referral placed to [place].  Now postlumpectomy she will consider radiation, message sent to Dr. Trenton Founds.  After radiation, we discussed about switching the antiestrogen therapy to tamoxifen.  I have reviewed the mechanism of action of tamoxifen, adverse effects including but not limited to postmenopausal symptoms such as hot flashes, vaginal discharge, arthralgias, small risk of DVT/PE, endometrial hyperplasia and endometrial carcinoma as well as benefit on bone density.  She is agreeable to proceed with tamoxifen after completing radiation.  She will return to clinic in about 6 to 8 weeks.  Thank you for consulting [place] in the care of this patient.   Please do not hesitate to contact us with any additional questions or concerns.  Kimberly Moulds MD  Total time spent: 30 min.  *Total Encounter Time as defined by the Centers for Medicare and Medicaid Services includes, in addition to the face-to-face time of a patient visit (documented in the note above) non-face-to-face time: obtaining and reviewing outside history, ordering and reviewing medications, tests or procedures, care coordination (communications with other health care professionals or caregivers) and documentation in the medical record.

## 2022-06-18 NOTE — Telephone Encounter (Signed)
Margins negative!

## 2022-06-19 ENCOUNTER — Telehealth: Payer: Self-pay | Admitting: Hematology and Oncology

## 2022-06-19 NOTE — Telephone Encounter (Signed)
Left patient a vm regarding upcoming appointment  

## 2022-06-23 ENCOUNTER — Ambulatory Visit (INDEPENDENT_AMBULATORY_CARE_PROVIDER_SITE_OTHER): Payer: PPO | Admitting: Adult Health

## 2022-06-23 ENCOUNTER — Other Ambulatory Visit (HOSPITAL_COMMUNITY): Payer: Self-pay

## 2022-06-23 ENCOUNTER — Encounter (INDEPENDENT_AMBULATORY_CARE_PROVIDER_SITE_OTHER): Payer: Self-pay | Admitting: Adult Health

## 2022-06-23 VITALS — BP 128/84 | HR 78 | Temp 97.7°F | Ht 61.0 in | Wt 177.0 lb

## 2022-06-23 DIAGNOSIS — R7301 Impaired fasting glucose: Secondary | ICD-10-CM

## 2022-06-23 DIAGNOSIS — R0602 Shortness of breath: Secondary | ICD-10-CM | POA: Diagnosis not present

## 2022-06-23 DIAGNOSIS — E559 Vitamin D deficiency, unspecified: Secondary | ICD-10-CM | POA: Diagnosis not present

## 2022-06-23 DIAGNOSIS — E538 Deficiency of other specified B group vitamins: Secondary | ICD-10-CM

## 2022-06-23 DIAGNOSIS — E669 Obesity, unspecified: Secondary | ICD-10-CM

## 2022-06-23 DIAGNOSIS — E7800 Pure hypercholesterolemia, unspecified: Secondary | ICD-10-CM

## 2022-06-23 DIAGNOSIS — I10 Essential (primary) hypertension: Secondary | ICD-10-CM

## 2022-06-23 DIAGNOSIS — Z6833 Body mass index (BMI) 33.0-33.9, adult: Secondary | ICD-10-CM

## 2022-06-23 MED ORDER — WEGOVY 0.25 MG/0.5ML ~~LOC~~ SOAJ
0.2500 mg | SUBCUTANEOUS | 0 refills | Status: DC
  Filled 2022-06-23 – 2022-06-24 (×2): qty 2, 28d supply, fill #0

## 2022-06-23 NOTE — Progress Notes (Signed)
WEIGHT SUMMARY AND BIOMETRICS  Vitals Temp: 97.7 F (36.5 C) BP: 128/84 Pulse Rate: 78 SpO2: 96 %   Anthropometric Measurements Height: 5\' 1"  (1.549 m) Weight: 177 lb (80.3 kg) BMI (Calculated): 33.46 Weight at Last Visit: 155 lb Weight Lost Since Last Visit: 0 Weight Gained Since Last Visit: 0 Starting Weight: 177 lb Total Weight Loss (lbs): 0 lb (0 kg) Peak Weight: 177 lb Waist Measurement : 44 inches   Body Composition  Body Fat %: 45.2 % Fat Mass (lbs): 80.2 lbs Muscle Mass (lbs): 92.4 lbs Total Body Water (lbs): 69.6 lbs Visceral Fat Rating : 14   Other Clinical Data Fasting: yes Labs: yes Starting Date: 06/23/22    Chief Complaint:   OBESITY Kimberly Klein is here to discuss her progress with her obesity treatment plan. She is on the keeping a food journal and adhering to recommended goals of 1100-1200 calories and 75 protein and states she is following her eating plan approximately 0 % of the time. She states she is not currently exercising.   Interim History:  Last OV at [facility] was 07/01/2021  She has since been dx'd with Ductal carcinoma in situ (DCIS) of left breast  She has undergone successful surgery, will start Radiation therapy soon.  Hunger/appetite-she endorses polyphagia r/t stress and stopping Ozempic 1mg  June 2023  Stress- She has since been dx'd with Ductal carcinoma in situ (DCIS) of left breast  She has undergone successful surgery, will start Radiation therapy soon.  Sleep- she estimates to sleep 5 hrs/night.  She has never had a Sleep Study  Exercise-she is not currently exercising- previously practiced Yoga and daily walking.  Hydration-she states "I only drink water"  Of Note- She is one of 11 siblings She has two grown children, one in [place] and the other in [place] She reports having a solid, local support system.  Subjective:   1. SOB (shortness of breath) on exertion She endorses dyspnea with extreme exertion, denies  CP  06/28/20 07:00  RMR 1522    RMR today 1541- increased by 19 calories.   Metabolism is faster than expected.  2. Impaired fasting glucose/Prediabetes Lab Results  Component Value Date   HGBA1C 5.7 (H) 05/02/2022   HGBA1C 5.6 07/04/2021   HGBA1C 5.5 12/11/2020  She was on Ozempic therapy, max dose 1mg  Last dose on/about June 2023 She denies family hx of MEN 2 or MTC She denies personal hx of pancreatitis She would like to resume GLP-1 therapy   3. Essential hypertension BP at goal at OV She is on  amLODipine (NORVASC) 5 MG tablet  lisinopril (ZESTRIL) 20 MG tablet   4. B12 deficiency  Latest Reference Range & Units 05/02/22 08:38  Vitamin B12 232 - 1,245 pg/mL 412   She has recently started oral OTC B12 supplement- she believes the strength 1000mg   5. Pure hypercholesterolemia Lipid Panel     Component Value Date/Time   CHOL 239 (H) 05/02/2022 0838   TRIG 103 05/02/2022 0838   HDL 83 05/02/2022 0838   CHOLHDL 4.1 12/11/2020 1011   LDLCALC 138 (H) 05/02/2022 0838   LABVLDL 18 05/02/2022 0838   PCP is monitoring She is not on statin therapy  6. Vitamin D deficiency  Latest Reference Range & Units 05/02/22 08:38  Vitamin D, 25-Hydroxy 30.0 - 100.0 ng/mL 37.0  She has been off Ergocalciferol >11 months She endorses persistent fatigue  Assessment/Plan:   1. SOB (shortness of breath) on exertion Check IC  2.  Impaired fasting glucose/Prediabetes Check Labs - Insulin, random  3. Essential hypertension Check Labs  4. B12 deficiency Check Labs - Vitamin B12  5. Pure hypercholesterolemia Reduce saturated fat Increase daily walking  6. Vitamin D deficiency Check Labs - VITAMIN D 25 Hydroxy (Vit-D Deficiency, Fractures)  7. Obesity (BMI 30-39.9), Starting BMI 33.6 Restart  Semaglutide-Weight Management (WEGOVY) 0.25 MG/0.5ML SOAJ Inject 0.25 mg into the skin once a week. Dispense: 2 mL, Refills: 0 of 0 remaining   Yasuko is currently in the action  stage of change. As such, her goal is to get back to weightloss efforts . She has agreed to keeping a food journal and adhering to recommended goals of 1100 calories and 75+ protein.   Exercise goals: Older adults should follow the adult guidelines. When older adults cannot meet the adult guidelines, they should be as physically active as their abilities and conditions will allow.  Older adults should do exercises that maintain or improve balance if they are at risk of falling.  Older adults should determine their level of effort for physical activity relative to their level of fitness.   Behavioral modification strategies: increasing lean protein intake, decreasing simple carbohydrates, increasing vegetables, increasing water intake, decreasing eating out, no skipping meals, meal planning and cooking strategies, planning for success, keeping a strict food journal, and decreasing junk food.  Lavina has agreed to follow-up with our clinic in 3 weeks- with Shawn Rayburn, PA-C.   She was informed of the importance of frequent follow-up visits to maximize her success with intensive lifestyle modifications for her multiple health conditions.   Ramiyah was informed we would discuss her lab results at her next visit unless there is a critical issue that needs to be addressed sooner. Scharlene agreed to keep her next visit at the agreed upon time to discuss these results.  Objective:   Blood pressure 128/84, pulse 78, temperature 97.7 F (36.5 C), height 5\' 1"  (1.549 m), weight 177 lb (80.3 kg), SpO2 96 %. Body mass index is 33.44 kg/m.  General: Cooperative, alert, well developed, in no acute distress. HEENT: Conjunctivae and lids unremarkable. Cardiovascular: Regular rhythm.  Lungs: Normal work of breathing. Neurologic: No focal deficits.   Lab Results  Component Value Date   CREATININE 0.80 06/03/2022   BUN 12 06/03/2022   NA 142 06/03/2022   K 4.2 06/03/2022   CL 105 06/03/2022   CO2 25  05/02/2022   Lab Results  Component Value Date   ALT 17 05/02/2022   AST 17 05/02/2022   ALKPHOS 92 05/02/2022   BILITOT 0.5 05/02/2022   Lab Results  Component Value Date   HGBA1C 5.7 (H) 05/02/2022   HGBA1C 5.6 07/04/2021   HGBA1C 5.5 12/11/2020   HGBA1C 5.7 (H) 06/28/2020   HGBA1C 5.3 09/27/2018   Lab Results  Component Value Date   INSULIN 9.1 06/28/2020   Lab Results  Component Value Date   TSH 0.947 05/02/2022   Lab Results  Component Value Date   CHOL 239 (H) 05/02/2022   HDL 83 05/02/2022   LDLCALC 138 (H) 05/02/2022   TRIG 103 05/02/2022   CHOLHDL 4.1 12/11/2020   Lab Results  Component Value Date   VD25OH 37.0 05/02/2022   VD25OH 38.2 07/04/2021   VD25OH 34.7 12/11/2020   Lab Results  Component Value Date   WBC 5.3 05/02/2022   HGB 15.0 06/03/2022   HCT 44.0 06/03/2022   MCV 97 05/02/2022   PLT 222 05/02/2022   Lab Results  Component Value Date   IRON 105 06/28/2020   TIBC 312 06/28/2020   FERRITIN 244 (H) 06/28/2020    Attestation Statements:   Reviewed by clinician on day of visit: allergies, medications, problem list, medical history, surgical history, family history, social history, and previous encounter notes.  I have reviewed the above documentation for accuracy and completeness, and I agree with the above. -  Marycarmen Hagey d. Kindrick Lankford, NP-C

## 2022-06-24 ENCOUNTER — Other Ambulatory Visit: Payer: Self-pay

## 2022-06-24 ENCOUNTER — Other Ambulatory Visit (INDEPENDENT_AMBULATORY_CARE_PROVIDER_SITE_OTHER): Payer: Self-pay | Admitting: Nurse Practitioner

## 2022-06-24 ENCOUNTER — Other Ambulatory Visit (HOSPITAL_COMMUNITY): Payer: Self-pay

## 2022-06-24 DIAGNOSIS — E559 Vitamin D deficiency, unspecified: Secondary | ICD-10-CM

## 2022-06-24 LAB — INSULIN, RANDOM: INSULIN: 3.4 u[IU]/mL (ref 2.6–24.9)

## 2022-06-24 LAB — VITAMIN D 25 HYDROXY (VIT D DEFICIENCY, FRACTURES): Vit D, 25-Hydroxy: 29.4 ng/mL — ABNORMAL LOW (ref 30.0–100.0)

## 2022-06-24 LAB — VITAMIN B12: Vitamin B-12: 768 pg/mL (ref 232–1245)

## 2022-06-24 MED ORDER — VITAMIN D (ERGOCALCIFEROL) 1.25 MG (50000 UNIT) PO CAPS
50000.0000 [IU] | ORAL_CAPSULE | ORAL | 0 refills | Status: DC
  Filled 2022-06-24: qty 12, 84d supply, fill #0

## 2022-06-27 ENCOUNTER — Other Ambulatory Visit (HOSPITAL_COMMUNITY): Payer: Self-pay

## 2022-06-27 ENCOUNTER — Other Ambulatory Visit: Payer: Self-pay

## 2022-07-01 ENCOUNTER — Encounter: Payer: Self-pay | Admitting: Hematology and Oncology

## 2022-07-02 NOTE — Telephone Encounter (Signed)
Per clerical, no new referral has been placed. The referral from 05/28/22 was entered incorrectly per clerical. New referral needs to be put in. Thanks

## 2022-07-07 ENCOUNTER — Other Ambulatory Visit: Payer: Self-pay | Admitting: *Deleted

## 2022-07-07 DIAGNOSIS — D0512 Intraductal carcinoma in situ of left breast: Secondary | ICD-10-CM

## 2022-07-08 NOTE — Progress Notes (Signed)
Location of Breast Cancer:  Ductal carcinoma in situ (DCIS) of left  breast  Histology per Pathology Report:  06/03/2022 A. BREAST, LEFT NEW POSTERIOR MARGIN, EXCISION:  - Negative for carcinoma in situ or invasive carcinoma   05/21/2022 A. BREAST, LEFT, LUMPECTOMY:  - Extensive intermediate to high-grade ductal carcinoma in situ with necrosis and calcifications, partially involving foci of sclerosing adenosis, and occupying a fibrotic area of about 5.3 cm  - DCIS focally involves the inked posterior margin  - No evidence of invasive carcinoma  - Biopsy site changes  - Fibrocystic change   B. BREAST, LEFT ANTERIOR MARGIN, EXCISION:  - Skin and subcutaneous tissue with mild fibrosis  - Negative for carcinoma   Receptor Status: ER(20%), PR (20%)  Past/Anticipated interventions by surgeon, if any:  07/07/2022 Dr. Almond Lint (office visit)  --Physical Examination:  Left breast: lateral incision healing well. No appreciable contour defect. No erythema or drainage.  --Assessment and Plan:  Plan is to switch to tamoxifen from anastrozole. Pt to proceed with XRT. She has appt this week.  Mammograms due March 2025 [facility]   06/03/2022 --Dr. Almond Lint Re-excisional left Breast Lumpectomy   05/21/2022 --Dr. Almond Lint Left Breast Radioactive seed bracketed lumpectomy  Past/Anticipated interventions by medical oncology, if any:  Under care of Dr. Burnice Logan Iruku 06/18/2022 --ASSESSMENT AND PLAN:  Ms.. Kimberly Klein is a pleasant 72 y.o. 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  During her last visit in July have recommended that she try the anastrozole since her last visit here she had a mammogram which showed indeterminate calcifications in the left breast spanning almost 6.8 cm.   Biopsy  from these calcifications showed DCIS, intermediate grade, necrosis and ER/PR 20% strong positive.   She has been taking anastrozole as prescribed.  She is now scheduled to see Dr. Donell Beers again for consideration of left breast lumpectomy.   Given bilateral breast cancer I have also recommended referral to genetics, she requested this to happen at [facility] hence referral placed to [place].   Now postlumpectomy she will consider radiation, message sent to Dr. Trenton Founds.   After radiation, we discussed about switching the antiestrogen therapy to tamoxifen.  I have reviewed the mechanism of action of tamoxifen, adverse effects including but not limited to postmenopausal symptoms such as hot flashes, vaginal discharge, arthralgias, small risk of DVT/PE, endometrial hyperplasia and endometrial carcinoma as well as benefit on bone density.   She is agreeable to proceed with tamoxifen after completing radiation.  She will return to clinic in about 6 to 8 weeks.  Lymphedema issues, if any:  Denies    Pain issues, if any:  Denies   SAFETY ISSUES: Prior radiation? Yes: 08/20/2020 through 10/04/2020 Site Technique Total Dose (Gy) Dose per Fx (Gy) Completed Fx Beam Energies  Breast, Right: Breast_Rt 3D 50.4/50.4 1.8 28/28 6X, 10X  Breast, Right: Breast_Rt_Bst 3D 10/10 2 5/5 6X, 10X   Pacemaker/ICD? No Possible current pregnancy? No--postmenopausal  Is the patient on methotrexate? 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

## 2022-07-09 ENCOUNTER — Ambulatory Visit
Admission: RE | Admit: 2022-07-09 | Discharge: 2022-07-09 | Disposition: A | Discharge status: Home / Self Care | Payer: PPO | Source: Ambulatory Visit | Attending: Radiation Oncology | Admitting: Radiation Oncology

## 2022-07-09 ENCOUNTER — Encounter: Payer: Self-pay | Admitting: Radiation Oncology

## 2022-07-09 ENCOUNTER — Other Ambulatory Visit: Payer: Self-pay

## 2022-07-09 VITALS — BP 131/79 | HR 86 | Temp 97.7°F | Resp 18 | Ht 61.5 in | Wt 184.4 lb

## 2022-07-09 DIAGNOSIS — Z170 Estrogen receptor positive status [ER+]: Secondary | ICD-10-CM | POA: Diagnosis not present

## 2022-07-09 DIAGNOSIS — D0512 Intraductal carcinoma in situ of left breast: Secondary | ICD-10-CM | POA: Insufficient documentation

## 2022-07-09 DIAGNOSIS — D0511 Intraductal carcinoma in situ of right breast: Secondary | ICD-10-CM | POA: Insufficient documentation

## 2022-07-09 DIAGNOSIS — Z923 Personal history of irradiation: Secondary | ICD-10-CM | POA: Diagnosis not present

## 2022-07-09 DIAGNOSIS — Z79899 Other long term (current) drug therapy: Secondary | ICD-10-CM | POA: Insufficient documentation

## 2022-07-09 NOTE — Progress Notes (Signed)
Radiation Oncology         [phone] ________________________________  Name: Kimberly Klein MRN: [id number]  Date: 07/09/2022  DOB: [birth year]  Re-Evaluation Note  CC: Dorcas Carrow, DO  Rachel Moulds, MD    ICD-10-CM   1. 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   Stage 0 (cTis (DCIS), cN0, cM0) Left Breast UOQ, Extensive intermediate to high grade DCIS, ER+ / PR+ / Her2 not assessed: s/p lumpectomy    History of Stage 0 Right Breast LOQ, DCIS, ER+ / PR- / Grade 2 or 3: diagnosed in 2022 s/p lumpectomy, XRT, and antiestrogen therapy with anastrozole   Narrative:  The patient returns today to discuss radiation treatment options. She was seen in consultation on 04/30/22.   Since consultation, she opted to proceed with a left breast lumpectomy without nodal biopsies on 05/21/22 under the care of Dr. Donell Beers. Pathology from the procedure revealed: histology of extensive intermediate to high-grade DCIS spanning approximately 5.3 cm, with necrosis and calcifications; posterior margin focally involved by DCIS; no lymph nodes were examined. ER status 20% positive and PR status 20% positive, both with strong staining intensity.    She accordingly underwent re-excision of the positive posterior margin on 06/03/22. Pathology showed no evidence of carcinoma in the final posterior margin.   To review, the patient was already on anastrozole at the time of her recent breast cancer diagnosis. Dr. Al Pimple has recommended switching her antiestrogen to tamoxifen following XRT which the patient is agreeable to proceed with.   On review of systems, the patient reports to be doing well overall since her surgeries. She denies breast pain, swelling, or and any other symptoms.   Prior  radiation therapy:   Intent: Curative  Radiation Treatment Dates: 08/20/2020 through 10/04/2020 Site Technique Total Dose (Gy) Dose per Fx (Gy) Completed Fx Beam Energies  Breast, Right: Breast_Rt 3D 50.4/50.4 1.8 28/28 6X, 10X  Breast, Right: Breast_Rt_Bst 3D 10/10 2 5/5 6X, 10X    Allergies:  has No Known Allergies.  Meds: Current Outpatient Medications  Medication Sig Dispense Refill   amLODipine (NORVASC) 5 MG tablet Take 1 tablet (5 mg total) by mouth daily. 90 tablet 1   lisinopril (ZESTRIL) 20 MG tablet Take 1 tablet (20 mg total) by mouth daily. 90 tablet 1   Multiple Vitamins-Minerals (PRESERVISION AREDS 2+MULTI VIT PO) Take 1 capsule by mouth in the morning and at bedtime.     Vitamin D, Ergocalciferol, (DRISDOL) 1.25 MG (50000 UNIT) CAPS capsule Take 1 capsule (50,000 Units total) by mouth every 7 (seven) days. 12 capsule 0   No current facility-administered medications for this encounter.    Physical Findings: The patient is in no acute distress. Patient is alert and oriented.  height is 5' 1.5" (1.562 m) and weight is 184 lb 6.4 oz (83.6 kg). Her temperature is 97.7 F (36.5 C). Her blood pressure is 131/79 and her pulse is 86. Her respiration is 18 and oxygen saturation is 97%.   Lungs are clear to auscultation bilaterally. Heart has regular rate and rhythm. No palpable cervical, supraclavicular, or axillary adenopathy. Abdomen soft, non-tender, normal bowel sounds.  Right Breast: Lumpectomy scar circumferential to nipple. No palpable mass, nipple discharge or bleeding. Left Breast: Lumpectomy incision  with no signs of infection. Skin edges are well approximated. No palpable mass, nipple discharge or bleeding.    Lab Findings: Lab Results  Component Value Date   WBC 5.3 05/02/2022   HGB 15.0 06/03/2022   HCT 44.0 06/03/2022   MCV 97 05/02/2022   PLT 222 05/02/2022    Radiographic Findings: No results found.  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   Stage 0 (cTis (DCIS), cN0, cM0) Left Breast UOQ, Intermediate grade DCIS, ER+ / PR+ / Her2 not assessed   History of Stage 0 Right Breast LOQ, DCIS, ER+ / PR- / Grade 2 or 3: diagnosed in 2022 s/p lumpectomy, XRT, and antiestrogen therapy with anastrozole   Patient is healing well from her breast surgeries. Radiation treatment is indicated at this time to prevent risk of locoregional disease recurrence. Today we discussed the natural course of early stage breast disease. We highlighted the role of radiotherapy in the management and focused on the details of logistics and delivery. We reviewed the anticipated acute and late sequelae associated with radiation in this setting. The patient was encouraged to ask questions that I answered to the best of my ability. Patient expressed readiness to proceed with treatment.  A consent form was reviewed and signed today.    Plan:  Patient is scheduled for CT simulation on 07/21/22. She has a trip planned the week prior. Will plan to begin radiation approximately one week after simulation. Plan to deliver 40.05 Gy in 15 fractions to the left breast followed by a boost of 10 Gy in 5 fractions to the lumpectomy cavity.  Will use cardiac sparing techniques if necessary.  She is a candidate for hypofractionated accelerated radiation therapy since her cosmetic implant has been removed.  -----------------------------------   Joyice Faster, PA-C   Billie Lade, PhD, MD  This document serves as a record of services personally performed by Antony Blackbird, MD. It was created on his behalf by Neena Rhymes, a trained medical scribe. The creation of this record is based on the scribe's personal observations and the provider's statements to them. This document has been checked and approved by the attending provider.

## 2022-07-10 ENCOUNTER — Other Ambulatory Visit: Payer: Self-pay

## 2022-07-21 ENCOUNTER — Other Ambulatory Visit: Payer: Self-pay

## 2022-07-21 ENCOUNTER — Encounter: Payer: Self-pay | Admitting: *Deleted

## 2022-07-21 ENCOUNTER — Ambulatory Visit
Admission: RE | Admit: 2022-07-21 | Discharge: 2022-07-21 | Disposition: A | Discharge status: Home / Self Care | Payer: PPO | Source: Ambulatory Visit | Attending: Radiation Oncology | Admitting: Radiation Oncology

## 2022-07-21 DIAGNOSIS — Z510 Encounter for antineoplastic radiation therapy: Secondary | ICD-10-CM | POA: Insufficient documentation

## 2022-07-21 DIAGNOSIS — D0512 Intraductal carcinoma in situ of left breast: Secondary | ICD-10-CM | POA: Diagnosis not present

## 2022-07-21 DIAGNOSIS — Z170 Estrogen receptor positive status [ER+]: Secondary | ICD-10-CM | POA: Diagnosis not present

## 2022-07-21 DIAGNOSIS — D0511 Intraductal carcinoma in situ of right breast: Secondary | ICD-10-CM | POA: Diagnosis not present

## 2022-07-23 ENCOUNTER — Ambulatory Visit (INDEPENDENT_AMBULATORY_CARE_PROVIDER_SITE_OTHER): Payer: PPO | Admitting: Physician Assistant

## 2022-07-23 DIAGNOSIS — D0512 Intraductal carcinoma in situ of left breast: Secondary | ICD-10-CM | POA: Diagnosis not present

## 2022-07-23 DIAGNOSIS — Z510 Encounter for antineoplastic radiation therapy: Secondary | ICD-10-CM | POA: Diagnosis not present

## 2022-07-23 DIAGNOSIS — Z170 Estrogen receptor positive status [ER+]: Secondary | ICD-10-CM | POA: Diagnosis not present

## 2022-07-23 DIAGNOSIS — D0511 Intraductal carcinoma in situ of right breast: Secondary | ICD-10-CM | POA: Diagnosis not present

## 2022-07-29 ENCOUNTER — Other Ambulatory Visit: Payer: Self-pay

## 2022-07-29 ENCOUNTER — Ambulatory Visit
Admission: RE | Admit: 2022-07-29 | Discharge: 2022-07-29 | Disposition: A | Discharge status: Home / Self Care | Payer: PPO | Source: Ambulatory Visit | Attending: Radiation Oncology | Admitting: Radiation Oncology

## 2022-07-29 DIAGNOSIS — D0512 Intraductal carcinoma in situ of left breast: Secondary | ICD-10-CM

## 2022-07-29 DIAGNOSIS — Z170 Estrogen receptor positive status [ER+]: Secondary | ICD-10-CM | POA: Diagnosis not present

## 2022-07-29 DIAGNOSIS — Z510 Encounter for antineoplastic radiation therapy: Secondary | ICD-10-CM | POA: Diagnosis not present

## 2022-07-29 DIAGNOSIS — D0511 Intraductal carcinoma in situ of right breast: Secondary | ICD-10-CM | POA: Diagnosis not present

## 2022-07-29 LAB — RAD ONC ARIA SESSION SUMMARY
Course Elapsed Days: 0
Plan Fractions Treated to Date: 1
Plan Prescribed Dose Per Fraction: 2.67 Gy
Plan Total Fractions Prescribed: 15
Plan Total Prescribed Dose: 40.05 Gy
Reference Point Dosage Given to Date: 2.67 Gy
Reference Point Session Dosage Given: 2.67 Gy
Session Number: 1

## 2022-07-29 MED ORDER — SONAFINE EX EMUL: 1.0000 | Freq: Once | CUTANEOUS | Status: DC

## 2022-07-29 MED ORDER — ALRA NON-METALLIC DEODORANT (RAD-ONC): 1.0000 | Freq: Once | TOPICAL | Status: DC

## 2022-07-30 ENCOUNTER — Ambulatory Visit
Admission: RE | Admit: 2022-07-30 | Discharge: 2022-07-30 | Disposition: A | Discharge status: Home / Self Care | Payer: PPO | Source: Ambulatory Visit | Attending: Radiation Oncology | Admitting: Radiation Oncology

## 2022-07-30 ENCOUNTER — Other Ambulatory Visit: Payer: Self-pay

## 2022-07-30 DIAGNOSIS — Z170 Estrogen receptor positive status [ER+]: Secondary | ICD-10-CM | POA: Diagnosis not present

## 2022-07-30 DIAGNOSIS — D0511 Intraductal carcinoma in situ of right breast: Secondary | ICD-10-CM | POA: Diagnosis not present

## 2022-07-30 DIAGNOSIS — D0512 Intraductal carcinoma in situ of left breast: Secondary | ICD-10-CM | POA: Diagnosis not present

## 2022-07-30 DIAGNOSIS — Z510 Encounter for antineoplastic radiation therapy: Secondary | ICD-10-CM | POA: Diagnosis not present

## 2022-07-30 LAB — RAD ONC ARIA SESSION SUMMARY
Course Elapsed Days: 1
Plan Fractions Treated to Date: 2
Plan Prescribed Dose Per Fraction: 2.67 Gy
Plan Total Fractions Prescribed: 15
Plan Total Prescribed Dose: 40.05 Gy
Reference Point Dosage Given to Date: 5.34 Gy
Reference Point Session Dosage Given: 2.67 Gy
Session Number: 2

## 2022-07-30 NOTE — Addendum Note (Signed)
Encounter addended by: Benard Halsted, LPN on: 9/51/8841 10:37 AM  Actions taken: Patient Education assessment filed

## 2022-07-31 ENCOUNTER — Other Ambulatory Visit: Payer: Self-pay

## 2022-07-31 ENCOUNTER — Ambulatory Visit
Admission: RE | Admit: 2022-07-31 | Discharge: 2022-07-31 | Disposition: A | Discharge status: Home / Self Care | Payer: PPO | Source: Ambulatory Visit | Attending: Radiation Oncology | Admitting: Radiation Oncology

## 2022-07-31 ENCOUNTER — Telehealth: Payer: Self-pay | Admitting: Adult Health

## 2022-07-31 DIAGNOSIS — Z170 Estrogen receptor positive status [ER+]: Secondary | ICD-10-CM | POA: Diagnosis not present

## 2022-07-31 DIAGNOSIS — Z510 Encounter for antineoplastic radiation therapy: Secondary | ICD-10-CM | POA: Diagnosis not present

## 2022-07-31 DIAGNOSIS — D0511 Intraductal carcinoma in situ of right breast: Secondary | ICD-10-CM | POA: Diagnosis not present

## 2022-07-31 DIAGNOSIS — D0512 Intraductal carcinoma in situ of left breast: Secondary | ICD-10-CM | POA: Diagnosis not present

## 2022-07-31 LAB — RAD ONC ARIA SESSION SUMMARY
Course Elapsed Days: 2
Plan Fractions Treated to Date: 3
Plan Prescribed Dose Per Fraction: 2.67 Gy
Plan Total Fractions Prescribed: 15
Plan Total Prescribed Dose: 40.05 Gy
Reference Point Dosage Given to Date: 8.01 Gy
Reference Point Session Dosage Given: 2.67 Gy
Session Number: 3

## 2022-08-01 ENCOUNTER — Other Ambulatory Visit: Payer: Self-pay

## 2022-08-01 ENCOUNTER — Ambulatory Visit
Admission: RE | Admit: 2022-08-01 | Discharge: 2022-08-01 | Disposition: A | Discharge status: Home / Self Care | Payer: PPO | Source: Ambulatory Visit | Attending: Radiation Oncology | Admitting: Radiation Oncology

## 2022-08-01 DIAGNOSIS — Z510 Encounter for antineoplastic radiation therapy: Secondary | ICD-10-CM | POA: Diagnosis not present

## 2022-08-01 DIAGNOSIS — Z170 Estrogen receptor positive status [ER+]: Secondary | ICD-10-CM | POA: Diagnosis not present

## 2022-08-01 DIAGNOSIS — D0511 Intraductal carcinoma in situ of right breast: Secondary | ICD-10-CM | POA: Diagnosis not present

## 2022-08-01 DIAGNOSIS — D0512 Intraductal carcinoma in situ of left breast: Secondary | ICD-10-CM | POA: Diagnosis not present

## 2022-08-01 LAB — RAD ONC ARIA SESSION SUMMARY
Course Elapsed Days: 3
Plan Fractions Treated to Date: 4
Plan Prescribed Dose Per Fraction: 2.67 Gy
Plan Total Fractions Prescribed: 15
Plan Total Prescribed Dose: 40.05 Gy
Reference Point Dosage Given to Date: 10.68 Gy
Reference Point Session Dosage Given: 2.67 Gy
Session Number: 4

## 2022-08-04 ENCOUNTER — Other Ambulatory Visit: Payer: Self-pay

## 2022-08-04 ENCOUNTER — Ambulatory Visit
Admission: RE | Admit: 2022-08-04 | Discharge: 2022-08-04 | Disposition: A | Discharge status: Home / Self Care | Payer: PPO | Source: Ambulatory Visit | Attending: Radiation Oncology | Admitting: Radiation Oncology

## 2022-08-04 DIAGNOSIS — D0512 Intraductal carcinoma in situ of left breast: Secondary | ICD-10-CM | POA: Diagnosis not present

## 2022-08-04 DIAGNOSIS — Z510 Encounter for antineoplastic radiation therapy: Secondary | ICD-10-CM | POA: Diagnosis not present

## 2022-08-04 DIAGNOSIS — D0511 Intraductal carcinoma in situ of right breast: Secondary | ICD-10-CM | POA: Diagnosis not present

## 2022-08-04 DIAGNOSIS — Z170 Estrogen receptor positive status [ER+]: Secondary | ICD-10-CM | POA: Diagnosis not present

## 2022-08-04 LAB — RAD ONC ARIA SESSION SUMMARY
Course Elapsed Days: 6
Plan Fractions Treated to Date: 5
Plan Prescribed Dose Per Fraction: 2.67 Gy
Plan Total Fractions Prescribed: 15
Plan Total Prescribed Dose: 40.05 Gy
Reference Point Dosage Given to Date: 13.35 Gy
Reference Point Session Dosage Given: 2.67 Gy
Session Number: 5

## 2022-08-05 ENCOUNTER — Ambulatory Visit
Admission: RE | Admit: 2022-08-05 | Discharge: 2022-08-05 | Disposition: A | Discharge status: Home / Self Care | Payer: PPO | Source: Ambulatory Visit | Attending: Radiation Oncology | Admitting: Radiation Oncology

## 2022-08-05 ENCOUNTER — Other Ambulatory Visit: Payer: Self-pay

## 2022-08-05 DIAGNOSIS — D0512 Intraductal carcinoma in situ of left breast: Secondary | ICD-10-CM | POA: Diagnosis not present

## 2022-08-05 DIAGNOSIS — Z170 Estrogen receptor positive status [ER+]: Secondary | ICD-10-CM | POA: Diagnosis not present

## 2022-08-05 DIAGNOSIS — Z510 Encounter for antineoplastic radiation therapy: Secondary | ICD-10-CM | POA: Diagnosis not present

## 2022-08-05 DIAGNOSIS — D0511 Intraductal carcinoma in situ of right breast: Secondary | ICD-10-CM | POA: Diagnosis not present

## 2022-08-05 LAB — RAD ONC ARIA SESSION SUMMARY
Course Elapsed Days: 7
Plan Fractions Treated to Date: 6
Plan Prescribed Dose Per Fraction: 2.67 Gy
Plan Total Fractions Prescribed: 15
Plan Total Prescribed Dose: 40.05 Gy
Reference Point Dosage Given to Date: 16.02 Gy
Reference Point Session Dosage Given: 2.67 Gy
Session Number: 6

## 2022-08-06 ENCOUNTER — Other Ambulatory Visit: Payer: Self-pay

## 2022-08-06 ENCOUNTER — Ambulatory Visit
Admission: RE | Admit: 2022-08-06 | Discharge: 2022-08-06 | Disposition: A | Discharge status: Home / Self Care | Payer: PPO | Source: Ambulatory Visit | Attending: Radiation Oncology | Admitting: Radiation Oncology

## 2022-08-06 DIAGNOSIS — D0512 Intraductal carcinoma in situ of left breast: Secondary | ICD-10-CM | POA: Diagnosis not present

## 2022-08-06 DIAGNOSIS — D0511 Intraductal carcinoma in situ of right breast: Secondary | ICD-10-CM | POA: Diagnosis not present

## 2022-08-06 DIAGNOSIS — Z510 Encounter for antineoplastic radiation therapy: Secondary | ICD-10-CM | POA: Diagnosis not present

## 2022-08-06 DIAGNOSIS — Z170 Estrogen receptor positive status [ER+]: Secondary | ICD-10-CM | POA: Diagnosis not present

## 2022-08-06 LAB — RAD ONC ARIA SESSION SUMMARY
Course Elapsed Days: 8
Plan Fractions Treated to Date: 7
Plan Prescribed Dose Per Fraction: 2.67 Gy
Plan Total Fractions Prescribed: 15
Plan Total Prescribed Dose: 40.05 Gy
Reference Point Dosage Given to Date: 18.69 Gy
Reference Point Session Dosage Given: 2.67 Gy
Session Number: 7

## 2022-08-07 ENCOUNTER — Ambulatory Visit
Admission: RE | Admit: 2022-08-07 | Discharge: 2022-08-07 | Disposition: A | Discharge status: Home / Self Care | Payer: PPO | Source: Ambulatory Visit | Attending: Radiation Oncology | Admitting: Radiation Oncology

## 2022-08-07 ENCOUNTER — Other Ambulatory Visit: Payer: Self-pay

## 2022-08-07 DIAGNOSIS — D0511 Intraductal carcinoma in situ of right breast: Secondary | ICD-10-CM | POA: Diagnosis not present

## 2022-08-07 DIAGNOSIS — Z170 Estrogen receptor positive status [ER+]: Secondary | ICD-10-CM | POA: Diagnosis not present

## 2022-08-07 DIAGNOSIS — D0512 Intraductal carcinoma in situ of left breast: Secondary | ICD-10-CM | POA: Diagnosis not present

## 2022-08-07 DIAGNOSIS — Z510 Encounter for antineoplastic radiation therapy: Secondary | ICD-10-CM | POA: Diagnosis not present

## 2022-08-07 LAB — RAD ONC ARIA SESSION SUMMARY
Course Elapsed Days: 9
Plan Fractions Treated to Date: 8
Plan Prescribed Dose Per Fraction: 2.67 Gy
Plan Total Fractions Prescribed: 15
Plan Total Prescribed Dose: 40.05 Gy
Reference Point Dosage Given to Date: 21.36 Gy
Reference Point Session Dosage Given: 2.67 Gy
Session Number: 8

## 2022-08-08 ENCOUNTER — Other Ambulatory Visit: Payer: Self-pay

## 2022-08-08 ENCOUNTER — Ambulatory Visit
Admission: RE | Admit: 2022-08-08 | Discharge: 2022-08-08 | Disposition: A | Discharge status: Home / Self Care | Payer: PPO | Source: Ambulatory Visit | Attending: Radiation Oncology | Admitting: Radiation Oncology

## 2022-08-08 DIAGNOSIS — D0511 Intraductal carcinoma in situ of right breast: Secondary | ICD-10-CM | POA: Diagnosis not present

## 2022-08-08 DIAGNOSIS — Z510 Encounter for antineoplastic radiation therapy: Secondary | ICD-10-CM | POA: Diagnosis not present

## 2022-08-08 DIAGNOSIS — Z170 Estrogen receptor positive status [ER+]: Secondary | ICD-10-CM | POA: Diagnosis not present

## 2022-08-08 DIAGNOSIS — D0512 Intraductal carcinoma in situ of left breast: Secondary | ICD-10-CM | POA: Diagnosis not present

## 2022-08-08 LAB — RAD ONC ARIA SESSION SUMMARY
Course Elapsed Days: 10
Plan Fractions Treated to Date: 9
Plan Prescribed Dose Per Fraction: 2.67 Gy
Plan Total Fractions Prescribed: 15
Plan Total Prescribed Dose: 40.05 Gy
Reference Point Dosage Given to Date: 24.03 Gy
Reference Point Session Dosage Given: 2.67 Gy
Session Number: 9

## 2022-08-11 ENCOUNTER — Other Ambulatory Visit: Payer: Self-pay

## 2022-08-11 ENCOUNTER — Ambulatory Visit
Admission: RE | Admit: 2022-08-11 | Discharge: 2022-08-11 | Disposition: A | Discharge status: Home / Self Care | Payer: PPO | Source: Ambulatory Visit | Attending: Radiation Oncology | Admitting: Radiation Oncology

## 2022-08-11 DIAGNOSIS — Z510 Encounter for antineoplastic radiation therapy: Secondary | ICD-10-CM | POA: Diagnosis not present

## 2022-08-11 DIAGNOSIS — Z170 Estrogen receptor positive status [ER+]: Secondary | ICD-10-CM | POA: Diagnosis not present

## 2022-08-11 DIAGNOSIS — D0512 Intraductal carcinoma in situ of left breast: Secondary | ICD-10-CM | POA: Diagnosis not present

## 2022-08-11 DIAGNOSIS — D0511 Intraductal carcinoma in situ of right breast: Secondary | ICD-10-CM | POA: Diagnosis not present

## 2022-08-11 LAB — RAD ONC ARIA SESSION SUMMARY
Course Elapsed Days: 13
Plan Fractions Treated to Date: 10
Plan Prescribed Dose Per Fraction: 2.67 Gy
Plan Total Fractions Prescribed: 15
Plan Total Prescribed Dose: 40.05 Gy
Reference Point Dosage Given to Date: 26.7 Gy
Reference Point Session Dosage Given: 2.67 Gy
Session Number: 10

## 2022-08-12 ENCOUNTER — Ambulatory Visit
Admission: RE | Admit: 2022-08-12 | Discharge: 2022-08-12 | Disposition: A | Discharge status: Home / Self Care | Payer: PPO | Source: Ambulatory Visit | Attending: Radiation Oncology | Admitting: Radiation Oncology

## 2022-08-12 ENCOUNTER — Ambulatory Visit: Payer: PPO | Admitting: Radiation Oncology

## 2022-08-12 ENCOUNTER — Other Ambulatory Visit: Payer: Self-pay

## 2022-08-12 DIAGNOSIS — D0512 Intraductal carcinoma in situ of left breast: Secondary | ICD-10-CM | POA: Diagnosis not present

## 2022-08-12 DIAGNOSIS — D0511 Intraductal carcinoma in situ of right breast: Secondary | ICD-10-CM | POA: Diagnosis not present

## 2022-08-12 DIAGNOSIS — Z170 Estrogen receptor positive status [ER+]: Secondary | ICD-10-CM | POA: Diagnosis not present

## 2022-08-12 DIAGNOSIS — Z510 Encounter for antineoplastic radiation therapy: Secondary | ICD-10-CM | POA: Diagnosis not present

## 2022-08-12 LAB — RAD ONC ARIA SESSION SUMMARY
Course Elapsed Days: 14
Plan Fractions Treated to Date: 11
Plan Prescribed Dose Per Fraction: 2.67 Gy
Plan Total Fractions Prescribed: 15
Plan Total Prescribed Dose: 40.05 Gy
Reference Point Dosage Given to Date: 29.37 Gy
Reference Point Session Dosage Given: 2.67 Gy
Session Number: 11

## 2022-08-13 ENCOUNTER — Other Ambulatory Visit: Payer: Self-pay

## 2022-08-13 ENCOUNTER — Ambulatory Visit
Admission: RE | Admit: 2022-08-13 | Discharge: 2022-08-13 | Disposition: A | Discharge status: Home / Self Care | Payer: PPO | Source: Ambulatory Visit | Attending: Radiation Oncology | Admitting: Radiation Oncology

## 2022-08-13 DIAGNOSIS — D0511 Intraductal carcinoma in situ of right breast: Secondary | ICD-10-CM | POA: Diagnosis not present

## 2022-08-13 DIAGNOSIS — D0512 Intraductal carcinoma in situ of left breast: Secondary | ICD-10-CM | POA: Diagnosis not present

## 2022-08-13 DIAGNOSIS — Z170 Estrogen receptor positive status [ER+]: Secondary | ICD-10-CM | POA: Diagnosis not present

## 2022-08-13 DIAGNOSIS — Z510 Encounter for antineoplastic radiation therapy: Secondary | ICD-10-CM | POA: Diagnosis not present

## 2022-08-13 LAB — RAD ONC ARIA SESSION SUMMARY
Course Elapsed Days: 15
Plan Fractions Treated to Date: 12
Plan Prescribed Dose Per Fraction: 2.67 Gy
Plan Total Fractions Prescribed: 15
Plan Total Prescribed Dose: 40.05 Gy
Reference Point Dosage Given to Date: 32.04 Gy
Reference Point Session Dosage Given: 2.67 Gy
Session Number: 12

## 2022-08-14 ENCOUNTER — Other Ambulatory Visit: Payer: Self-pay

## 2022-08-14 ENCOUNTER — Ambulatory Visit
Admission: RE | Admit: 2022-08-14 | Discharge: 2022-08-14 | Disposition: A | Discharge status: Home / Self Care | Payer: PPO | Source: Ambulatory Visit | Attending: Radiation Oncology | Admitting: Radiation Oncology

## 2022-08-14 DIAGNOSIS — Z510 Encounter for antineoplastic radiation therapy: Secondary | ICD-10-CM | POA: Insufficient documentation

## 2022-08-14 DIAGNOSIS — D0512 Intraductal carcinoma in situ of left breast: Secondary | ICD-10-CM | POA: Diagnosis not present

## 2022-08-14 LAB — RAD ONC ARIA SESSION SUMMARY
Course Elapsed Days: 16
Plan Fractions Treated to Date: 13
Plan Prescribed Dose Per Fraction: 2.67 Gy
Plan Total Fractions Prescribed: 15
Plan Total Prescribed Dose: 40.05 Gy
Reference Point Dosage Given to Date: 34.71 Gy
Reference Point Session Dosage Given: 2.67 Gy
Session Number: 13

## 2022-08-15 ENCOUNTER — Ambulatory Visit: Admission: RE | Admit: 2022-08-15 | Payer: PPO | Source: Ambulatory Visit

## 2022-08-15 ENCOUNTER — Other Ambulatory Visit: Payer: Self-pay

## 2022-08-15 DIAGNOSIS — Z170 Estrogen receptor positive status [ER+]: Secondary | ICD-10-CM | POA: Diagnosis not present

## 2022-08-15 DIAGNOSIS — Z510 Encounter for antineoplastic radiation therapy: Secondary | ICD-10-CM | POA: Diagnosis not present

## 2022-08-15 DIAGNOSIS — D0511 Intraductal carcinoma in situ of right breast: Secondary | ICD-10-CM | POA: Diagnosis not present

## 2022-08-15 DIAGNOSIS — D0512 Intraductal carcinoma in situ of left breast: Secondary | ICD-10-CM | POA: Diagnosis not present

## 2022-08-15 LAB — RAD ONC ARIA SESSION SUMMARY
Course Elapsed Days: 17
Plan Fractions Treated to Date: 14
Plan Prescribed Dose Per Fraction: 2.67 Gy
Plan Total Fractions Prescribed: 15
Plan Total Prescribed Dose: 40.05 Gy
Reference Point Dosage Given to Date: 37.38 Gy
Reference Point Session Dosage Given: 2.67 Gy
Session Number: 14

## 2022-08-18 ENCOUNTER — Other Ambulatory Visit: Payer: Self-pay

## 2022-08-18 ENCOUNTER — Ambulatory Visit
Admission: RE | Admit: 2022-08-18 | Discharge: 2022-08-18 | Disposition: A | Discharge status: Home / Self Care | Payer: PPO | Source: Ambulatory Visit | Attending: Radiation Oncology | Admitting: Radiation Oncology

## 2022-08-18 DIAGNOSIS — D0511 Intraductal carcinoma in situ of right breast: Secondary | ICD-10-CM | POA: Diagnosis not present

## 2022-08-18 DIAGNOSIS — Z510 Encounter for antineoplastic radiation therapy: Secondary | ICD-10-CM | POA: Diagnosis not present

## 2022-08-18 DIAGNOSIS — Z170 Estrogen receptor positive status [ER+]: Secondary | ICD-10-CM | POA: Diagnosis not present

## 2022-08-18 DIAGNOSIS — D0512 Intraductal carcinoma in situ of left breast: Secondary | ICD-10-CM | POA: Diagnosis not present

## 2022-08-18 LAB — RAD ONC ARIA SESSION SUMMARY
Course Elapsed Days: 20
Plan Fractions Treated to Date: 15
Plan Prescribed Dose Per Fraction: 2.67 Gy
Plan Total Fractions Prescribed: 15
Plan Total Prescribed Dose: 40.05 Gy
Reference Point Dosage Given to Date: 40.05 Gy
Reference Point Session Dosage Given: 2.67 Gy
Session Number: 15

## 2022-08-19 ENCOUNTER — Ambulatory Visit: Payer: PPO

## 2022-08-19 ENCOUNTER — Other Ambulatory Visit: Payer: Self-pay

## 2022-08-19 ENCOUNTER — Ambulatory Visit: Payer: PPO | Admitting: Adult Health

## 2022-08-19 DIAGNOSIS — Z510 Encounter for antineoplastic radiation therapy: Secondary | ICD-10-CM | POA: Diagnosis not present

## 2022-08-19 LAB — RAD ONC ARIA SESSION SUMMARY
Course Elapsed Days: 21
Plan Fractions Treated to Date: 1
Plan Prescribed Dose Per Fraction: 2 Gy
Plan Total Fractions Prescribed: 5
Plan Total Prescribed Dose: 10 Gy
Reference Point Dosage Given to Date: 2 Gy
Reference Point Session Dosage Given: 2 Gy
Session Number: 16

## 2022-08-20 ENCOUNTER — Other Ambulatory Visit: Payer: Self-pay

## 2022-08-20 ENCOUNTER — Ambulatory Visit
Admission: RE | Admit: 2022-08-20 | Discharge: 2022-08-20 | Disposition: A | Discharge status: Home / Self Care | Payer: PPO | Source: Ambulatory Visit | Attending: Radiation Oncology | Admitting: Radiation Oncology

## 2022-08-20 DIAGNOSIS — Z510 Encounter for antineoplastic radiation therapy: Secondary | ICD-10-CM | POA: Diagnosis not present

## 2022-08-20 LAB — RAD ONC ARIA SESSION SUMMARY
Course Elapsed Days: 22
Plan Fractions Treated to Date: 2
Plan Prescribed Dose Per Fraction: 2 Gy
Plan Total Fractions Prescribed: 5
Plan Total Prescribed Dose: 10 Gy
Reference Point Dosage Given to Date: 4 Gy
Reference Point Session Dosage Given: 2 Gy
Session Number: 17

## 2022-08-21 ENCOUNTER — Ambulatory Visit: Payer: PPO

## 2022-08-22 ENCOUNTER — Other Ambulatory Visit: Payer: Self-pay

## 2022-08-22 ENCOUNTER — Ambulatory Visit
Admission: RE | Admit: 2022-08-22 | Discharge: 2022-08-22 | Disposition: A | Discharge status: Home / Self Care | Payer: PPO | Source: Ambulatory Visit | Attending: Radiation Oncology | Admitting: Radiation Oncology

## 2022-08-22 DIAGNOSIS — Z510 Encounter for antineoplastic radiation therapy: Secondary | ICD-10-CM | POA: Diagnosis not present

## 2022-08-22 LAB — RAD ONC ARIA SESSION SUMMARY
Course Elapsed Days: 24
Plan Fractions Treated to Date: 3
Plan Prescribed Dose Per Fraction: 2 Gy
Plan Total Fractions Prescribed: 5
Plan Total Prescribed Dose: 10 Gy
Reference Point Dosage Given to Date: 6 Gy
Reference Point Session Dosage Given: 2 Gy
Session Number: 18

## 2022-08-25 ENCOUNTER — Ambulatory Visit
Admission: RE | Admit: 2022-08-25 | Discharge: 2022-08-25 | Disposition: A | Discharge status: Home / Self Care | Payer: PPO | Source: Ambulatory Visit | Attending: Radiation Oncology | Admitting: Radiation Oncology

## 2022-08-25 ENCOUNTER — Other Ambulatory Visit: Payer: Self-pay

## 2022-08-25 ENCOUNTER — Ambulatory Visit: Payer: PPO

## 2022-08-25 DIAGNOSIS — Z510 Encounter for antineoplastic radiation therapy: Secondary | ICD-10-CM | POA: Diagnosis not present

## 2022-08-25 LAB — RAD ONC ARIA SESSION SUMMARY
Course Elapsed Days: 27
Plan Fractions Treated to Date: 4
Plan Prescribed Dose Per Fraction: 2 Gy
Plan Total Fractions Prescribed: 5
Plan Total Prescribed Dose: 10 Gy
Reference Point Dosage Given to Date: 8 Gy
Reference Point Session Dosage Given: 2 Gy
Session Number: 19

## 2022-08-26 ENCOUNTER — Ambulatory Visit: Payer: PPO

## 2022-08-26 ENCOUNTER — Other Ambulatory Visit: Payer: Self-pay

## 2022-08-26 ENCOUNTER — Ambulatory Visit
Admission: RE | Admit: 2022-08-26 | Discharge: 2022-08-26 | Disposition: A | Discharge status: Home / Self Care | Payer: PPO | Source: Ambulatory Visit | Attending: Radiation Oncology | Admitting: Radiation Oncology

## 2022-08-26 DIAGNOSIS — Z170 Estrogen receptor positive status [ER+]: Secondary | ICD-10-CM | POA: Diagnosis not present

## 2022-08-26 DIAGNOSIS — D0511 Intraductal carcinoma in situ of right breast: Secondary | ICD-10-CM | POA: Diagnosis not present

## 2022-08-26 DIAGNOSIS — D0512 Intraductal carcinoma in situ of left breast: Secondary | ICD-10-CM | POA: Diagnosis not present

## 2022-08-26 DIAGNOSIS — Z510 Encounter for antineoplastic radiation therapy: Secondary | ICD-10-CM | POA: Diagnosis not present

## 2022-08-26 LAB — RAD ONC ARIA SESSION SUMMARY
Course Elapsed Days: 28
Plan Fractions Treated to Date: 5
Plan Prescribed Dose Per Fraction: 2 Gy
Plan Total Fractions Prescribed: 5
Plan Total Prescribed Dose: 10 Gy
Reference Point Dosage Given to Date: 10 Gy
Reference Point Session Dosage Given: 2 Gy
Session Number: 20

## 2022-08-27 NOTE — Radiation Completion Notes (Signed)
Patient Name: JIAQI, RAHN MRN: [id number] Date of Birth: [birth year] Referring Physician: Rachel Moulds, M.D. Date of Service: 2022-08-27 Radiation Oncologist: Arnette Schaumann, M.D. [facility] [place]                             RADIATION ONCOLOGY END OF TREATMENT NOTE     Diagnosis: D05.12 Intraductal carcinoma in situ of left breast Staging on 2021-01-18: Ductal carcinoma in situ (DCIS) of right breast T=pTis (DCIS), N=pN0, M=cM0 Staging on 2020-04-04: Ductal carcinoma in situ (DCIS) of right breast T=cTis (DCIS), N=cN0, M=cM0 Staging on 2022-06-18: Ductal carcinoma in situ (DCIS) of left breast T=cTis (DCIS), N=cN0, M=cM0 Intent: Curative     ==========DELIVERED PLANS==========  First Treatment Date: 2022-07-29 - Last Treatment Date: 2022-08-26   Plan Name: Breast_L_BH Site: Breast, Left Technique: 3D Mode: Photon Dose Per Fraction: 2.67 Gy Prescribed Dose (Delivered / Prescribed): 40.05 Gy / 40.05 Gy Prescribed Fxs (Delivered / Prescribed): 15 / 15   Plan Name: Brst_L_Bst_BH Site: Breast, Left Technique: 3D Mode: Photon Dose Per Fraction: 2 Gy Prescribed Dose (Delivered / Prescribed): 10 Gy / 10 Gy Prescribed Fxs (Delivered / Prescribed): 5 / 5     ==========ON TREATMENT VISIT DATES========== 2022-07-29, 2022-08-05, 2022-08-12, 2022-08-19     ==========UPCOMING VISITS==========       ==========APPENDIX - ON TREATMENT VISIT NOTES==========   See weekly On Treatment Notes in Epic for details.

## 2022-08-28 ENCOUNTER — Ambulatory Visit (INDEPENDENT_AMBULATORY_CARE_PROVIDER_SITE_OTHER): Payer: PPO | Admitting: Physician Assistant

## 2022-08-29 ENCOUNTER — Inpatient Hospital Stay: Payer: PPO | Attending: Hematology and Oncology | Admitting: Adult Health

## 2022-09-02 ENCOUNTER — Encounter: Payer: Self-pay | Admitting: *Deleted

## 2022-09-23 ENCOUNTER — Encounter: Payer: Self-pay | Admitting: Radiation Oncology

## 2022-09-25 ENCOUNTER — Ambulatory Visit
Admission: RE | Admit: 2022-09-25 | Discharge: 2022-09-25 | Disposition: A | Discharge status: Home / Self Care | Payer: PPO | Source: Ambulatory Visit | Attending: Radiation Oncology | Admitting: Radiation Oncology

## 2022-09-26 ENCOUNTER — Telehealth: Payer: Self-pay | Admitting: Radiation Oncology

## 2022-09-26 NOTE — Telephone Encounter (Signed)
9/13 @ 8:30 am Called and spoke to patient about being reschedule for her missed FU15 appointment on yesterday.  Patient stated she forgot and also has Covid and requested her follow up with Dr. Roselind Messier two weeks out.  Email sent to Dr. Roselind Messier and copied [facility].

## 2022-10-12 NOTE — Progress Notes (Signed)
Radiation Oncology         [phone] ________________________________  Name: Kimberly Klein MRN: [id number]  Date: 10/13/2022  DOB: 1950/02/01  Follow-Up Visit Note  CC: Dorcas Carrow, DO  Rachel Moulds, MD  No diagnosis found.  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   Stage 0 (cTis (DCIS), cN0, cM0) Left Breast UOQ, Extensive intermediate to high grade DCIS, ER+ / PR+ / Her2 not assessed: s/p lumpectomy    History of Stage 0 Right Breast LOQ, DCIS, ER+ / PR- / Grade 2 or 3: diagnosed in 2022 s/p lumpectomy, XRT, and antiestrogen therapy with anastrozole      Interval Since Last Radiation: 1 month and 17 days   Indication for treatment: Curative       Radiation treatment dates: 07/29/22 through 08/26/22  Site/dose:    1) Left breast - 40.05 Gy delivered in 15 Fx at 2.67 Gy/Fx 2) Left breast boost - 10 Gy delivered in 5 Fx at 2 Gy/Fx Technique/Mode: 3D / Photon Beams/energy: 6X-FFF, 10X  Narrative:  The patient returns today for routine follow-up. The patient tolerated radiation treatment relatively well.  During her final weekly treatment check on 08/19/22, the patient endorsed some redness to the treatment area. Physical exam performed on that same date showed diffuse erythema to the left breast area without any skin breakdown. Minimal radiation dermatitis was also appreciated.     Based on her discussion with Dr. Al Pimple prior to initiating radiation therapy, she is agreeable to transition to antiestrogen therapy consisting of tamoxifen (previously on anastrozole).       No other significant interval history since the patient completed radiation therapy.   ***                       Allergies:  has No Known Allergies.  Meds: Current Outpatient Medications  Medication Sig Dispense Refill   amLODipine (NORVASC) 5 MG  tablet Take 1 tablet (5 mg total) by mouth daily. 90 tablet 1   COVID-19 mRNA bivalent vaccine, Pfizer, (PFIZER COVID-19 VAC BIVALENT) injection Inject into the muscle. 0.3 mL 0   COVID-19 mRNA Vac-TriS, Pfizer, (PFIZER-BIONT COVID-19 VAC-TRIS) SUSP injection Inject into the muscle. 0.3 mL 0   lisinopril (ZESTRIL) 20 MG tablet Take 1 tablet (20 mg total) by mouth daily. 90 tablet 1   Multiple Vitamins-Minerals (PRESERVISION AREDS 2+MULTI VIT PO) Take 1 capsule by mouth in the morning and at bedtime.     Vitamin D, Ergocalciferol, (DRISDOL) 1.25 MG (50000 UNIT) CAPS capsule Take 1 capsule (50,000 Units total) by mouth every 7 (seven) days. 12 capsule 0   No current facility-administered medications for this encounter.    Physical Findings: The patient is in no acute distress. Patient is alert and oriented.  vitals were not taken for this visit. .  No significant changes. Lungs are clear to auscultation bilaterally. Heart has regular rate and rhythm. No palpable cervical, supraclavicular, or axillary adenopathy. Abdomen soft, non-tender, normal bowel sounds.  Right Breast: *** no palpable mass, nipple discharge or bleeding. Left Breast: *** no palpable mass, nipple discharge or bleeding.   Lab Findings: Lab Results  Component Value Date   WBC 5.3 05/02/2022   HGB 15.0 06/03/2022   HCT 44.0 06/03/2022   MCV 97 05/02/2022   PLT 222 05/02/2022  Radiographic Findings: No results found.  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   Stage 0 (cTis (DCIS), cN0, cM0) Left Breast UOQ, Extensive intermediate to high grade DCIS, ER+ / PR+ / Her2 not assessed: s/p lumpectomy    History of Stage 0 Right Breast LOQ, DCIS, ER+ / PR- / Grade 2 or 3: diagnosed in 2022 s/p lumpectomy, XRT, and antiestrogen therapy with anastrozole      The patient  is recovering from the effects of radiation.  ***  Plan:  ***   *** minutes of total time was spent for this patient encounter, including preparation, face-to-face counseling with the patient and coordination of care, physical exam, and documentation of the encounter. ____________________________________  Billie Lade, PhD, MD  This document serves as a record of services personally performed by Antony Blackbird, MD. It was created on his behalf by Neena Rhymes, a trained medical scribe. The creation of this record is based on the scribe's personal observations and the provider's statements to them. This document has been checked and approved by the attending provider.

## 2022-10-12 NOTE — Progress Notes (Signed)
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   Stage 0 (cTis (DCIS), cN0, cM0) Left Breast UOQ, Extensive intermediate to high grade DCIS, ER+ / PR+ / Her2 not assessed: s/p lumpectomy    History of Stage 0 Right Breast LOQ, DCIS, ER+ / PR- / Grade 2 or 3: diagnosed in 2022 s/p lumpectomy, XRT, and antiestrogen therapy with anastrozole      Indication for treatment: Curative        Radiation treatment dates: 07/29/22 through 08/26/22   Site/dose:    1) Left breast - 40.05 Gy delivered in 15 Fx at 2.67 Gy/Fx 2) Left breast boost - 10 Gy delivered in 5 Fx at 2 Gy/Fx  Technique/Mode: 3D / Photon   Beams/energy: 6X-FFF, 10X  Narrative: The patient tolerated radiation treatment relatively well.  During her final weekly treatment check on 08/19/22, the patient endorsed some redness to the treatment area. Physical exam performed on that same date showed diffuse erythema to the left breast area without any skin breakdown. Minimal radiation dermatitis was also appreciated.   Plan: The patient has completed radiation treatment. The patient will return to radiation oncology clinic for routine followup in one month. I advised them to call or return sooner if they have any questions or concerns related to their recovery or treatment.  -----------------------------------  Billie Lade, PhD, MD  This document serves as a record of services personally performed by Antony Blackbird, MD. It was created on his behalf by Neena Rhymes, a trained medical scribe. The creation of this record is based on the scribe's personal observations and the provider's statements to them. This document  has been checked and approved by the attending provider.

## 2022-10-13 ENCOUNTER — Other Ambulatory Visit: Payer: Self-pay

## 2022-10-13 ENCOUNTER — Ambulatory Visit
Admission: RE | Admit: 2022-10-13 | Discharge: 2022-10-13 | Disposition: A | Discharge status: Home / Self Care | Payer: PPO | Source: Ambulatory Visit | Attending: Radiation Oncology | Admitting: Radiation Oncology

## 2022-10-13 VITALS — BP 152/79 | HR 82 | Temp 97.3°F | Resp 18 | Ht 61.5 in | Wt 184.2 lb

## 2022-10-13 DIAGNOSIS — Z923 Personal history of irradiation: Secondary | ICD-10-CM | POA: Insufficient documentation

## 2022-10-13 DIAGNOSIS — Z170 Estrogen receptor positive status [ER+]: Secondary | ICD-10-CM | POA: Diagnosis not present

## 2022-10-13 DIAGNOSIS — Z79811 Long term (current) use of aromatase inhibitors: Secondary | ICD-10-CM | POA: Insufficient documentation

## 2022-10-13 DIAGNOSIS — D0512 Intraductal carcinoma in situ of left breast: Secondary | ICD-10-CM | POA: Diagnosis not present

## 2022-10-19 ENCOUNTER — Encounter: Payer: Self-pay | Admitting: Pharmacist

## 2022-10-19 NOTE — Progress Notes (Signed)
Pharmacy Quality Measure Review  This patient is appearing on a report for being at risk of failing the adherence measure for hypertension (ACEi/ARB) medications this calendar year.   Medication: lisinopril 20 mg Last fill date: 5/23 for 90 day supply  Attempted to outreach patient to discuss adherence to lisinopril. Unable to reach her so will send my chart message.  Jarrett Ables, PharmD PGY-1 Pharmacy Resident

## 2022-10-23 ENCOUNTER — Other Ambulatory Visit (HOSPITAL_COMMUNITY): Payer: Self-pay

## 2022-10-23 DIAGNOSIS — M79672 Pain in left foot: Secondary | ICD-10-CM | POA: Diagnosis not present

## 2022-10-23 MED ORDER — PREDNISONE 10 MG (21) PO TBPK
ORAL_TABLET | ORAL | 0 refills | Status: DC
  Filled 2022-10-23: qty 21, 6d supply, fill #0

## 2022-10-24 ENCOUNTER — Other Ambulatory Visit: Payer: Self-pay

## 2022-10-24 ENCOUNTER — Other Ambulatory Visit (HOSPITAL_COMMUNITY): Payer: Self-pay

## 2022-11-03 ENCOUNTER — Ambulatory Visit: Payer: PPO | Admitting: Family Medicine

## 2022-11-05 ENCOUNTER — Encounter (INDEPENDENT_AMBULATORY_CARE_PROVIDER_SITE_OTHER): Payer: PPO | Admitting: Ophthalmology

## 2022-11-14 ENCOUNTER — Ambulatory Visit: Payer: PPO | Admitting: Family Medicine

## 2022-11-28 ENCOUNTER — Telehealth: Payer: Self-pay | Admitting: Family Medicine

## 2022-11-28 NOTE — Telephone Encounter (Signed)
Copied from CRM [id number]. Topic: Medicare AWV >> Nov 28, 2022  2:20 PM Payton Doughty wrote: Reason for CRM: Called LVM 11/28/2022 to schedule Annual Wellness Visit  Verlee Rossetti; Care Guide Ambulatory Clinical Support [place] [facility] Direct Dial: [phone]

## 2022-12-05 ENCOUNTER — Encounter (INDEPENDENT_AMBULATORY_CARE_PROVIDER_SITE_OTHER): Payer: PPO | Admitting: Ophthalmology

## 2022-12-05 DIAGNOSIS — H353132 Nonexudative age-related macular degeneration, bilateral, intermediate dry stage: Secondary | ICD-10-CM | POA: Diagnosis not present

## 2022-12-05 DIAGNOSIS — H43813 Vitreous degeneration, bilateral: Secondary | ICD-10-CM | POA: Diagnosis not present

## 2022-12-08 DIAGNOSIS — C44622 Squamous cell carcinoma of skin of right upper limb, including shoulder: Secondary | ICD-10-CM | POA: Diagnosis not present

## 2022-12-08 DIAGNOSIS — D485 Neoplasm of uncertain behavior of skin: Secondary | ICD-10-CM | POA: Diagnosis not present

## 2022-12-08 DIAGNOSIS — L570 Actinic keratosis: Secondary | ICD-10-CM | POA: Diagnosis not present

## 2023-01-20 DIAGNOSIS — C44622 Squamous cell carcinoma of skin of right upper limb, including shoulder: Secondary | ICD-10-CM | POA: Diagnosis not present

## 2023-01-20 DIAGNOSIS — L905 Scar conditions and fibrosis of skin: Secondary | ICD-10-CM | POA: Diagnosis not present

## 2023-02-20 ENCOUNTER — Other Ambulatory Visit (HOSPITAL_COMMUNITY): Payer: Self-pay

## 2023-02-20 ENCOUNTER — Ambulatory Visit: Payer: PPO | Admitting: Family Medicine

## 2023-02-20 VITALS — BP 117/79 | HR 82 | Ht 61.25 in | Wt 179.6 lb

## 2023-02-20 DIAGNOSIS — E538 Deficiency of other specified B group vitamins: Secondary | ICD-10-CM

## 2023-02-20 DIAGNOSIS — E7800 Pure hypercholesterolemia, unspecified: Secondary | ICD-10-CM | POA: Diagnosis not present

## 2023-02-20 DIAGNOSIS — Z0000 Encounter for general adult medical examination without abnormal findings: Secondary | ICD-10-CM

## 2023-02-20 DIAGNOSIS — Z1231 Encounter for screening mammogram for malignant neoplasm of breast: Secondary | ICD-10-CM | POA: Diagnosis not present

## 2023-02-20 DIAGNOSIS — R7301 Impaired fasting glucose: Secondary | ICD-10-CM | POA: Diagnosis not present

## 2023-02-20 DIAGNOSIS — D0512 Intraductal carcinoma in situ of left breast: Secondary | ICD-10-CM

## 2023-02-20 DIAGNOSIS — I10 Essential (primary) hypertension: Secondary | ICD-10-CM

## 2023-02-20 DIAGNOSIS — E559 Vitamin D deficiency, unspecified: Secondary | ICD-10-CM

## 2023-02-20 LAB — BAYER DCA HB A1C WAIVED: HB A1C (BAYER DCA - WAIVED): 5.5 % (ref 4.8–5.6)

## 2023-02-20 LAB — MICROALBUMIN, URINE WAIVED
Creatinine, Urine Waived: 50 mg/dL (ref 10–300)
Microalb, Ur Waived: 30 mg/L — ABNORMAL HIGH (ref 0–19)

## 2023-02-20 MED ORDER — LISINOPRIL 20 MG PO TABS
20.0000 mg | ORAL_TABLET | Freq: Every day | ORAL | 1 refills | Status: DC
  Filled 2023-02-20 – 2023-04-03 (×2): qty 90, 90d supply, fill #0
  Filled 2023-08-20 – 2023-09-11 (×2): qty 90, 90d supply, fill #1

## 2023-02-20 MED ORDER — AMLODIPINE BESYLATE 5 MG PO TABS
5.0000 mg | ORAL_TABLET | Freq: Every day | ORAL | 1 refills | Status: DC
  Filled 2023-02-20 – 2023-04-03 (×2): qty 90, 90d supply, fill #0
  Filled 2023-08-20 – 2023-09-11 (×2): qty 90, 90d supply, fill #1

## 2023-02-20 NOTE — Assessment & Plan Note (Signed)
 Under good control on current regimen. Continue current regimen. Continue to monitor. Call with any concerns. Refills given. Labs drawn today.

## 2023-02-20 NOTE — Assessment & Plan Note (Signed)
Doing great with A1c of 5.5. Continue diet and exercise. Call with any concerns.

## 2023-02-20 NOTE — Assessment & Plan Note (Signed)
Rechecking lab today. Await results. Treat as needed.

## 2023-02-20 NOTE — Assessment & Plan Note (Signed)
 Due for mammo in May. Ordered today. Call with any concerns.

## 2023-02-20 NOTE — Progress Notes (Signed)
 BP 117/79   Pulse 82   Ht 5' 1.25 (1.556 m)   Wt 179 lb 9.6 oz (81.5 kg)   SpO2 99%   BMI 33.66 kg/m    Subjective:    Patient ID: Kimberly Klein, female    DOB: 12-18-1950, 73 y.o.   MRN: [id number]  HPI: Kimberly Klein is a 73 y.o. female presenting on 02/20/2023 for comprehensive medical examination. Current medical complaints include:  HYPERTENSION / HYPERLIPIDEMIA Satisfied with current treatment? yes Duration of hypertension: chronic BP monitoring frequency: not checking BP medication side effects: no Past BP meds: lisinopril , amlodipine  Duration of hyperlipidemia: chronic Cholesterol medication side effects: N/A Cholesterol supplements: none Past cholesterol medications: none Medication compliance: excellent compliance Aspirin: no Recent stressors: no Recurrent headaches: no Visual changes: no Palpitations: no Dyspnea: yes Chest pain: no Lower extremity edema: no Dizzy/lightheaded: no  Impaired Fasting Glucose HbA1C:  Lab Results  Component Value Date   HGBA1C 5.7 (H) 05/02/2022   Duration of elevated blood sugar: chronic Polydipsia: no Polyuria: no Weight change: no Visual disturbance: no Glucose Monitoring: no Diabetic Education: Completed  Menopausal Symptoms: no  Functional Status Survey: Is the patient deaf or have difficulty hearing?: No Does the patient have difficulty seeing, even when wearing glasses/contacts?: No Does the patient have difficulty concentrating, remembering, or making decisions?: No Does the patient have difficulty walking or climbing stairs?: No Does the patient have difficulty dressing or bathing?: No Does the patient have difficulty doing errands alone such as visiting a doctor's office or shopping?: No     02/20/2023    3:04 PM 05/02/2022    8:15 AM 10/31/2021   12:08 PM 07/04/2021    4:22 PM 12/31/2020    9:58 AM  Fall Risk   Falls in the past year? 0 1 0 0 0  Number falls in past yr: 0 0 0 0 0  Injury with  Fall?  0 0 0 0  Risk for fall due to : No Fall Risks No Fall Risks  No Fall Risks No Fall Risks  Follow up  Falls evaluation completed Falls evaluation completed;Education provided;Falls prevention discussed Falls evaluation completed Falls evaluation completed    Depression Screen    02/20/2023    3:04 PM 05/02/2022    8:17 AM 04/30/2022    3:38 PM 10/31/2021   12:10 PM 07/04/2021    4:22 PM  Depression screen PHQ 2/9  Decreased Interest 0 0 0 0 0  Down, Depressed, Hopeless 0 0 0 0 0  PHQ - 2 Score 0 0 0 0 0  Altered sleeping  1  3 0  Tired, decreased energy  1  0 1  Change in appetite  1  0 0  Feeling bad or failure about yourself   0  0 0  Trouble concentrating  0  0 0  Moving slowly or fidgety/restless  0  0 0  Suicidal thoughts  0  0 0  PHQ-9 Score  3  3 1   Difficult doing work/chores  Somewhat difficult  Not difficult at all Not difficult at all    Advanced Directives Does patient have a HCPOA?    yes If yes, name and contact information:  Does patient have a living will or MOST form?  yes  Past Medical History:  Past Medical History:  Diagnosis Date   Arthritis    left hand   History of adenomatous polyp of colon    History of radiation therapy  Right breast -08/20/20-10/04/20- Dr. Lynwood Nasuti   History of radiation therapy    Left breast- 07/29/22-08/26/22- Dr. 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, HG, ER;   04/ 2022 s/p right lumpectomy and re-excision 06/ 2022;  completed radiation 10-04-2020   Malignant neoplasm of overlapping sites of left female breast (HCC) 04/2022   05/ 08/ 2024   left lumpectomy, scheduled for re-excision   Paroxysmal SVT  (supraventricular tachycardia) (HCC)    (05-28-2022  no longer has as needed medication for palpitations due to so infrequent );  ETT  in epic 07-19-2013  no ischemia,   normal echo   Presence of surgical incision 05/21/2022   left breast area   Vitamin D  deficiency    Wears glasses     Surgical History:  Past Surgical History:  Procedure Laterality Date   APPENDECTOMY  1971   BREAST BIOPSY Left 04/14/2022   MM LT BREAST BX W LOC DEV 1ST LESION IMAGE BX SPEC STEREO GUIDE 04/14/2022 [facility]   BREAST BIOPSY Left 04/14/2022   MM LT BREAST BX W LOC DEV EA AD LESION IMG BX SPEC STEREO GUIDE 04/14/2022 [facility]   BREAST BIOPSY  05/20/2022   MM LT RADIOACTIVE SEED EA ADD LESION LOC MAMMO GUIDE 05/20/2022 [facility]   BREAST BIOPSY  05/20/2022   MM LT RADIOACTIVE SEED LOC MAMMO GUIDE 05/20/2022 [facility]   BREAST BIOPSY  05/20/2022   MM LT RADIOACTIVE SEED EA ADD LESION LOC MAMMO GUIDE 05/20/2022 [facility]   BREAST ENHANCEMENT SURGERY Bilateral 2002   BREAST LUMPECTOMY WITH RADIOACTIVE SEED LOCALIZATION Right 05/02/2020   Procedure: RIGHT BREAST LUMPECTOMY WITH RADIOACTIVE SEED LOCALIZATION X 2;  Surgeon: Aron Shoulders, MD;  Location: [facility];  Service: General;  Laterality: Right;   BREAST LUMPECTOMY WITH RADIOACTIVE SEED LOCALIZATION Left 05/21/2022   Procedure: LEFT BREAST BRACKETED LUMPECTOMY WITH RADIOACTIVE SEED LOCALIZATION;  Surgeon: Aron Shoulders, MD;  Location: [facility];  Service: General;  Laterality: Left;   CATARACT EXTRACTION W/ INTRAOCULAR LENS IMPLANT Bilateral 2022   COLONOSCOPY WITH PROPOFOL   12/23/2013   dr teressa   MASTOPEXY Bilateral 07/11/2020   Procedure: BILATERAL MASTOPEXY AND REMOVAL OF IMPLANTS WITH COMPLETE CAPSULECTOMY;  Surgeon: Lowery Estefana RAMAN, DO;  Location: [facility];  Service: Plastics;  Laterality: Bilateral;   RE-EXCISION OF BREAST LUMPECTOMY Right 07/11/2020   Procedure: RE-EXCISION OF RIGHT BREAST  LUMPECTOMY;  Surgeon: Aron Shoulders, MD;  Location: [facility];  Service: General;  Laterality: Right;   RE-EXCISION OF BREAST LUMPECTOMY Left 06/03/2022   Procedure: RE-EXCISION OF LEFT BREAST LUMPECTOMY;  Surgeon: Aron Shoulders, MD;  Location: [facility];  Service: General;  Laterality: Left;    Medications:  Current Outpatient Medications on File Prior to Visit  Medication Sig   Multiple Vitamins-Minerals (PRESERVISION AREDS 2+MULTI VIT PO) Take 1 capsule by mouth in the morning and at bedtime.   No current facility-administered medications on file prior to visit.    Allergies:  No Known Allergies  Social History:  Social History   Socioeconomic History   Marital status: Widowed    Spouse name:  Not on file   Number of children: Not on file   Years of education: Not on file   Highest education level: Bachelor's degree (e.g., BA, AB, BS)  Occupational History   Occupation: PRN RN   Tobacco Use   Smoking status: Former    Current packs/day: 0.00    Types: Cigarettes    Quit date: 10/03/1987    Years since quitting: 35.4   Smokeless tobacco: Never  Vaping Use   Vaping status: Never Used  Substance and Sexual Activity   Alcohol use: Not Currently    Comment: occasional   Drug use: Never   Sexual activity: Not Currently    Birth control/protection: Post-menopausal  Other Topics Concern   Not on file  Social History Narrative   Not on file   Social Drivers of Health   Financial Resource Strain: Low Risk  (02/20/2023)   Overall Financial Resource Strain (CARDIA)    Difficulty of Paying Living Expenses: Not hard at all  Food Insecurity: No Food Insecurity (02/20/2023)   Hunger Vital Sign    Worried About Running Out of Food in the Last Year: Never true    Ran Out of Food in the Last Year: Never true  Transportation Needs: No Transportation Needs (02/20/2023)   PRAPARE - Administrator, Civil Service (Medical): No    Lack of Transportation  (Non-Medical): No  Physical Activity: Insufficiently Active (02/20/2023)   Exercise Vital Sign    Days of Exercise per Week: 2 days    Minutes of Exercise per Session: 30 min  Stress: No Stress Concern Present (02/20/2023)   [employer] of Occupational Health - Occupational Stress Questionnaire    Feeling of Stress : Only a little  Social Connections: Moderately Isolated (02/20/2023)   Social Connection and Isolation Panel [NHANES]    Frequency of Communication with Friends and Family: More than three times a week    Frequency of Social Gatherings with Friends and Family: Twice a week    Attends Religious Services: Never    Database Administrator or Organizations: Yes    Attends Banker Meetings: 1 to 4 times per year    Marital Status: Widowed  Intimate Partner Violence: Not At Risk (04/30/2022)   Humiliation, Afraid, Rape, and Kick questionnaire    Fear of Current or Ex-Partner: No    Emotionally Abused: No    Physically Abused: No    Sexually Abused: No   Social History   Tobacco Use  Smoking Status Former   Current packs/day: 0.00   Types: Cigarettes   Quit date: 10/03/1987   Years since quitting: 35.4  Smokeless Tobacco Never   Social History   Substance and Sexual Activity  Alcohol Use Not Currently   Comment: occasional    Family History:  Family History  Problem Relation Age of Onset   Depression Mother    Obesity Mother    Hypertension Father    Hyperlipidemia Father    Heart attack Father    Stroke Father    Heart disease Father    Liver cancer Sister        cholangiocarcinoma   Bladder Cancer Brother    Multiple myeloma Brother     Past medical history, surgical history, medications, allergies, family history and social history reviewed with patient today and changes made to appropriate areas of the chart.   Review of Systems  Constitutional: Negative.   HENT: Negative.    Eyes:  Positive for  blurred vision. Negative for double  vision, photophobia, pain, discharge and redness.  Respiratory:  Positive for shortness of breath (improving with the start of exercise). Negative for cough, hemoptysis, sputum production and wheezing.   Cardiovascular: Negative.   Gastrointestinal: Negative.   Genitourinary: Negative.   Musculoskeletal: Negative.   Skin: Negative.   Neurological: Negative.   Endo/Heme/Allergies: Negative.   Psychiatric/Behavioral: Negative.      All other ROS negative except what is listed above and in the HPI.      Objective:    BP 117/79   Pulse 82   Ht 5' 1.25 (1.556 m)   Wt 179 lb 9.6 oz (81.5 kg)   SpO2 99%   BMI 33.66 kg/m   Wt Readings from Last 3 Encounters:  02/20/23 179 lb 9.6 oz (81.5 kg)  10/13/22 184 lb 3.2 oz (83.6 kg)  07/09/22 184 lb 6.4 oz (83.6 kg)     Physical Exam Vitals and nursing note reviewed.  Constitutional:      General: She is not in acute distress.    Appearance: Normal appearance. She is not ill-appearing, toxic-appearing or diaphoretic.  HENT:     Head: Normocephalic and atraumatic.     Right Ear: Tympanic membrane, ear canal and external ear normal. There is no impacted cerumen.     Left Ear: Tympanic membrane, ear canal and external ear normal. There is no impacted cerumen.     Nose: Nose normal. No congestion or rhinorrhea.     Mouth/Throat:     Mouth: Mucous membranes are moist.     Pharynx: Oropharynx is clear. No oropharyngeal exudate or posterior oropharyngeal erythema.  Eyes:     General: No scleral icterus.       Right eye: No discharge.        Left eye: No discharge.     Extraocular Movements: Extraocular movements intact.     Conjunctiva/sclera: Conjunctivae normal.     Pupils: Pupils are equal, round, and reactive to light.  Neck:     Vascular: No carotid bruit.  Cardiovascular:     Rate and Rhythm: Normal rate and regular rhythm.     Pulses: Normal pulses.     Heart sounds: No murmur heard.    No friction rub. No gallop.   Pulmonary:     Effort: Pulmonary effort is normal. No respiratory distress.     Breath sounds: Normal breath sounds. No stridor. No wheezing, rhonchi or rales.  Chest:     Chest wall: No tenderness.  Abdominal:     General: Abdomen is flat. Bowel sounds are normal. There is no distension.     Palpations: Abdomen is soft. There is no mass.     Tenderness: There is no abdominal tenderness. There is no right CVA tenderness, left CVA tenderness, guarding or rebound.     Hernia: No hernia is present.  Genitourinary:    Comments: Breast and pelvic exams deferred with shared decision making Musculoskeletal:        General: No swelling, tenderness, deformity or signs of injury.     Cervical back: Normal range of motion and neck supple. No rigidity. No muscular tenderness.     Right lower leg: No edema.     Left lower leg: No edema.  Lymphadenopathy:     Cervical: No cervical adenopathy.  Skin:    General: Skin is warm and dry.     Capillary Refill: Capillary refill takes less than 2 seconds.     Coloration: Skin  is not jaundiced or pale.     Findings: No bruising, erythema, lesion or rash.  Neurological:     General: No focal deficit present.     Mental Status: She is alert and oriented to person, place, and time. Mental status is at baseline.     Cranial Nerves: No cranial nerve deficit.     Sensory: No sensory deficit.     Motor: No weakness.     Coordination: Coordination normal.     Gait: Gait normal.     Deep Tendon Reflexes: Reflexes normal.  Psychiatric:        Mood and Affect: Mood normal.        Behavior: Behavior normal.        Thought Content: Thought content normal.        Judgment: Judgment normal.        02/20/2023    2:26 PM 10/31/2021   12:06 PM 08/13/2020    1:47 PM 05/30/2016    2:18 PM  6CIT Screen  What Year? 0 points 0 points 0 points 0 points  What month? 0 points 0 points 0 points 0 points  What time? 0 points 0 points 0 points 0 points  Count back from  20 0 points 0 points 2 points 0 points  Months in reverse 0 points 0 points 0 points 0 points  Repeat phrase 2 points 0 points 0 points 0 points  Total Score 2 points 0 points 2 points 0 points     Results for orders placed or performed in visit on 08/26/22  Rad Onc [facility] Session Summary   Collection Time: 08/26/22  3:55 PM  Result Value Ref Range   Course ID C2_Breast    Course Intent Curative    Course Start Date 07/21/2022  2:13 PM    Session Number 20    Course First Treatment Date 07/29/2022  3:46 PM    Course Last Treatment Date 08/26/2022  3:55 PM    Course Elapsed Days 28    Reference Point ID Breast_L_Bst DP    Reference Point Dosage Given to Date 10 Gy   Reference Point Session Dosage Given 2 Gy   Plan ID Brst_L_Bst_BH    Plan Name [id number]    Plan Fractions Treated to Date 5    Plan Total Fractions Prescribed 5    Plan Prescribed Dose Per Fraction 2 Gy   Plan Total Prescribed Dose 10.000000 Gy   Plan Primary Reference Point Breast_L_Bst DP       Assessment & Plan:   Problem List Items Addressed This Visit       Cardiovascular and Mediastinum   Essential hypertension   Under good control on current regimen. Continue current regimen. Continue to monitor. Call with any concerns. Refills given. Labs drawn today.        Relevant Medications   amLODipine  (NORVASC ) 5 MG tablet   lisinopril  (ZESTRIL ) 20 MG tablet   Other Relevant Orders   Microalbumin, Urine Waived   Comprehensive metabolic panel   TSH     Endocrine   Impaired fasting glucose/Prediabetes   Doing great with A1c of 5.5. Continue diet and exercise. Call with any concerns.       Relevant Orders   Comprehensive metabolic panel   Bayer DCA Hb J8r Waived     Other   Vitamin D  deficiency   Rechecking lab today. Await results. Treat as needed.       Relevant Orders   Comprehensive metabolic panel  VITAMIN D  25 Hydroxy (Vit-D Deficiency, Fractures)   B12 deficiency   Rechecking lab today.  Await results. Treat as needed.       Relevant Orders   CBC with Differential/Platelet   Comprehensive metabolic panel   A87   Pure hypercholesterolemia   Rechecking lab today. Await results. Treat as needed.       Relevant Medications   amLODipine  (NORVASC ) 5 MG tablet   lisinopril  (ZESTRIL ) 20 MG tablet   Other Relevant Orders   Comprehensive metabolic panel   Lipid Panel w/o Chol/HDL Ratio   Ductal carcinoma in situ (DCIS) of left breast   Due for mammo in May. Ordered today. Call with any concerns.       Other Visit Diagnoses       Encounter for Medicare annual wellness exam    -  Primary   Preventative care discussed today as below.     Routine general medical examination at a health care facility       Vaccines up to date. Screening labs checked today. Mammo ordered. Colonoscopy and DEXA up to date. Continue diet and exercise. Call with any concerns.     Encounter for screening mammogram for malignant neoplasm of breast       Mammogram ordered today.   Relevant Orders   MM 3D SCREENING MAMMOGRAM BILATERAL BREAST        Preventative Services:  Health Risk Assessment and Personalized Prevention Plan: done today Bone Mass Measurements: up tod ate Breast Cancer Screening: ordered today CVD Screening: done today Cervical Cancer Screening: N/A Colon Cancer Screening: due in December Depression Screening: done today Diabetes Screening: done today Glaucoma Screening: see eye doctor Hepatitis B vaccine: N/A Hepatitis C screening: up to date HIV Screening: up to date Flu Vaccine: up to date Lung cancer Screening: N/A Obesity Screening: Done today Pneumonia Vaccines (2): up to date STI Screening: N/A  Follow up plan: Return in about 6 months (around 08/20/2023).   LABORATORY TESTING:  - Pap smear: not applicable  IMMUNIZATIONS:   - Tdap: Tetanus vaccination status reviewed: last tetanus booster within 10 years. - Influenza: Up to date - Pneumovax: Up to  date - Prevnar: Up to date - Zostavax vaccine: Refused  SCREENING: -Mammogram: Ordered today  - Colonoscopy: Up to date  - Bone Density: Up to date   PATIENT COUNSELING:   Advised to take 1 mg of folate supplement per day if capable of pregnancy.   Sexuality: Discussed sexually transmitted diseases, partner selection, use of condoms, avoidance of unintended pregnancy  and contraceptive alternatives.   Advised to avoid cigarette smoking.  I discussed with the patient that most people either abstain from alcohol or drink within safe limits (<=14/week and <=4 drinks/occasion for males, <=7/weeks and <= 3 drinks/occasion for females) and that the risk for alcohol disorders and other health effects rises proportionally with the number of drinks per week and how often a drinker exceeds daily limits.  Discussed cessation/primary prevention of drug use and availability of treatment for abuse.   Diet: Encouraged to adjust caloric intake to maintain  or achieve ideal body weight, to reduce intake of dietary saturated fat and total fat, to limit sodium intake by avoiding high sodium foods and not adding table salt, and to maintain adequate dietary potassium and calcium preferably from fresh fruits, vegetables, and low-fat dairy products.    stressed the importance of regular exercise  Injury prevention: Discussed safety belts, safety helmets, smoke detector, smoking near bedding or upholstery.  Dental health: Discussed importance of regular tooth brushing, flossing, and dental visits.    NEXT PREVENTATIVE PHYSICAL DUE IN 1 YEAR. Return in about 6 months (around 08/20/2023).

## 2023-02-20 NOTE — Patient Instructions (Signed)
 Preventative Services:  Health Risk Assessment and Personalized Prevention Plan: done today Bone Mass Measurements: up tod ate Breast Cancer Screening: ordered today CVD Screening: done today Cervical Cancer Screening: N/A Colon Cancer Screening: due in December Depression Screening: done today Diabetes Screening: done today Glaucoma Screening: see eye doctor Hepatitis B vaccine: N/A Hepatitis C screening: up to date HIV Screening: up to date Flu Vaccine: up to date Lung cancer Screening: N/A Obesity Screening: Done today Pneumonia Vaccines (2): up to date STI Screening: N/A

## 2023-02-21 LAB — COMPREHENSIVE METABOLIC PANEL
ALT: 24 [IU]/L (ref 0–32)
AST: 27 [IU]/L (ref 0–40)
Albumin: 4.4 g/dL (ref 3.8–4.8)
Alkaline Phosphatase: 95 [IU]/L (ref 44–121)
BUN/Creatinine Ratio: 13 (ref 12–28)
BUN: 11 mg/dL (ref 8–27)
Bilirubin Total: 0.4 mg/dL (ref 0.0–1.2)
CO2: 19 mmol/L — ABNORMAL LOW (ref 20–29)
Calcium: 9.6 mg/dL (ref 8.7–10.3)
Chloride: 94 mmol/L — ABNORMAL LOW (ref 96–106)
Creatinine, Ser: 0.85 mg/dL (ref 0.57–1.00)
Globulin, Total: 2.3 g/dL (ref 1.5–4.5)
Glucose: 74 mg/dL (ref 70–99)
Potassium: 4.5 mmol/L (ref 3.5–5.2)
Sodium: 136 mmol/L (ref 134–144)
Total Protein: 6.7 g/dL (ref 6.0–8.5)
eGFR: 72 mL/min/{1.73_m2} (ref >59)

## 2023-02-21 LAB — LIPID PANEL W/O CHOL/HDL RATIO
Cholesterol, Total: 283 mg/dL — ABNORMAL HIGH (ref 100–199)
HDL: 79 mg/dL (ref >39)
LDL Chol Calc (NIH): 189 mg/dL — ABNORMAL HIGH (ref 0–99)
Triglycerides: 91 mg/dL (ref 0–149)
VLDL Cholesterol Cal: 15 mg/dL (ref 5–40)

## 2023-02-21 LAB — CBC WITH DIFFERENTIAL/PLATELET
Basophils Absolute: 0 10*3/uL (ref 0.0–0.2)
Basos: 1 %
EOS (ABSOLUTE): 0.1 10*3/uL (ref 0.0–0.4)
Eos: 1 %
Hematocrit: 41.6 % (ref 34.0–46.6)
Hemoglobin: 13.9 g/dL (ref 11.1–15.9)
Immature Grans (Abs): 0 10*3/uL (ref 0.0–0.1)
Immature Granulocytes: 1 %
Lymphocytes Absolute: 1.1 10*3/uL (ref 0.7–3.1)
Lymphs: 21 %
MCH: 31.9 pg (ref 26.6–33.0)
MCHC: 33.4 g/dL (ref 31.5–35.7)
MCV: 95 fL (ref 79–97)
Monocytes Absolute: 0.7 10*3/uL (ref 0.1–0.9)
Monocytes: 12 %
Neutrophils Absolute: 3.6 10*3/uL (ref 1.4–7.0)
Neutrophils: 64 %
Platelets: 231 10*3/uL (ref 150–450)
RBC: 4.36 x10E6/uL (ref 3.77–5.28)
RDW: 11.3 % — ABNORMAL LOW (ref 11.7–15.4)
WBC: 5.5 10*3/uL (ref 3.4–10.8)

## 2023-02-21 LAB — TSH: TSH: 1.03 u[IU]/mL (ref 0.450–4.500)

## 2023-02-21 LAB — VITAMIN D 25 HYDROXY (VIT D DEFICIENCY, FRACTURES): Vit D, 25-Hydroxy: 22.7 ng/mL — ABNORMAL LOW (ref 30.0–100.0)

## 2023-02-21 LAB — VITAMIN B12: Vitamin B-12: 692 pg/mL (ref 232–1245)

## 2023-02-22 ENCOUNTER — Encounter: Payer: Self-pay | Admitting: Family Medicine

## 2023-03-02 ENCOUNTER — Other Ambulatory Visit (HOSPITAL_COMMUNITY): Payer: Self-pay

## 2023-04-01 ENCOUNTER — Other Ambulatory Visit: Payer: Self-pay

## 2023-04-03 ENCOUNTER — Other Ambulatory Visit: Payer: Self-pay

## 2023-04-07 ENCOUNTER — Encounter: Payer: Self-pay | Admitting: *Deleted

## 2023-04-08 ENCOUNTER — Other Ambulatory Visit (HOSPITAL_COMMUNITY): Payer: Self-pay

## 2023-04-08 MED ORDER — ACETAMINOPHEN-CODEINE 300-30 MG PO TABS
1.0000 | ORAL_TABLET | Freq: Four times a day (QID) | ORAL | 0 refills | Status: DC | PRN
  Filled 2023-04-08: qty 16, 4d supply, fill #0

## 2023-04-08 MED ORDER — AMOXICILLIN 500 MG PO CAPS
ORAL_CAPSULE | ORAL | 0 refills | Status: DC
  Filled 2023-04-08: qty 31, 10d supply, fill #0

## 2023-05-22 ENCOUNTER — Encounter: Payer: Self-pay | Admitting: Family Medicine

## 2023-08-21 ENCOUNTER — Ambulatory Visit: Payer: PPO | Admitting: Family Medicine

## 2023-08-24 ENCOUNTER — Ambulatory Visit: Admitting: Family Medicine

## 2023-08-27 ENCOUNTER — Other Ambulatory Visit: Payer: Self-pay

## 2023-08-27 ENCOUNTER — Other Ambulatory Visit (HOSPITAL_COMMUNITY): Payer: Self-pay

## 2023-08-27 NOTE — Progress Notes (Signed)
 Pharmacy Quality Measure Review  This patient is appearing on a report for being at risk of failing the adherence measure for hypertension (ACEi/ARB) medications this calendar year.   Medication: lisinopril  20 mg daily Last fill date: 04/03/2023 for 90 day supply  Left voicemail for patient to return my call at their convenience.  Woodie Jock, PharmD PGY1 Pharmacy Resident

## 2023-08-29 ENCOUNTER — Other Ambulatory Visit (HOSPITAL_COMMUNITY): Payer: Self-pay

## 2023-09-11 ENCOUNTER — Other Ambulatory Visit (HOSPITAL_COMMUNITY): Payer: Self-pay

## 2023-10-02 ENCOUNTER — Other Ambulatory Visit: Payer: Self-pay | Admitting: Family Medicine

## 2023-10-02 DIAGNOSIS — Z853 Personal history of malignant neoplasm of breast: Secondary | ICD-10-CM

## 2023-10-09 ENCOUNTER — Ambulatory Visit
Admission: RE | Admit: 2023-10-09 | Discharge: 2023-10-09 | Disposition: A | Discharge status: Home / Self Care | Source: Ambulatory Visit | Attending: Family Medicine | Admitting: Family Medicine

## 2023-10-09 DIAGNOSIS — R928 Other abnormal and inconclusive findings on diagnostic imaging of breast: Secondary | ICD-10-CM | POA: Diagnosis not present

## 2023-10-09 DIAGNOSIS — Z853 Personal history of malignant neoplasm of breast: Secondary | ICD-10-CM

## 2023-10-12 ENCOUNTER — Ambulatory Visit: Payer: Self-pay | Admitting: Family Medicine

## 2023-10-14 ENCOUNTER — Telehealth (HOSPITAL_COMMUNITY): Payer: Self-pay

## 2023-10-14 ENCOUNTER — Other Ambulatory Visit (HOSPITAL_COMMUNITY): Payer: Self-pay

## 2023-10-14 ENCOUNTER — Encounter: Payer: Self-pay | Admitting: Family Medicine

## 2023-10-14 ENCOUNTER — Ambulatory Visit (INDEPENDENT_AMBULATORY_CARE_PROVIDER_SITE_OTHER): Admitting: Family Medicine

## 2023-10-14 ENCOUNTER — Other Ambulatory Visit (HOSPITAL_BASED_OUTPATIENT_CLINIC_OR_DEPARTMENT_OTHER): Payer: Self-pay

## 2023-10-14 VITALS — BP 124/76 | HR 76 | Temp 97.6°F | Ht 61.25 in | Wt 174.6 lb

## 2023-10-14 DIAGNOSIS — E559 Vitamin D deficiency, unspecified: Secondary | ICD-10-CM

## 2023-10-14 DIAGNOSIS — Z23 Encounter for immunization: Secondary | ICD-10-CM | POA: Diagnosis not present

## 2023-10-14 DIAGNOSIS — B351 Tinea unguium: Secondary | ICD-10-CM

## 2023-10-14 DIAGNOSIS — E538 Deficiency of other specified B group vitamins: Secondary | ICD-10-CM | POA: Diagnosis not present

## 2023-10-14 DIAGNOSIS — I10 Essential (primary) hypertension: Secondary | ICD-10-CM | POA: Diagnosis not present

## 2023-10-14 DIAGNOSIS — E7800 Pure hypercholesterolemia, unspecified: Secondary | ICD-10-CM

## 2023-10-14 DIAGNOSIS — R7301 Impaired fasting glucose: Secondary | ICD-10-CM | POA: Diagnosis not present

## 2023-10-14 LAB — BAYER DCA HB A1C WAIVED: HB A1C (BAYER DCA - WAIVED): 5.4 % (ref 4.8–5.6)

## 2023-10-14 MED ORDER — AMLODIPINE BESYLATE 5 MG PO TABS
5.0000 mg | ORAL_TABLET | Freq: Every day | ORAL | 1 refills | Status: AC
  Filled 2023-10-14 – 2024-01-22 (×2): qty 90, 90d supply, fill #0

## 2023-10-14 MED ORDER — CICLOPIROX 8 % EX SOLN
Freq: Every day | CUTANEOUS | 12 refills | Status: DC
  Filled 2023-10-14: qty 6.6, 30d supply, fill #0

## 2023-10-14 MED ORDER — LISINOPRIL 20 MG PO TABS
20.0000 mg | ORAL_TABLET | Freq: Every day | ORAL | 1 refills | Status: AC
  Filled 2023-10-14 – 2024-01-22 (×2): qty 90, 90d supply, fill #0

## 2023-10-14 NOTE — Assessment & Plan Note (Signed)
 Rechecking labs today. Await results. Treat as needed.

## 2023-10-14 NOTE — Assessment & Plan Note (Signed)
 Doing great with A1c of 5.4. Not in the prediabetic range. Will resolve off her problem list.

## 2023-10-14 NOTE — Telephone Encounter (Signed)
 Pharmacy Patient Advocate Encounter   Received notification from Pt Calls Messages that prior authorization for Ciclopirox 8% solution  is required/requested.   Insurance verification completed.   The patient is insured through [facility] ADVANTAGE/RX ADVANCE.   Per test claim: PA required; PA started via CoverMyMeds. KEY [id number] . Please see clinical question(s) below that I am not finding the answer to in their chart and advise.  *diagnosis code and chart notes are required in order to submit the prior auth

## 2023-10-14 NOTE — Progress Notes (Signed)
 BP 124/76   Pulse 76   Temp 97.6 F (36.4 C) (Oral)   Ht 5' 1.25 (1.556 m)   Wt 174 lb 9.6 oz (79.2 kg)   SpO2 98%   BMI 32.72 kg/m    Subjective:    Patient ID: Kimberly Klein, female    DOB: Dec 07, 1950, 73 y.o.   MRN: [id number]  HPI: Kimberly Klein is a 73 y.o. female  Chief Complaint  Patient presents with  . Hypertension   HYPERTENSION / HYPERLIPIDEMIA Satisfied with current treatment? yes Duration of hypertension: chronic BP monitoring frequency: not checking BP medication side effects: no Past BP meds: amlodipine , lisinopril  Duration of hyperlipidemia: chronic Cholesterol medication side effects: no Cholesterol supplements: none Past cholesterol medications: none Medication compliance: excellent compliance Aspirin: no Recent stressors: no Recurrent headaches: no Visual changes: no Palpitations: no Dyspnea: no Chest pain: no Lower extremity edema: no Dizzy/lightheaded: no  Impaired Fasting Glucose HbA1C:  Lab Results  Component Value Date   HGBA1C 5.4 10/14/2023   Duration of elevated blood sugar: chroinc Polydipsia: no Polyuria: no Weight change: yes- with effort Visual disturbance: no Glucose Monitoring: no Diabetic Education: Completed Family history of diabetes: yes  Thickened toenails. Would like to do something about it. No other concerns or complaints.   Relevant past medical, surgical, family and social history reviewed and updated as indicated. Interim medical history since our last visit reviewed. Allergies and medications reviewed and updated.  Review of Systems  Constitutional: Negative.   Respiratory: Negative.    Cardiovascular: Negative.   Musculoskeletal: Negative.   Neurological: Negative.   Psychiatric/Behavioral: Negative.      Per HPI unless specifically indicated above     Objective:    BP 124/76   Pulse 76   Temp 97.6 F (36.4 C) (Oral)   Ht 5' 1.25 (1.556 m)   Wt 174 lb 9.6 oz (79.2 kg)   SpO2 98%    BMI 32.72 kg/m   Wt Readings from Last 3 Encounters:  10/14/23 174 lb 9.6 oz (79.2 kg)  02/20/23 179 lb 9.6 oz (81.5 kg)  10/13/22 184 lb 3.2 oz (83.6 kg)    Physical Exam Vitals and nursing note reviewed.  Constitutional:      General: She is not in acute distress.    Appearance: Normal appearance. She is not ill-appearing, toxic-appearing or diaphoretic.  HENT:     Head: Normocephalic and atraumatic.     Right Ear: External ear normal.     Left Ear: External ear normal.     Nose: Nose normal.     Mouth/Throat:     Mouth: Mucous membranes are moist.     Pharynx: Oropharynx is clear.  Eyes:     General: No scleral icterus.       Right eye: No discharge.        Left eye: No discharge.     Extraocular Movements: Extraocular movements intact.     Conjunctiva/sclera: Conjunctivae normal.     Pupils: Pupils are equal, round, and reactive to light.  Cardiovascular:     Rate and Rhythm: Normal rate and regular rhythm.     Pulses: Normal pulses.     Heart sounds: Normal heart sounds. No murmur heard.    No friction rub. No gallop.  Pulmonary:     Effort: Pulmonary effort is normal. No respiratory distress.     Breath sounds: Normal breath sounds. No stridor. No wheezing, rhonchi or rales.  Chest:     Chest  wall: No tenderness.  Musculoskeletal:        General: Normal range of motion.     Cervical back: Normal range of motion and neck supple.  Skin:    General: Skin is warm and dry.     Capillary Refill: Capillary refill takes less than 2 seconds.     Coloration: Skin is not jaundiced or pale.     Findings: No bruising, erythema, lesion or rash.  Neurological:     General: No focal deficit present.     Mental Status: She is alert and oriented to person, place, and time. Mental status is at baseline.  Psychiatric:        Mood and Affect: Mood normal.        Behavior: Behavior normal.        Thought Content: Thought content normal.        Judgment: Judgment normal.      Results for orders placed or performed in visit on 10/14/23  Bayer DCA Hb A1c Waived   Collection Time: 10/14/23 11:25 AM  Result Value Ref Range   HB A1C (BAYER DCA - WAIVED) 5.4 4.8 - 5.6 %  CBC with Differential/Platelet   Collection Time: 10/14/23 11:26 AM  Result Value Ref Range   WBC 4.8 3.4 - 10.8 x10E3/uL   RBC 4.56 3.77 - 5.28 x10E6/uL   Hemoglobin 14.4 11.1 - 15.9 g/dL   Hematocrit 55.5 65.9 - 46.6 %   MCV 97 79 - 97 fL   MCH 31.6 26.6 - 33.0 pg   MCHC 32.4 31.5 - 35.7 g/dL   RDW 87.9 88.2 - 84.5 %   Platelets 211 150 - 450 x10E3/uL   Neutrophils 64 Not Estab. %   Lymphs 21 Not Estab. %   Monocytes 12 Not Estab. %   Eos 1 Not Estab. %   Basos 1 Not Estab. %   Neutrophils Absolute 3.2 1.4 - 7.0 x10E3/uL   Lymphocytes Absolute 1.0 0.7 - 3.1 x10E3/uL   Monocytes Absolute 0.6 0.1 - 0.9 x10E3/uL   EOS (ABSOLUTE) 0.1 0.0 - 0.4 x10E3/uL   Basophils Absolute 0.0 0.0 - 0.2 x10E3/uL   Immature Granulocytes 0 Not Estab. %   Immature Grans (Abs) 0.0 0.0 - 0.1 x10E3/uL  Comprehensive metabolic panel with GFR   Collection Time: 10/14/23 11:26 AM  Result Value Ref Range   Glucose 100 (H) 70 - 99 mg/dL   BUN 10 8 - 27 mg/dL   Creatinine, Ser 9.19 0.57 - 1.00 mg/dL   eGFR 77 >40 fO/fpw/8.26   BUN/Creatinine Ratio 13 12 - 28   Sodium 139 134 - 144 mmol/L   Potassium 4.2 3.5 - 5.2 mmol/L   Chloride 100 96 - 106 mmol/L   CO2 23 20 - 29 mmol/L   Calcium 9.5 8.7 - 10.3 mg/dL   Total Protein 6.5 6.0 - 8.5 g/dL   Albumin 4.4 3.8 - 4.8 g/dL   Globulin, Total 2.1 1.5 - 4.5 g/dL   Bilirubin Total 0.4 0.0 - 1.2 mg/dL   Alkaline Phosphatase 102 49 - 135 IU/L   AST 18 0 - 40 IU/L   ALT 20 0 - 32 IU/L  Lipid Panel w/o Chol/HDL Ratio   Collection Time: 10/14/23 11:26 AM  Result Value Ref Range   Cholesterol, Total 246 (H) 100 - 199 mg/dL   Triglycerides 84 0 - 149 mg/dL   HDL 67 >60 mg/dL   VLDL Cholesterol Cal 14 5 - 40 mg/dL   LDL Chol  Calc (NIH) 165 (H) 0 - 99 mg/dL   VITAMIN D  25 Hydroxy (Vit-D Deficiency, Fractures)   Collection Time: 10/14/23 11:26 AM  Result Value Ref Range   Vit D, 25-Hydroxy 23.0 (L) 30.0 - 100.0 ng/mL  B12   Collection Time: 10/14/23 11:26 AM  Result Value Ref Range   Vitamin B-12 359 232 - 1,245 pg/mL      Assessment & Plan:   Problem List Items Addressed This Visit       Cardiovascular and Mediastinum   Essential hypertension - Primary   Under good control on current regimen. Continue current regimen. Continue to monitor. Call with any concerns. Refills given. Labs drawn today.        Relevant Medications   amLODipine  (NORVASC ) 5 MG tablet   lisinopril  (ZESTRIL ) 20 MG tablet   Other Relevant Orders   CBC with Differential/Platelet (Completed)   Comprehensive metabolic panel with GFR (Completed)     Endocrine   Impaired fasting glucose/Prediabetes   Doing great with A1c of 5.4. Not in the prediabetic range. Will resolve off her problem list.       Relevant Orders   CBC with Differential/Platelet (Completed)   Comprehensive metabolic panel with GFR (Completed)   Bayer DCA Hb A1c Waived (Completed)     Other   Vitamin D  deficiency   Rechecking labs today. Await results. Treat as needed.       Relevant Orders   CBC with Differential/Platelet (Completed)   Comprehensive metabolic panel with GFR (Completed)   VITAMIN D  25 Hydroxy (Vit-D Deficiency, Fractures) (Completed)   B12 deficiency   Rechecking labs today. Await results. Treat as needed.       Relevant Orders   CBC with Differential/Platelet (Completed)   Comprehensive metabolic panel with GFR (Completed)   B12 (Completed)   Pure hypercholesterolemia   Rechecking labs today. Await results. Treat as needed.       Relevant Medications   amLODipine  (NORVASC ) 5 MG tablet   lisinopril  (ZESTRIL ) 20 MG tablet   Other Relevant Orders   CBC with Differential/Platelet (Completed)   Comprehensive metabolic panel with GFR (Completed)   Lipid Panel  w/o Chol/HDL Ratio (Completed)   Other Visit Diagnoses       Onychomycosis       Will treat with penlac. Call with any concerns.   Relevant Medications   ciclopirox (PENLAC) 8 % solution     Need for COVID-19 vaccine       Covid shot given today.   Relevant Orders   Pfizer Comirnaty  Covid -19 Vaccine 9yrs and older (Completed)        Follow up plan: Return in about 6 months (around 04/13/2024) for physical.

## 2023-10-14 NOTE — Assessment & Plan Note (Signed)
 Under good control on current regimen. Continue current regimen. Continue to monitor. Call with any concerns. Refills given. Labs drawn today.

## 2023-10-14 NOTE — Telephone Encounter (Signed)
 PA request has been Received. New Encounter has been or will be created for follow up. For additional info see Pharmacy Prior Auth telephone encounter from 10/14/23.

## 2023-10-15 LAB — COMPREHENSIVE METABOLIC PANEL WITH GFR
ALT: 20 IU/L (ref 0–32)
AST: 18 IU/L (ref 0–40)
Albumin: 4.4 g/dL (ref 3.8–4.8)
Alkaline Phosphatase: 102 IU/L (ref 49–135)
BUN/Creatinine Ratio: 13 (ref 12–28)
BUN: 10 mg/dL (ref 8–27)
Bilirubin Total: 0.4 mg/dL (ref 0.0–1.2)
CO2: 23 mmol/L (ref 20–29)
Calcium: 9.5 mg/dL (ref 8.7–10.3)
Chloride: 100 mmol/L (ref 96–106)
Creatinine, Ser: 0.8 mg/dL (ref 0.57–1.00)
Globulin, Total: 2.1 g/dL (ref 1.5–4.5)
Glucose: 100 mg/dL — ABNORMAL HIGH (ref 70–99)
Potassium: 4.2 mmol/L (ref 3.5–5.2)
Sodium: 139 mmol/L (ref 134–144)
Total Protein: 6.5 g/dL (ref 6.0–8.5)
eGFR: 77 mL/min/1.73 (ref >59)

## 2023-10-15 LAB — CBC WITH DIFFERENTIAL/PLATELET
Basophils Absolute: 0 x10E3/uL (ref 0.0–0.2)
Basos: 1 %
EOS (ABSOLUTE): 0.1 x10E3/uL (ref 0.0–0.4)
Eos: 1 %
Hematocrit: 44.4 % (ref 34.0–46.6)
Hemoglobin: 14.4 g/dL (ref 11.1–15.9)
Immature Grans (Abs): 0 x10E3/uL (ref 0.0–0.1)
Immature Granulocytes: 0 %
Lymphocytes Absolute: 1 x10E3/uL (ref 0.7–3.1)
Lymphs: 21 %
MCH: 31.6 pg (ref 26.6–33.0)
MCHC: 32.4 g/dL (ref 31.5–35.7)
MCV: 97 fL (ref 79–97)
Monocytes Absolute: 0.6 x10E3/uL (ref 0.1–0.9)
Monocytes: 12 %
Neutrophils Absolute: 3.2 x10E3/uL (ref 1.4–7.0)
Neutrophils: 64 %
Platelets: 211 x10E3/uL (ref 150–450)
RBC: 4.56 x10E6/uL (ref 3.77–5.28)
RDW: 12 % (ref 11.7–15.4)
WBC: 4.8 x10E3/uL (ref 3.4–10.8)

## 2023-10-15 LAB — LIPID PANEL W/O CHOL/HDL RATIO
Cholesterol, Total: 246 mg/dL — ABNORMAL HIGH (ref 100–199)
HDL: 67 mg/dL (ref >39)
LDL Chol Calc (NIH): 165 mg/dL — ABNORMAL HIGH (ref 0–99)
Triglycerides: 84 mg/dL (ref 0–149)
VLDL Cholesterol Cal: 14 mg/dL (ref 5–40)

## 2023-10-15 LAB — VITAMIN B12: Vitamin B-12: 359 pg/mL (ref 232–1245)

## 2023-10-15 LAB — VITAMIN D 25 HYDROXY (VIT D DEFICIENCY, FRACTURES): Vit D, 25-Hydroxy: 23 ng/mL — ABNORMAL LOW (ref 30.0–100.0)

## 2023-10-15 NOTE — Progress Notes (Signed)
   10/15/2023  Patient ID: Kimberly Klein, female   DOB: 05-Jan-1951, 73 y.o.   MRN: [id number]  This patient is appearing on a report for being at risk of failing the adherence measure for hypertension (ACEi/ARB) medications this calendar year.   Medication: lisinopril  20 mg tablets Last fill date: 09/11/2023 for 90 day supply  Insurance report was not up to date. No action needed at this time.   Carson Bogden C. Rheanne Cortopassi [facility] PharmD Candidate Class of [phone]

## 2023-10-15 NOTE — Telephone Encounter (Signed)
 Addended.

## 2023-10-16 ENCOUNTER — Ambulatory Visit: Payer: Self-pay | Admitting: Family Medicine

## 2023-10-16 NOTE — Telephone Encounter (Signed)
 Pharmacy Patient Advocate Encounter  Received notification from [facility] [employer] that Prior Authorization for Ciclopirox 8% solution  has been DENIED.  Full denial letter will be uploaded to the media tab. See denial reason below.   PA #/Case ID/Reference #: [id number]

## 2023-10-16 NOTE — Telephone Encounter (Signed)
 Pharmacy Patient Advocate Encounter   Received notification from Pt Calls Messages that prior authorization for Ciclopirox 8% solution  is required/requested.   Insurance verification completed.   The patient is insured through [facility] [employer]/RX ADVANCE.   Per test claim: PA required; PA submitted to above mentioned insurance via Latent Key/confirmation #/EOC [id number] Status is pending

## 2023-10-17 ENCOUNTER — Other Ambulatory Visit (HOSPITAL_COMMUNITY): Payer: Self-pay

## 2023-10-19 NOTE — Telephone Encounter (Signed)
 Please let patient know below. I can refer her podiatry or she can use a good Rx card for it for about $60. Just let me know.

## 2023-10-20 ENCOUNTER — Other Ambulatory Visit: Payer: Self-pay

## 2023-10-20 ENCOUNTER — Encounter: Payer: Self-pay | Admitting: Family Medicine

## 2023-10-20 ENCOUNTER — Other Ambulatory Visit (HOSPITAL_COMMUNITY): Payer: Self-pay

## 2023-10-20 ENCOUNTER — Other Ambulatory Visit: Payer: Self-pay | Admitting: Family Medicine

## 2023-10-20 MED ORDER — NYSTATIN-TRIAMCINOLONE 100000-0.1 UNIT/GM-% EX OINT
1.0000 | TOPICAL_OINTMENT | Freq: Two times a day (BID) | CUTANEOUS | 0 refills | Status: AC
  Filled 2023-10-20: qty 30, 15d supply, fill #0

## 2023-10-20 MED ORDER — CICLOPIROX 8 % EX SOLN: Freq: Every day | CUTANEOUS | 12 refills | Status: AC

## 2023-10-27 ENCOUNTER — Encounter: Payer: Self-pay | Admitting: Family Medicine

## 2023-11-02 ENCOUNTER — Other Ambulatory Visit (HOSPITAL_COMMUNITY): Payer: Self-pay

## 2023-11-02 MED ORDER — FLUCONAZOLE 100 MG PO TABS
100.0000 mg | ORAL_TABLET | Freq: Every day | ORAL | 0 refills | Status: AC
  Filled 2023-11-02: qty 7, 7d supply, fill #0

## 2023-11-24 ENCOUNTER — Telehealth: Payer: Self-pay

## 2023-11-24 NOTE — Progress Notes (Signed)
   11/24/2023  Patient ID: [name], female   DOB: Dec 03, 1950, 73 y.o.   MRN: [id number]  This patient is appearing on a report for being at risk of failing the adherence measure for hypertension (ACEi/ARB) medications this calendar year.   Medication: lisinopril  20mg  daily  Last fill date: 09/11/2023 for 90 day supply; prior to that 04/03/2023 for a 90 day supply   MyChart message sent to patient.  Channing DELENA Mealing, PharmD, DPLA

## 2023-12-02 ENCOUNTER — Other Ambulatory Visit (HOSPITAL_COMMUNITY): Payer: Self-pay

## 2023-12-02 MED ORDER — AZITHROMYCIN 250 MG PO TABS
ORAL_TABLET | ORAL | 0 refills | Status: AC
  Filled 2023-12-02: qty 6, 5d supply, fill #0

## 2023-12-04 ENCOUNTER — Encounter (INDEPENDENT_AMBULATORY_CARE_PROVIDER_SITE_OTHER): Payer: PPO | Admitting: Ophthalmology

## 2024-01-22 ENCOUNTER — Other Ambulatory Visit (HOSPITAL_COMMUNITY): Payer: Self-pay

## 2024-04-13 ENCOUNTER — Encounter: Admitting: Family Medicine
